# Patient Record
Sex: Male | Born: 1945 | Race: White | Hispanic: No | Marital: Married | Smoking: Current every day smoker
Health system: Southern US, Community
[De-identification: ages and names within clinical notes are randomized; demographics above are authoritative.]

## PROBLEM LIST (undated history)

## (undated) DIAGNOSIS — I714 Abdominal aortic aneurysm, without rupture, unspecified: Secondary | ICD-10-CM

## (undated) DIAGNOSIS — J449 Chronic obstructive pulmonary disease, unspecified: Secondary | ICD-10-CM

## (undated) DIAGNOSIS — I10 Essential (primary) hypertension: Secondary | ICD-10-CM

## (undated) DIAGNOSIS — C679 Malignant neoplasm of bladder, unspecified: Secondary | ICD-10-CM

## (undated) HISTORY — PX: PROSTATECTOMY: SHX69

## (undated) HISTORY — DX: Chronic obstructive pulmonary disease, unspecified: J44.9

## (undated) HISTORY — PX: ABDOMINAL AORTIC ANEURYSM REPAIR: SUR1152

---

## 2003-11-16 ENCOUNTER — Encounter (INDEPENDENT_AMBULATORY_CARE_PROVIDER_SITE_OTHER): Payer: Self-pay | Admitting: Specialist

## 2003-11-16 ENCOUNTER — Observation Stay (HOSPITAL_COMMUNITY): Admission: RE | Admit: 2003-11-16 | Discharge: 2003-11-17 | Payer: Self-pay | Admitting: Urology

## 2003-12-14 ENCOUNTER — Encounter (INDEPENDENT_AMBULATORY_CARE_PROVIDER_SITE_OTHER): Payer: Self-pay | Admitting: *Deleted

## 2003-12-14 ENCOUNTER — Inpatient Hospital Stay (HOSPITAL_COMMUNITY): Admission: RE | Admit: 2003-12-14 | Discharge: 2003-12-16 | Payer: Self-pay | Admitting: Urology

## 2004-11-23 ENCOUNTER — Ambulatory Visit: Admission: RE | Admit: 2004-11-23 | Discharge: 2005-01-07 | Payer: Self-pay | Admitting: Radiation Oncology

## 2004-12-26 ENCOUNTER — Ambulatory Visit (HOSPITAL_COMMUNITY): Admission: RE | Admit: 2004-12-26 | Discharge: 2004-12-26 | Payer: Self-pay | Admitting: Urology

## 2004-12-26 ENCOUNTER — Ambulatory Visit (HOSPITAL_BASED_OUTPATIENT_CLINIC_OR_DEPARTMENT_OTHER): Admission: RE | Admit: 2004-12-26 | Discharge: 2004-12-26 | Payer: Self-pay | Admitting: Urology

## 2004-12-26 ENCOUNTER — Encounter (INDEPENDENT_AMBULATORY_CARE_PROVIDER_SITE_OTHER): Payer: Self-pay | Admitting: Specialist

## 2005-07-10 ENCOUNTER — Encounter (INDEPENDENT_AMBULATORY_CARE_PROVIDER_SITE_OTHER): Payer: Self-pay | Admitting: Specialist

## 2005-07-10 ENCOUNTER — Ambulatory Visit (HOSPITAL_BASED_OUTPATIENT_CLINIC_OR_DEPARTMENT_OTHER): Admission: RE | Admit: 2005-07-10 | Discharge: 2005-07-10 | Payer: Self-pay | Admitting: Urology

## 2005-07-10 ENCOUNTER — Ambulatory Visit (HOSPITAL_COMMUNITY): Admission: RE | Admit: 2005-07-10 | Discharge: 2005-07-10 | Payer: Self-pay | Admitting: Urology

## 2005-08-14 ENCOUNTER — Encounter (HOSPITAL_COMMUNITY): Admission: RE | Admit: 2005-08-14 | Discharge: 2005-10-29 | Payer: Self-pay | Admitting: Urology

## 2005-09-17 ENCOUNTER — Ambulatory Visit: Admission: RE | Admit: 2005-09-17 | Discharge: 2005-10-25 | Payer: Self-pay | Admitting: Radiation Oncology

## 2007-05-06 ENCOUNTER — Encounter (INDEPENDENT_AMBULATORY_CARE_PROVIDER_SITE_OTHER): Payer: Self-pay | Admitting: Urology

## 2007-05-06 ENCOUNTER — Ambulatory Visit (HOSPITAL_BASED_OUTPATIENT_CLINIC_OR_DEPARTMENT_OTHER): Admission: RE | Admit: 2007-05-06 | Discharge: 2007-05-06 | Payer: Self-pay | Admitting: Urology

## 2007-11-09 ENCOUNTER — Encounter (INDEPENDENT_AMBULATORY_CARE_PROVIDER_SITE_OTHER): Payer: Self-pay | Admitting: Urology

## 2007-11-09 ENCOUNTER — Ambulatory Visit (HOSPITAL_BASED_OUTPATIENT_CLINIC_OR_DEPARTMENT_OTHER): Admission: RE | Admit: 2007-11-09 | Discharge: 2007-11-09 | Payer: Self-pay | Admitting: Urology

## 2011-03-19 NOTE — Op Note (Signed)
NAME:  Daniel King, Daniel King NO.:  [id number]   MEDICAL RECORD NO.:  [id number]          PATIENT TYPE:  AMB   LOCATION:  [facility]                         FACILITY:  [facility]   PHYSICIAN:  Valetta Fuller, M.D.  DATE OF BIRTH:  14-Aug-1946   DATE OF PROCEDURE:  05/06/2007  DATE OF DISCHARGE:                               OPERATIVE REPORT   PREOPERATIVE DIAGNOSIS:  History of transitional cell carcinoma bladder.   POSTOPERATIVE DIAGNOSIS:  History of transitional cell carcinoma  bladder.   PROCEDURE PERFORMED:  Cystoscopy with bladder washings, bladder biopsy  x3 with fulguration.   SURGEON:  Valetta Fuller, M.D.   ANESTHESIA:  General.   INDICATIONS:  Mr. Klem is a 65 year old male with a very complex  urologic history.  This includes a history of adenocarcinoma of the  prostate as well as transitional cell carcinoma of the bladder.  The  patient initially had a transitional cell carcinoma of the bladder noted  about 3 to 3-1/2 years ago.  The patient also subsequently developed a  recurrence and had some carcinoma in situ.  He has received an induction  course of BCG and has had several maintenance courses.  Recent  cystoscopy was unremarkable except one area of increased erythema and  bladder wall thickening near the junction of the left lateral wall and  posterior wall.  We felt that area needed to be biopsied.   TECHNIQUE AND FINDINGS:  The patient was brought to the operating room,  where he had successful induction of general anesthesia.  He was placed  in the lithotomy position and prepped and draped in the usual manner.  Initial cystoscopy revealed some narrowing in his penile urethra.  For  that reason we took out the cystoscope and used [employer] sounds to  perform urethral dilation from 18 to 24 [place] and then placed the  22-  French cystoscope without difficulty.  The patient has had his prostate  removed.  The urethra otherwise showed no evidence of any  abnormalities  and the bladder neck was open and clear.  The patient's bladder was  carefully inspected and appeared normal except again at the junction of  the posterior wall and left lateral wall.  This area was cold-cup  biopsied and then fulgurated.  Random biopsies were taken of both  lateral walls.  A bladder barbotage with saline was also done for  urinary cytology.  The patient appeared to tolerate the procedure well.  There were no obvious complications or difficulties.  He was brought to  the recovery room in stable condition.           ______________________________  Valetta Fuller, M.D.  Electronically Signed     DSG/MEDQ  D:  05/06/2007  T:  05/06/2007  Job:  [id number]

## 2011-03-19 NOTE — Op Note (Signed)
NAME:  CLEE, PANDIT NO.:  [id number]   MEDICAL RECORD NO.:  [id number]         PATIENT TYPE:  HAMB   LOCATION:                               FACILITY:  [facility]   PHYSICIAN:  Valetta Fuller, M.D.  DATE OF BIRTH:  1946-09-21   DATE OF PROCEDURE:  11/09/2007  DATE OF DISCHARGE:                               OPERATIVE REPORT   PREOPERATIVE DIAGNOSIS:  History of transitional cell carcinoma of the  bladder.   POSTOPERATIVE DIAGNOSIS:  History of transitional cell carcinoma of the  bladder.   PROCEDURE PERFORMED:  Cystoscopy with urethral dilation, bladder  washings for cytology and bladder biopsy with fulguration x3.   SURGEON:  Valetta Fuller, M.D.   ANESTHESIA:  General.   INDICATIONS:  Mr. Youngblood is a 65 year old male.  He has a history of  adenocarcinoma of the prostate as well as transitional cell carcinoma of  the bladder.  He has had noninvasive transitional cell carcinoma of the  bladder and has also had some carcinoma in situ.  The patient has fairly  recently finished a second 6-week course of BCG therapy.  This included  Intron A.  The patient was initially supposed to have repeat random  biopsies and washings back in November.  His initial surgery was  cancelled due to some hypertension, which has improved.  The patient  chose to delay his procedure for after the holidays.  He has no new  complaints today.  He presents now for reassessment of his urothelium.   With the stated findings, the patient was brought to the operating room  where he had successful induction of general anesthesia.  He was placed  in the lithotomy position and prepped and draped in the usual manner.  Initial cystoscopy revealed a bulbourethral stricture.  This was  actually minimal, but it did not allow for passage of the 22-French  scope.  I would estimate the stricture at around 20 [place].  He  underwent dilation from 20-24 [place], and then cystoscopy was performed  without difficulty.  The bladder neck appeared unremarkable.  His  bladder showed some mild trabecular change.  There were a couple of  areas of slight erythema, but really no evidence of obvious transitional  cell carcinoma of the bladder and nothing that was particularly  suspicious for carcinoma in situ.  Utilizing saline, bladder washings  were done for cytology.  At the completion of this, I performed 3 random  biopsies of his bladder.  I chose the area of maximum erythema in the  posterior wall and 2 random biopsies of the lateral wall, which were  sent separately.  Fulguration was then used on these sites.  The patient  appeared to tolerate the procedure well.  He was given lidocaine jelly  and a B & O suppository.  No other complications or problems arose.  He  was brought to the recovery room in stable condition.           ______________________________  Valetta Fuller, M.D.  Electronically Signed    DSG/MEDQ  D:  11/09/2007  T:  11/09/2007  Job:  [id number]

## 2011-03-22 NOTE — H&P (Signed)
NAME:  Daniel King, Daniel King NO.:  [id number]   MEDICAL RECORD NO.:  [id number]                   PATIENT TYPE:  INP   LOCATION:  NA                                   FACILITY:  [facility]   PHYSICIAN:  Valetta Fuller, M.D.               DATE OF BIRTH:  Apr 22, 1946   DATE OF ADMISSION:  DATE OF DISCHARGE:                                HISTORY & PHYSICAL   CHIEF COMPLAINT:  Adenocarcinoma of the prostate.   HISTORY OF PRESENT ILLNESS:  Daniel King is a 65 year old male.  He had a  strong family history of prostate cancer and recently had  PSA drawn which  was approximately 15.  This was repeated and found to be consistently  elevated.  He underwent rectal exam which revealed some firmness and  induration of the right aspect of the prostate.  His biopsies were positive  primarily on the right side with at least two-thirds of the biopsy material  being positive for a 3 + 4 = 7 adenocarcinoma of the prostate.  Because of  those numbers he did have some staging studies including a pelvic CT and  bone scan.  Bone scan showed no evidence of metastatic disease but a CT  showed what appeared to be a separate bladder mass.  The patient  subsequently had cystoscopy and was noted to have a papillary tumor  involving the left hemi trigone of the bladder.  We took him in to resect  the tumor.  This tumor was about 3 cm in size and there were several smaller  satellite tumors as well.  These turned out, however, to be noninvasive and  well differentiated.  A double J stent was left indwelling.  We felt that  given the superficial nature of his bladder cancer including a cystectomy in  his surgical procedure would be overly aggressive at this point.  The  patient has undergone extensive counseling with regard to treatment options  for his prostate cancer.  We specifically discussed aggressive radiation  approaches.  We felt he was not a candidate for seed implantation alone  but  would potentially be able to have seed implantation with external beam  radiation therapy and potentially hormonal therapy.  The patient understands  that he has a high likelihood of having microscopic disease outside his  prostate and that the chance of that is at least 50% if not closer to 75-80%  given these numbers.  He has, however, elected to have radical retropubic  prostatectomy.  The patient appears to understand the advantages and  disadvantages and all the risks of this surgery.  He presents now for that  procedure and hopefully routine postoperative care status post that  procedure.   PAST MEDICAL HISTORY:  His past medical history is relatively unremarkable.  The patient has not had routine medical care as a norm.  He does have an  extensive tobacco  use history and for that reason we sent him to [facility] for  medical clearance.  The patient did have a treadmill test and was cleared  from a cardiac standpoint to undergo radical retropubic prostatectomy.  He  does not have any other known systemic medical  illnesses.  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  He does drink several alcoholic  beverages a day and does have at least a 60 pack-year smoking history.  He  has no drug allergies.   REVIEW OF SYSTEMS:  His review of systems is positive for some mild voiding  symptoms, much better on the Urised and Flomax.   PHYSICAL EXAMINATION:  GENERAL:  He is a well-developed, well-nourished  male.  VITAL SIGNS:  He is afebrile with a blood pressure of 130/80 and a pulse of  92 at this time.  NECK:  Shows no JVD.  CHEST:  Clear to auscultation with the exception of a few scattered coarse  breath sounds.  ABDOMEN:  Flat and soft.  GENITOURINARY:  External genitalia reveals a normal penis, scrotum, testes,  and adnexal structures.  Prostate, again, is 1-2+ in size with diffuse  induration of the  right lobe of the prostate.  EXTREMITIES:  Showed no edema.   LABORATORY DATA:  His BMET and CBC were within normal limits with a  preoperative hemoglobin of 15.5.   ASSESSMENT:  Adenocarcinoma of the prostate.  The patient will undergo  pelvic exploration today with pelvic lymph node dissection and hopefully  radical retropubic prostatectomy.  He will be admitted hopefully for routine  postoperative care.                                               Valetta Fuller, M.D.    DSG/MEDQ  D:  12/14/2003  T:  12/14/2003  Job:  [id number]

## 2011-03-22 NOTE — Op Note (Signed)
NAME:  SERGE, MAIN NO.:  [id number]   MEDICAL RECORD NO.:  [id number]          PATIENT TYPE:  AMB   LOCATION:  [facility]                         FACILITY:  [facility]   PHYSICIAN:  Valetta Fuller, M.D.  DATE OF BIRTH:  09-10-1946   DATE OF PROCEDURE:  DATE OF DISCHARGE:                                 OPERATIVE REPORT   PREOPERATIVE DIAGNOSES:  1.  History of transitional cell carcinoma of the bladder.  2.  Prostate cancer.   POSTOPERATIVE DIAGNOSES:  1.  History of transitional cell carcinoma of the bladder.  2.  Prostate cancer.   PROCEDURE PERFORMED:  Cystoscopy with bladder biopsies x4 and bladder  barbotage for cytology with fulguration.   SURGEON:  Valetta Fuller, M.D.   ANESTHESIA:  General.   INDICATIONS:  Mr. Shepard is a 65 year old male who has a complex urologic  history.  He was diagnosed with adenocarcinoma of the prostate and underwent  radical retropubic prostatectomy approximately 18 months ago.  Unfortunately, his pathology revealed worrisome bilateral Gleason 4 + 3 = 7  tumor. Margins were negative but the patient's PSA never came down to 0.  It  has subsequently escalated and most recently was up to 1.3.  This suggests  that he probably has systemic disease.  Concurrently, at the time of his  diagnosis of his prostate cancer the patient also was diagnosed with  transitional cell carcinoma.  The original tumor was quite large.  It was  noninvasive and reasonably well differentiated. The patient subsequently had  follow-up cystoscopy which revealed a recurrent 5-mm exophytic tumor which  turned out to be low-grade papillary TCC.  There was also, however, some  associated carcinoma in situ.  For that reason, the patient has received a  second 6-week instillation of BCG status post his initial 6-week course  after the original tumor resection.  He presents now for reassessment  cystoscopy as well as additional biopsies.  He has not elected to  treat his  prostate cancer at this point. He has had no hematuria and no voiding  complaints.   TECHNIQUE OF PROCEDURE AND FINDINGS:  The patient was brought to the  operating room where he had successful induction of general endotracheal  anesthesia.  He was placed in the lithotomy position and prepped and draped  in the usual manner.  Cystoscopy revealed an unremarkable prostatic urethra  with a reasonably open bladder neck. The bladder itself showed some very  minimal diffuse erythema but nothing really concerning for carcinoma in  situ.  We elected to do a saline bladder barbotage for cytology.  We also  took random biopsies of the right and left lateral lobes of the bladder, the  trigone, and the dome.  These areas were then fulgurated.  The patient  appeared to tolerate the procedure well with no obvious complications.           ______________________________  Valetta Fuller, M.D.     DSG/MEDQ  D:  07/10/2005  T:  07/10/2005  Job:  [id number]

## 2011-03-22 NOTE — Op Note (Signed)
NAME:  Daniel King, MENO NO.:  [id number]   MEDICAL RECORD NO.:  [id number]                   PATIENT TYPE:  INP   LOCATION:  0346                                 FACILITY:  [facility]   PHYSICIAN:  Valetta Fuller, M.D.               DATE OF BIRTH:  1946-08-22   DATE OF PROCEDURE:  12/14/2003  DATE OF DISCHARGE:                                 OPERATIVE REPORT   PREOPERATIVE DIAGNOSES:  Adenocarcinoma of the prostate.   POSTOPERATIVE DIAGNOSES:  Adenocarcinoma of the prostate.   PROCEDURE:  Pelvic lymph node dissection and radical retropubic  prostatectomy.   SURGEON:  Valetta Fuller, M.D.   ASSISTANT:  Susanne Borders, MD   ANESTHESIA:  General endotracheal.   INDICATIONS FOR PROCEDURE:  [name] is a 65 year old male.  He had a  family history of prostate cancer but had not had any recent PSA testing. He  subsequently saw Dr. Arlyce Dice and a PSA was drawn and elevated significantly  in the 15-17 range. He was sent to our office where digital rectal exam  revealed a significant degree of induration and firmness in the right aspect  of the prostate but we did not feel that extended beyond the anatomic  confines of the prostate. Biopsy revealed a fairly large volume cancer and a  relatively small prostate on the right side with a Gleason 3+4=7 tumor.  A  bone scan and CT failed to show evidence of metastatic prostate cancer. The  patient unfortunately did have a separate bladder mass that turned out to be  a transitional cell carcinoma. We ended up resecting that prior to embarking  on definitive management for his prostate cancer with the thought that may  change our ultimate management. That tumor turned out to be well  differentiated and superficial with no evidence of invasion and therefore we  felt that removal of the bladder would be over aggressive. The patient  underwent extensive counseling with regard to treatment options for his  prostate  cancer.  He elected to have a radical retropubic prostatectomy.  He  appeared to understand the advantages as well as the potential disadvantages  of this approach and specifically understood the risk of sexual dysfunction,  incontinence as well as the other typical potential risks of any major  surgical procedure.  The patient now presents for his surgery.   TECHNIQUE AND FINDINGS:  The patient was placed in the supine position after  successful induction of general endotracheal anesthesia.  He was prepped and  draped in the usual manner. A Foley catheter was placed sterilely on the  table.  A standard lower midline incision was performed and the retropubic  space was entered with blunt dissective technique.  An omniretractor was  used for exposure with great care taken to pad the blades.  Attention was  turned to bilateral pelvic lymph node dissection.  Standard obturator  packets were  taken bilaterally.  Lymphatic channels and small vessels were  either clipped or cauterized. We did see one small firm node in the right  sided packet. We did not send this for frozen section.  Both packets were  sent for permanent sectioning only.  Obturator nerves were identified  bilaterally and preserved.   Attention was then turned to the prostatectomy.  The endopelvic fascia was  incised bilaterally and with blunt dissection technique, we were able to  identify the apex of the prostate.  Puboprostatic ligaments were then  sharply dissected. A right angled clamp was placed underneath the dorsal  vein complex above the urethra and tied with #1 Vicryl.  A suture ligature  was then also placed more distally.  The dorsal vein complex was transected.  The underlying urethra was then identified.  We felt that the patient could  potentially have a unilateral nerve sparing on the left side.  On the right,  we felt that wide excision of the bundle was indicated.  For that reason, we  took all the lateral  tissue adjacent to the urethra on the right and widely  excised the neurovascular bundle on that side. On the left, we were able to  separate the lateral aspect of the tissue from the urethra which was then  encircled. The anterior aspect of the urethra was transected and the Foley  catheter was identified and then removed and brought out of the incision  exposing the posterior aspect of the urethra. A single suture was placed in  the urethral stump at the 12 o'clock position to aid their reanastomosis at  a later time.  2-0 Vicryl was used for that. Once the urethra was completely  transected, we used some blunt and sharp dissective technique to take off  some rectourethralis. The posterior plane of the prostate off the rectum was  then easily established. A lateral fascia of the prostate was taken widely  on the right and closer to the prostate on the left pushing down the  neurovascular bundle on the left side. In the midline, the seminal vesicles  were then identified allowing [place] to establish the proper plane of the  pedicle to the prostate. The pedicles were taken with right angled locking  clips and transected. We made a small incision in Denonvillier's fascia in  between the seminal vesicles. The vas were easily identified, separated from  the seminal vesicles, clipped and transected. The seminal vesicles were then  dissected out.   Attention was then turned to the dissection of the prostate off the bladder  neck. A combination of sharp and blunt dissective technique was utilized. We  decided to take a little margin of bladder neck given his advanced  potentially cancer.  The patient also had a double J stent left indwelling  from his previous transurethral resection of his bladder tumor.  At the  completion of that dissection, the bladder neck was preserved but we did  take a small margin and therefore we had approximately a 2 cm opening in the bladder neck. We were well away from  the trigone and ureteral orifices. The  entire prostatic specimen was then removed.  The mucosa of the bladder neck  was then everted with some 4-0 Vicryl. We did need to place a couple of  tightening sutures at the 6 o'clock position in a tennis racquet type of  manner to close the bladder neck to approximately the size of the tip of the  little finger.  The anastomosis was then done utilizing a Greenwald sound.  We placed five anastomotic sutures in the urethral stump and then placed  them in the bladder neck. These were tied over a 22 French Foley catheter.  The bladder was then irrigated and the anastomosis appeared to be tight  without obvious extravasation or leakage.  A Jackson-Pratt drain was placed  through a separate stab incision and placed in the retropubic space.  The  fascia was closed with a running PDS suture and the skin was closed with  clips.  The patient appeared to tolerate the procedure well and there were  no obvious complications.                                               Valetta Fuller, M.D.    DSG/MEDQ  D:  12/15/2003  T:  12/15/2003  Job:  [id number]

## 2011-03-22 NOTE — Op Note (Signed)
NAME:  Daniel King, Daniel King NO.:  [id number]   MEDICAL RECORD NO.:  [id number]                   PATIENT TYPE:  AMB   LOCATION:  DAY                                  FACILITY:  [facility]   PHYSICIAN:  Valetta Fuller, M.D.               DATE OF BIRTH:  [birth year]   DATE OF PROCEDURE:  11/16/2003  DATE OF DISCHARGE:                                 OPERATIVE REPORT   PREOPERATIVE DIAGNOSES:  1. Transitional cell carcinoma of the bladder.  2. Adenocarcinoma of the prostate.   POSTOPERATIVE DIAGNOSES:  1. Transitional cell carcinoma of the bladder.  2. Adenocarcinoma of the prostate.   PROCEDURE:  Cystoscopy, TURBT, double J stent placement in the left kidney.   SURGEON:  Valetta Fuller, M.D.   ANESTHESIA:  General.   INDICATIONS FOR PROCEDURE:  [name] is a 65 year old male.  He  presented with an elevated PSA of 15 to 17.  Unfortunately biopsies did  reveal adenocarcinoma of the prostate with a Gleason score of 3+4=7.  The  patient had a bone scan and CT which failed to reveal any evidence of  metastatic adenocarcinoma of the prostate.  Unfortunately he appeared to  have a mass in his bladder separate from the prostate consistent with a  transitional cell carcinoma of the bladder.  Indeed at the time of  cystoscopy in our office, he had about a 3 cm tumor involving the left  hemitrigone.  There were several satellite lesions between 1 and 2 cm in  size and some carpeting of transitional cell carcinoma around the entire  left ureteral orifice.  All told about 4-5 cm of bladder wall were involved.  This appeared to be unlikely to be muscle invasive but it was difficult to  say with certainty. We felt that the stage of his bladder cancer could  potentially alter greatly our management options with regard to his prostate  cancer and therefore elected to proceed with transurethral resection of  bladder tumor.  He understands that a double J stent is  probably going to be  likely.  He and the family appear to understand the rationale for this  approach and full informed consent was obtained.   TECHNIQUE:  The patient was brought to the operating room where he had  successful induction of general anesthesia. He was placed in lithotomy  position, prepped and draped in the usual manner. Cystoscopy again revealed  multiple tumors in the left hemitrigone. There was papillary transitional  cell carcinoma really circling the entire left ureteral orifice. The largest  tumor which was about 3 cm was just lateral to the ureteral orifice. There  were several again satellite lesions as well.  There was nothing involving  the other portions of the bladder.  We went ahead and placed a guidewire up  to the left kidney with fluoroscopic guidance. A stent was then placed to  protect  the ureteral orifice.  A continuous flow resectoscope was then  utilized. The entire tumor was resected down to muscle fibers. Some of the  smaller satellite tumors were fulgurated and then the carpeted area of  papillary tumor especially that surrounding the ureteral orifice was  fulgurated.  The double J stent was left indwelling. At the completion of  the  procedure, there was no evidence of bladder injury or perforation. There was  no visible remaining tumor and it appeared to be grossly resected.  Hemostasis was excellent and the urine was crystal clear.  The patient  appeared to tolerate the procedure well.  He was brought to the recovery  room in stable condition.                                               Valetta Fuller, M.D.    DSG/MEDQ  D:  11/16/2003  T:  11/16/2003  Job:  [id number]

## 2011-03-22 NOTE — Op Note (Signed)
NAME:  Daniel, King NO.:  [id number]   MEDICAL RECORD NO.:  [id number]          PATIENT TYPE:  AMB   LOCATION:  [facility]                         FACILITY:  [facility]   PHYSICIAN:  Valetta Fuller, M.D.  DATE OF BIRTH:  1946/06/07   DATE OF PROCEDURE:  12/26/2004  DATE OF DISCHARGE:                                 OPERATIVE REPORT   PREOPERATIVE DIAGNOSIS:  Recurrent transitional cell carcinoma of the  bladder.   POSTOPERATIVE DIAGNOSIS:  Recurrent transitional cell carcinoma of the  bladder.   PROCEDURES PERFORMED:  1.  Cystoscopy.  2.  Bladder biopsies x 3 and fulguration.   SURGEON:  Valetta Fuller, M.D.   ANESTHESIA:  General.   INDICATIONS:  [name] is a 65 year old male who has had a complex  urologic history over the last 12-18 months.  He was diagnosed with  concurrent bilateral adenocarcinoma of the prostate and underwent a radical  retropubic prostatectomy approximately a year ago.  At the time of his  diagnosis, he was also noted to have a rather large transitional cell  carcinoma of the bladder.  The tumor was large, but it was noninvasive and  well differentiated.  There were several satellite lesions and for that  reason the patient underwent six weeks of instillations of BCG.  Recent  followup showed what appeared to be a 3-5 mm papillary tumor right at the  dome of his bladder near the bladder neck.  The patient presents now for  cold cup biopsy of this lesion and reassessment of his complete bladder with  biopsy and fulguration.   TECHNIQUE AND FINDINGS:  The patient was brought to the operating room where  he had successful induction of general anesthesia.  He was placed in the  lithotomy position and prepped and draped in the usual manner.  At the time  of cystoscopy, there was no evidence of urethral stricture.  The bladder  neck anastomosis was quite wide open.  The sphincter apparatus looked normal  and the patient has had no  incontinence.  The lesion on the dome of his  bladder was again noted to be a papillary noninvasive-appearing tumor no  bigger than 3-4 mm.  There was also an area of increased erythema on the  junction of the left lateral and posterior wall of his bladder.  We removed  the small papillary tumor with one single cold cup biopsy and this area was  fulgurated.  We took two biopsies of the erythematous area, which could be  carcinoma in situ and that area was copiously fulguration as well.  The  patient appeared to tolerate the procedure well.  There were no obvious  complications.  He was brought to the recovery room in stable condition.      DSG/MEDQ  D:  12/26/2004  T:  12/26/2004  Job:  [id number]

## 2011-07-24 LAB — POCT HEMOGLOBIN-HEMACUE
Hemoglobin: 18.4 — ABNORMAL HIGH
Operator id: 114531

## 2011-08-20 LAB — POCT HEMOGLOBIN-HEMACUE
Hemoglobin: 17.5 — ABNORMAL HIGH
Operator id: 133231

## 2012-08-11 ENCOUNTER — Emergency Department (HOSPITAL_COMMUNITY)
Admission: EM | Admit: 2012-08-11 | Discharge: 2012-08-11 | Disposition: A | Discharge status: Home / Self Care | Payer: Medicare Other | Attending: Emergency Medicine | Admitting: Emergency Medicine

## 2012-08-11 ENCOUNTER — Emergency Department (HOSPITAL_COMMUNITY): Payer: Medicare Other

## 2012-08-11 ENCOUNTER — Encounter (HOSPITAL_COMMUNITY): Payer: Self-pay | Admitting: *Deleted

## 2012-08-11 DIAGNOSIS — S42009A Fracture of unspecified part of unspecified clavicle, initial encounter for closed fracture: Secondary | ICD-10-CM

## 2012-08-11 DIAGNOSIS — W010XXA Fall on same level from slipping, tripping and stumbling without subsequent striking against object, initial encounter: Secondary | ICD-10-CM | POA: Insufficient documentation

## 2012-08-11 DIAGNOSIS — S42033A Displaced fracture of lateral end of unspecified clavicle, initial encounter for closed fracture: Secondary | ICD-10-CM | POA: Insufficient documentation

## 2012-08-11 DIAGNOSIS — Y92009 Unspecified place in unspecified non-institutional (private) residence as the place of occurrence of the external cause: Secondary | ICD-10-CM | POA: Insufficient documentation

## 2012-08-11 HISTORY — DX: Abdominal aortic aneurysm, without rupture: I71.4

## 2012-08-11 HISTORY — DX: Essential (primary) hypertension: I10

## 2012-08-11 HISTORY — DX: Malignant neoplasm of bladder, unspecified: C67.9

## 2012-08-11 HISTORY — DX: Abdominal aortic aneurysm, without rupture, unspecified: I71.40

## 2012-08-11 MED ORDER — ONDANSETRON 8 MG PO TBDP
8.0000 mg | ORAL_TABLET | Freq: Once | ORAL | Status: AC
  Administered 2012-08-11: 8 mg via ORAL
  Filled 2012-08-11: qty 1

## 2012-08-11 MED ORDER — OXYCODONE-ACETAMINOPHEN 5-325 MG PO TABS: 1.0000 | ORAL_TABLET | Freq: Four times a day (QID) | ORAL | Status: DC | PRN

## 2012-08-11 MED ORDER — HYDROMORPHONE HCL PF 2 MG/ML IJ SOLN
2.0000 mg | Freq: Once | INTRAMUSCULAR | Status: AC
  Administered 2012-08-11: 2 mg via INTRAMUSCULAR
  Filled 2012-08-11: qty 1

## 2012-08-11 NOTE — ED Provider Notes (Signed)
Medical screening examination/treatment/procedure(s) were performed by non-physician practitioner and as supervising physician I was immediately available for consultation/collaboration.   Marceil Welp Y. Angelys Yetman, MD 08/11/12 2335 
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
## 2014-04-28 ENCOUNTER — Other Ambulatory Visit (HOSPITAL_COMMUNITY): Payer: Self-pay | Admitting: Urology

## 2014-04-28 DIAGNOSIS — C61 Malignant neoplasm of prostate: Secondary | ICD-10-CM

## 2014-05-17 ENCOUNTER — Encounter (HOSPITAL_COMMUNITY)
Admission: RE | Admit: 2014-05-17 | Discharge: 2014-05-17 | Disposition: A | Discharge status: Home / Self Care | Payer: Medicare Other | Source: Ambulatory Visit | Attending: Urology | Admitting: Urology

## 2014-05-17 DIAGNOSIS — C61 Malignant neoplasm of prostate: Secondary | ICD-10-CM

## 2014-05-17 MED ORDER — TECHNETIUM TC 99M MEDRONATE IV KIT
27.0000 | PACK | Freq: Once | INTRAVENOUS | Status: AC | PRN
  Administered 2014-05-17: 27 via INTRAVENOUS

## 2016-08-12 ENCOUNTER — Encounter (HOSPITAL_COMMUNITY): Payer: Self-pay | Admitting: *Deleted

## 2016-08-12 ENCOUNTER — Emergency Department (HOSPITAL_BASED_OUTPATIENT_CLINIC_OR_DEPARTMENT_OTHER)
Admit: 2016-08-12 | Discharge: 2016-08-12 | Disposition: A | Discharge status: Home / Self Care | Payer: Medicare Other | Attending: Emergency Medicine | Admitting: Emergency Medicine

## 2016-08-12 ENCOUNTER — Emergency Department (HOSPITAL_COMMUNITY)
Admission: EM | Admit: 2016-08-12 | Discharge: 2016-08-12 | Disposition: A | Discharge status: Home / Self Care | Payer: Medicare Other | Attending: Dermatology | Admitting: Dermatology

## 2016-08-12 DIAGNOSIS — Z5321 Procedure and treatment not carried out due to patient leaving prior to being seen by health care provider: Secondary | ICD-10-CM | POA: Diagnosis not present

## 2016-08-12 DIAGNOSIS — Z79899 Other long term (current) drug therapy: Secondary | ICD-10-CM | POA: Diagnosis not present

## 2016-08-12 DIAGNOSIS — M79606 Pain in leg, unspecified: Secondary | ICD-10-CM | POA: Insufficient documentation

## 2016-08-12 DIAGNOSIS — Z8551 Personal history of malignant neoplasm of bladder: Secondary | ICD-10-CM | POA: Diagnosis not present

## 2016-08-12 DIAGNOSIS — F17210 Nicotine dependence, cigarettes, uncomplicated: Secondary | ICD-10-CM | POA: Diagnosis not present

## 2016-08-12 DIAGNOSIS — I10 Essential (primary) hypertension: Secondary | ICD-10-CM | POA: Insufficient documentation

## 2016-08-12 DIAGNOSIS — M79609 Pain in unspecified limb: Secondary | ICD-10-CM

## 2016-08-12 NOTE — ED Notes (Signed)
Pt handed RN his BP cuff.  This Rn explained that she was working on a room for him.  Pt continued walking, did not want to stop to listen to RN.  Exited the premises.

## 2016-08-12 NOTE — ED Notes (Signed)
Report to GREG RN.PT.BLOODPRESSURE WAS 170/109 IN RIGHT ARM,LEFT ARM WAS 190/135.

## 2016-08-12 NOTE — Progress Notes (Signed)
VASCULAR LAB PRELIMINARY  PRELIMINARY  PRELIMINARY  PRELIMINARY  Left lower extremity venous duplex completed.    Preliminary report:  Left:  No evidence of DVT, superficial thrombosis, or Baker's cyst.  Daniel King, RVS 08/12/2016, 2:10 PM

## 2016-08-12 NOTE — ED Notes (Signed)
DVT study completed and resulted.

## 2016-08-12 NOTE — ED Triage Notes (Signed)
Pt sent here from [facility] for LLE pain x 1 week.  Pain is constant and is diminished with tramadol.

## 2016-08-12 NOTE — ED Notes (Signed)
Pt came up requesting information from RN about wait times.  This RN explained that she would talk to a provider as soon as she could.  Pt sts "I don't have time to wait, I need to get home and take medications".  This RN explained that we could help him with that here, but that it would be a good idea to be seen by the provider.  Pt states he will wait.

## 2016-11-12 DIAGNOSIS — Z4801 Encounter for change or removal of surgical wound dressing: Secondary | ICD-10-CM | POA: Diagnosis not present

## 2016-11-12 DIAGNOSIS — L02214 Cutaneous abscess of groin: Secondary | ICD-10-CM | POA: Diagnosis not present

## 2016-11-12 DIAGNOSIS — T814XXD Infection following a procedure, subsequent encounter: Secondary | ICD-10-CM | POA: Diagnosis not present

## 2016-11-12 DIAGNOSIS — I10 Essential (primary) hypertension: Secondary | ICD-10-CM | POA: Diagnosis not present

## 2016-11-12 DIAGNOSIS — F17210 Nicotine dependence, cigarettes, uncomplicated: Secondary | ICD-10-CM | POA: Diagnosis not present

## 2016-11-12 DIAGNOSIS — Z7982 Long term (current) use of aspirin: Secondary | ICD-10-CM | POA: Diagnosis not present

## 2016-11-12 DIAGNOSIS — J449 Chronic obstructive pulmonary disease, unspecified: Secondary | ICD-10-CM | POA: Diagnosis not present

## 2016-11-15 DIAGNOSIS — L02214 Cutaneous abscess of groin: Secondary | ICD-10-CM | POA: Diagnosis not present

## 2016-11-15 DIAGNOSIS — Z4801 Encounter for change or removal of surgical wound dressing: Secondary | ICD-10-CM | POA: Diagnosis not present

## 2016-11-15 DIAGNOSIS — J449 Chronic obstructive pulmonary disease, unspecified: Secondary | ICD-10-CM | POA: Diagnosis not present

## 2016-11-15 DIAGNOSIS — T814XXD Infection following a procedure, subsequent encounter: Secondary | ICD-10-CM | POA: Diagnosis not present

## 2016-11-15 DIAGNOSIS — F17210 Nicotine dependence, cigarettes, uncomplicated: Secondary | ICD-10-CM | POA: Diagnosis not present

## 2016-11-15 DIAGNOSIS — I10 Essential (primary) hypertension: Secondary | ICD-10-CM | POA: Diagnosis not present

## 2016-11-19 DIAGNOSIS — T814XXD Infection following a procedure, subsequent encounter: Secondary | ICD-10-CM | POA: Diagnosis not present

## 2016-11-19 DIAGNOSIS — F17210 Nicotine dependence, cigarettes, uncomplicated: Secondary | ICD-10-CM | POA: Diagnosis not present

## 2016-11-19 DIAGNOSIS — I10 Essential (primary) hypertension: Secondary | ICD-10-CM | POA: Diagnosis not present

## 2016-11-19 DIAGNOSIS — Z4801 Encounter for change or removal of surgical wound dressing: Secondary | ICD-10-CM | POA: Diagnosis not present

## 2016-11-19 DIAGNOSIS — L02214 Cutaneous abscess of groin: Secondary | ICD-10-CM | POA: Diagnosis not present

## 2016-11-19 DIAGNOSIS — J449 Chronic obstructive pulmonary disease, unspecified: Secondary | ICD-10-CM | POA: Diagnosis not present

## 2016-11-23 DIAGNOSIS — F17210 Nicotine dependence, cigarettes, uncomplicated: Secondary | ICD-10-CM | POA: Diagnosis not present

## 2016-11-23 DIAGNOSIS — Z4801 Encounter for change or removal of surgical wound dressing: Secondary | ICD-10-CM | POA: Diagnosis not present

## 2016-11-23 DIAGNOSIS — J449 Chronic obstructive pulmonary disease, unspecified: Secondary | ICD-10-CM | POA: Diagnosis not present

## 2016-11-23 DIAGNOSIS — L02214 Cutaneous abscess of groin: Secondary | ICD-10-CM | POA: Diagnosis not present

## 2016-11-23 DIAGNOSIS — I10 Essential (primary) hypertension: Secondary | ICD-10-CM | POA: Diagnosis not present

## 2016-11-23 DIAGNOSIS — T814XXD Infection following a procedure, subsequent encounter: Secondary | ICD-10-CM | POA: Diagnosis not present

## 2016-11-26 DIAGNOSIS — F17210 Nicotine dependence, cigarettes, uncomplicated: Secondary | ICD-10-CM | POA: Diagnosis not present

## 2016-11-26 DIAGNOSIS — I10 Essential (primary) hypertension: Secondary | ICD-10-CM | POA: Diagnosis not present

## 2016-11-26 DIAGNOSIS — T814XXD Infection following a procedure, subsequent encounter: Secondary | ICD-10-CM | POA: Diagnosis not present

## 2016-11-26 DIAGNOSIS — L02214 Cutaneous abscess of groin: Secondary | ICD-10-CM | POA: Diagnosis not present

## 2016-11-26 DIAGNOSIS — Z4801 Encounter for change or removal of surgical wound dressing: Secondary | ICD-10-CM | POA: Diagnosis not present

## 2016-11-26 DIAGNOSIS — J449 Chronic obstructive pulmonary disease, unspecified: Secondary | ICD-10-CM | POA: Diagnosis not present

## 2017-04-23 DIAGNOSIS — C61 Malignant neoplasm of prostate: Secondary | ICD-10-CM | POA: Diagnosis not present

## 2017-04-30 DIAGNOSIS — C61 Malignant neoplasm of prostate: Secondary | ICD-10-CM | POA: Diagnosis not present

## 2017-04-30 DIAGNOSIS — Z8551 Personal history of malignant neoplasm of bladder: Secondary | ICD-10-CM | POA: Diagnosis not present

## 2017-04-30 DIAGNOSIS — C7951 Secondary malignant neoplasm of bone: Secondary | ICD-10-CM | POA: Diagnosis not present

## 2017-04-30 DIAGNOSIS — Z5111 Encounter for antineoplastic chemotherapy: Secondary | ICD-10-CM | POA: Diagnosis not present

## 2017-06-24 ENCOUNTER — Ambulatory Visit (INDEPENDENT_AMBULATORY_CARE_PROVIDER_SITE_OTHER): Payer: Medicare Other | Admitting: Orthopedic Surgery

## 2017-06-24 ENCOUNTER — Encounter (INDEPENDENT_AMBULATORY_CARE_PROVIDER_SITE_OTHER): Payer: Self-pay | Admitting: Orthopedic Surgery

## 2017-06-24 ENCOUNTER — Ambulatory Visit (INDEPENDENT_AMBULATORY_CARE_PROVIDER_SITE_OTHER): Payer: Medicare Other

## 2017-06-24 VITALS — BP 103/66 | HR 86 | Resp 14 | Ht 70.0 in | Wt 165.0 lb

## 2017-06-24 DIAGNOSIS — M25572 Pain in left ankle and joints of left foot: Secondary | ICD-10-CM

## 2017-06-24 DIAGNOSIS — S82842A Displaced bimalleolar fracture of left lower leg, initial encounter for closed fracture: Secondary | ICD-10-CM

## 2017-06-24 NOTE — Progress Notes (Signed)
Office Visit Note   Patient: Daniel King           Date of Birth: Oct 19, 1946           MRN: [id number] Visit Date: 06/24/2017              Requested by: No referring provider defined for this encounter. PCP: Henreitta Cea, MD   Assessment & Plan: Visit Diagnoses:  1. Fracture, ankle closed, bimalleolar, left, initial encounter   2. Pain in left ankle and joints of left foot     Plan: #1: Equalizer boot to the left ankle #2: If his pain worsens he is to be seen immediately otherwise in 2 weeks.  Follow-Up Instructions: Return in about 2 weeks (around 07/08/2017).   Orders:  Orders Placed This Encounter  Procedures  . XR Ankle Complete Left   No orders of the defined types were placed in this encounter.     Procedures: No procedures performed   Clinical Data: No additional findings.   Subjective: Chief Complaint  Patient presents with  . Left Ankle - Injury, Pain, Edema, Weakness    Daniel King is a 62 y o that presents with L ankle pain after his B/P dropped and he became lightheaded and had syncope episode 2 weeks ago. Ecchymosis initially but none now.    Daniel King is a very pleasant 71 year old male who is seen today for evaluation of his left ankle. Probably 2 weeks ago he states that he began having left ankle pain after his blood pressure dropped and he became lightheaded and had a syncopal episode. It was quite painful for him when he ambulated. He also started developing some ecchymosis about the ankle. Over the past 2 weeks he states this improved some but he continues to have pain and discomfort. Not really sure what happened when he had a syncopal episode other than he was found on the ground. He continues to have pain at the ankle both medially and laterally. Seen today for evaluation of his left ankle.       Review of Systems  Constitutional: Negative for fatigue.  HENT: Negative for hearing loss.   Respiratory: Negative for apnea, chest  tightness and shortness of breath.   Cardiovascular: Negative for chest pain, palpitations and leg swelling.  Gastrointestinal: Negative for blood in stool, constipation and diarrhea.  Genitourinary: Negative for difficulty urinating.  Musculoskeletal: Negative for arthralgias, back pain, joint swelling, myalgias, neck pain and neck stiffness.  Neurological: Positive for weakness. Negative for numbness and headaches.  Hematological: Does not bruise/bleed easily.  Psychiatric/Behavioral: Negative for sleep disturbance. The patient is not nervous/anxious.      Objective: Vital Signs: BP 103/66   Pulse 86   Resp 14   Ht 5\' 10"  (1.778 m)   Wt 165 lb (74.8 kg)   BMI 23.68 kg/m   Physical Exam  Constitutional: He is oriented to person, place, and time. He appears well-developed and well-nourished.  HENT:  Head: Normocephalic and atraumatic.  Eyes: Pupils are equal, round, and reactive to light. EOM are normal.  Pulmonary/Chest: Effort normal.  Neurological: He is alert and oriented to person, place, and time.  Skin: Skin is warm and dry.  Psychiatric: He has a normal mood and affect. His behavior is normal. Judgment and thought content normal.    Ortho Exam  Today he has tenderness palpation both medially and laterally at the malleoli. He's got smooth motion of the ankle. She got good sensation. Dorsalis  pedis pulses 1+. Specialty Comments:  No specialty comments available.  Imaging: Xr Ankle Complete Left  Result Date: 06/24/2017 Three-view x-ray of the left ankle reveals a distal medial malleolar fracture still in good position. Also has at the level of the mortise a distal fibular fracture still in good position.    PMFS History: There are no active problems to display for this patient.  Past Medical History:  Diagnosis Date  . AAA (abdominal aortic aneurysm) ([place])   . Bladder cancer [facility])    prostate  . Hypertension     No family history on file.  Past Surgical  History:  Procedure Laterality Date  . ABDOMINAL AORTIC ANEURYSM REPAIR    . PROSTATECTOMY     Social History   Occupational History  . Not on file.   Social History Main Topics  . Smoking status: Current Every Day Smoker    Packs/day: 2.00    Types: Cigarettes  . Smokeless tobacco: Never Used  . Alcohol use 3.0 oz/week    5 Shots of liquor per week  . Drug use: No  . Sexual activity: Not on file

## 2017-07-21 ENCOUNTER — Ambulatory Visit (INDEPENDENT_AMBULATORY_CARE_PROVIDER_SITE_OTHER): Payer: Medicare Other | Admitting: Orthopaedic Surgery

## 2017-07-21 ENCOUNTER — Ambulatory Visit (INDEPENDENT_AMBULATORY_CARE_PROVIDER_SITE_OTHER): Payer: Medicare Other

## 2017-07-21 ENCOUNTER — Encounter (INDEPENDENT_AMBULATORY_CARE_PROVIDER_SITE_OTHER): Payer: Self-pay | Admitting: Orthopaedic Surgery

## 2017-07-21 VITALS — BP 133/102 | HR 99 | Resp 12 | Ht 70.0 in | Wt 165.0 lb

## 2017-07-21 DIAGNOSIS — M25572 Pain in left ankle and joints of left foot: Secondary | ICD-10-CM | POA: Diagnosis not present

## 2017-07-21 NOTE — Progress Notes (Signed)
Office Visit Note   Patient: Daniel King           Date of Birth: [birth year]           MRN: [id number] Visit Date: 07/21/2017              Requested by: Daniel Cea, MD No address on file PCP: Daniel Cea, MD   Assessment & Plan: Visit Diagnoses:  1. Pain in left ankle and joints of left foot     Plan: Essentially nondisplaced fractures of medial and lateral malleolar  left ankle. Patient is asymptomatic both in and out of the equalizer boot. Actually now approximately 22 weeks old. Patient is deconditioned and smokes and will require immobilization for at least another month  Follow-Up Instructions: Return in about 1 month (around 08/20/2017).   Orders:  Orders Placed This Encounter  Procedures  . XR Ankle Complete Left   No orders of the defined types were placed in this encounter.     Procedures: No procedures performed   Clinical Data: No additional findings.   Subjective: Chief Complaint  Patient presents with  . Left Ankle - Fracture    Daniel King is a 71 y o that is 1 mo from Left ankle fracture. He has been wearing the equalizer boot and that has really helped his pain.  Initial onset of left ankle pain as previously identified when he passed out and injured his ankle approximately 6 weeks ago. Has been wearing the equalizer boot and relates she's not having "any pain"  HPI  Review of Systems  Constitutional: Negative for fatigue.  HENT: Negative for hearing loss.   Respiratory: Negative for apnea, chest tightness and shortness of breath.   Cardiovascular: Negative for chest pain, palpitations and leg swelling.  Gastrointestinal: Negative for blood in stool, constipation and diarrhea.  Genitourinary: Negative for difficulty urinating.  Musculoskeletal: Negative for arthralgias, back pain, joint swelling, myalgias, neck pain and neck stiffness.  Neurological: Positive for light-headedness. Negative for weakness, numbness and headaches.    Hematological: Does not bruise/bleed easily.  Psychiatric/Behavioral: Negative for sleep disturbance. The patient is not nervous/anxious.      Objective: Vital Signs: BP (!) 133/102   Pulse 99   Resp 12   Ht 5\' 10"  (1.778 m)   Wt 165 lb (74.8 kg)   BMI 23.68 kg/m   Physical Exam left ankle with minimal tenderness over the lateral and medial malleolus. Mild swelling. Skin intact. Good pulses. Sensory exam intact. Skin intact. No obvious deformity.  Ortho Exam  Specialty Comments:  No specialty comments available.  Imaging: Xr Ankle Complete Left  Result Date: 07/21/2017 Terms of the left ankle were obtained in 3 projections and compared the films that were performed in August. Ankle mortise is intact. Fracture of the medial and  lateral malleolar are identified. Fractures are more distinct than they were last month but still in good position. Patient is asymptomatic.    PMFS History: There are no active problems to display for this patient.  Past Medical History:  Diagnosis Date  . AAA (abdominal aortic aneurysm) ([place])   . Bladder cancer [facility])    prostate  . Hypertension     History reviewed. No pertinent family history.  Past Surgical History:  Procedure Laterality Date  . ABDOMINAL AORTIC ANEURYSM REPAIR    . PROSTATECTOMY     Social History   Occupational History  . Not on file.   Social History Main Topics  .  Smoking status: Current Every Day Smoker    Packs/day: 2.00    Types: Cigarettes  . Smokeless tobacco: Never Used  . Alcohol use 3.0 oz/week    5 Shots of liquor per week  . Drug use: No  . Sexual activity: Not on file

## 2017-07-28 ENCOUNTER — Ambulatory Visit (INDEPENDENT_AMBULATORY_CARE_PROVIDER_SITE_OTHER): Payer: Medicare Other | Admitting: Orthopaedic Surgery

## 2017-08-22 ENCOUNTER — Ambulatory Visit (INDEPENDENT_AMBULATORY_CARE_PROVIDER_SITE_OTHER): Payer: Medicare Other | Admitting: Orthopaedic Surgery

## 2017-08-25 ENCOUNTER — Ambulatory Visit (INDEPENDENT_AMBULATORY_CARE_PROVIDER_SITE_OTHER): Payer: Medicare Other | Admitting: Orthopaedic Surgery

## 2017-08-25 DIAGNOSIS — M79672 Pain in left foot: Secondary | ICD-10-CM

## 2017-08-25 NOTE — Progress Notes (Signed)
   Office Visit Note   Patient: Daniel King           Date of Birth: 1946/03/25           MRN: [id number] Visit Date: 08/25/2017              Requested by: Henreitta Cea, MD No address on file PCP: Henreitta Cea, MD   Assessment & Plan: Visit Diagnoses:  1. Left foot pain     Plan: Proximally 11 weeks status post nondisplaced bimalleolar fracture left ankle and doing well. No significant pain. Further x-rays not necessary. There is no obvious deformity. We'll apply an ASO ankle support which she should use in the next 4-6 weeks particularly when he plays golf. we'll see back as needed  Follow-Up Instructions: Return if symptoms worsen or fail to improve.   Orders:  No orders of the defined types were placed in this encounter.  No orders of the defined types were placed in this encounter.     Procedures: No procedures performed   Clinical Data: No additional findings.   Subjective: Chief Complaint  Patient presents with  . Left Ankle - Pain  Has been walking full weightbearing in equalizer boot for the past month without any problems  HPI  Review of Systems  Constitutional: Negative for fatigue.  HENT: Negative for hearing loss.   Respiratory: Negative for apnea, chest tightness and shortness of breath.   Cardiovascular: Negative for chest pain, palpitations and leg swelling.  Gastrointestinal: Negative for blood in stool, constipation and diarrhea.  Genitourinary: Negative for difficulty urinating.  Musculoskeletal: Negative for arthralgias, back pain, joint swelling, myalgias, neck pain and neck stiffness.  Neurological: Negative for weakness, numbness and headaches.  Hematological: Does not bruise/bleed easily.  Psychiatric/Behavioral: Negative for sleep disturbance. The patient is not nervous/anxious.      Objective: Vital Signs: There were no vitals taken for this visit.  Physical Exam  Ortho Exam exam out of the equalizer boot left ankle.  No pain along either lateral or medial malleolus. No edema. Skin intact. Neurovascular exam intact.  Specialty Comments:  No specialty comments available.  Imaging: No results found.   PMFS History: There are no active problems to display for this patient.  Past Medical History:  Diagnosis Date  . AAA (abdominal aortic aneurysm) ([place])   . Bladder cancer [facility])    prostate  . Hypertension     No family history on file.  Past Surgical History:  Procedure Laterality Date  . ABDOMINAL AORTIC ANEURYSM REPAIR    . PROSTATECTOMY     Social History   Occupational History  . Not on file.   Social History Main Topics  . Smoking status: Current Every Day Smoker    Packs/day: 2.00    Types: Cigarettes  . Smokeless tobacco: Never Used  . Alcohol use 3.0 oz/week    5 Shots of liquor per week  . Drug use: No  . Sexual activity: Not on file

## 2017-10-24 DIAGNOSIS — C61 Malignant neoplasm of prostate: Secondary | ICD-10-CM | POA: Diagnosis not present

## 2017-11-03 DIAGNOSIS — C7951 Secondary malignant neoplasm of bone: Secondary | ICD-10-CM | POA: Diagnosis not present

## 2017-11-03 DIAGNOSIS — C61 Malignant neoplasm of prostate: Secondary | ICD-10-CM | POA: Diagnosis not present

## 2017-11-03 DIAGNOSIS — Z8551 Personal history of malignant neoplasm of bladder: Secondary | ICD-10-CM | POA: Diagnosis not present

## 2018-04-21 DIAGNOSIS — C61 Malignant neoplasm of prostate: Secondary | ICD-10-CM | POA: Diagnosis not present

## 2018-04-28 DIAGNOSIS — C61 Malignant neoplasm of prostate: Secondary | ICD-10-CM | POA: Diagnosis not present

## 2018-04-28 DIAGNOSIS — D090 Carcinoma in situ of bladder: Secondary | ICD-10-CM | POA: Diagnosis not present

## 2018-04-28 DIAGNOSIS — N5201 Erectile dysfunction due to arterial insufficiency: Secondary | ICD-10-CM | POA: Diagnosis not present

## 2018-04-28 DIAGNOSIS — C7951 Secondary malignant neoplasm of bone: Secondary | ICD-10-CM | POA: Diagnosis not present

## 2018-08-11 DIAGNOSIS — R310 Gross hematuria: Secondary | ICD-10-CM | POA: Diagnosis not present

## 2018-08-12 ENCOUNTER — Ambulatory Visit (INDEPENDENT_AMBULATORY_CARE_PROVIDER_SITE_OTHER): Payer: Medicare Other

## 2018-08-12 ENCOUNTER — Ambulatory Visit (INDEPENDENT_AMBULATORY_CARE_PROVIDER_SITE_OTHER): Payer: Medicare Other | Admitting: Orthopedic Surgery

## 2018-08-12 ENCOUNTER — Encounter (INDEPENDENT_AMBULATORY_CARE_PROVIDER_SITE_OTHER): Payer: Self-pay | Admitting: Orthopedic Surgery

## 2018-08-12 VITALS — BP 111/64 | HR 71 | Resp 18 | Ht 72.0 in | Wt 160.0 lb

## 2018-08-12 DIAGNOSIS — M4312 Spondylolisthesis, cervical region: Secondary | ICD-10-CM

## 2018-08-12 DIAGNOSIS — M5030 Other cervical disc degeneration, unspecified cervical region: Secondary | ICD-10-CM

## 2018-08-12 DIAGNOSIS — M542 Cervicalgia: Secondary | ICD-10-CM | POA: Diagnosis not present

## 2018-08-12 NOTE — Progress Notes (Signed)
Office Visit Note   Patient: Daniel King           Date of Birth: 12-29-1945           MRN: [id number] Visit Date: 08/12/2018              Requested by: Henreitta Cea, MD No address on file PCP: Henreitta Cea, MD   Assessment & Plan: Visit Diagnoses:  1. Neck pain, musculoskeletal   2. Other cervical disc degeneration, unspecified cervical region   3. Cervicalgia   4. Spondylolisthesis of cervical region     Plan:  #1: MRI scan of the cervical spine. #2: Follow back up after MRI  Follow-Up Instructions: Return for review of mri.   Orders:  Orders Placed This Encounter  Procedures  . XR Cervical Spine 2 or 3 views  . MR Cervical Spine w/o contrast   No orders of the defined types were placed in this encounter.     Procedures: No procedures performed   Clinical Data: No additional findings.   Subjective: Chief Complaint  Patient presents with  . Right Shoulder - Pain  . Left Shoulder - Pain  . Neck - Pain  . Shoulder Pain    Fell in driveway 2/94/76, stiffness, tenderness, no shoulder surgery  . Neck Pain    Tylenol helps some, tightness, not diabetic, no neck surgery    HPI  Shylo is a very pleasant 72 year old white male who is seen today for evaluation of his bilateral parascapular pain as well as posterior cervical spine pain.  This was a result of a fall in the driveway where he was in a squatting position and fell backwards on July 23, 2018.  At that time he developed stiffness and tenderness and pain in the more supra scapular area with some tightness.  He is also complaining of some cervical spine pain.  He denies any numbness or tingling.  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  He was just concerned of the fact that he still had this pain that he was hopeful that evaluation could be performed.  Review of Systems  Constitutional: Positive for fatigue.  HENT: Negative for trouble swallowing.   Eyes:  Negative for pain.  Respiratory: Positive for cough and shortness of breath.   Cardiovascular: Negative for leg swelling.  Gastrointestinal: Negative for constipation.  Endocrine: Negative for cold intolerance.  Genitourinary: Negative for difficulty urinating.  Musculoskeletal: Positive for gait problem, neck pain and neck stiffness.  Skin: Negative for rash.  Allergic/Immunologic: Negative for food allergies.  Neurological: Negative for weakness and numbness.  Hematological: Does not bruise/bleed easily.  Psychiatric/Behavioral: Negative for sleep disturbance.     Objective: Vital Signs: BP 111/64 (BP Location: Right Arm, Patient Position: Sitting, Cuff Size: Normal)   Pulse 71   Resp 18   Ht 6' (1.829 m)   Wt 160 lb (72.6 kg)   BMI 21.70 kg/m   Physical Exam  Constitutional: He is oriented to person, place, and time. He appears well-developed and well-nourished.  HENT:  Mouth/Throat: Oropharynx is clear and moist.  Eyes: Pupils are equal, round, and reactive to light. EOM are normal.  Pulmonary/Chest: Effort normal.  Neurological: He is alert and oriented to person, place, and time.  Skin: Skin is warm and dry.  Psychiatric: He has a normal mood and affect. His behavior is normal.    Ortho Exam  Today he has a limited range of motion of  the cervical spine to about 25 to 30 degrees backward extension forward flexion he is about 2 and half inches to 3 inches shy of chin to chest.  Right and left rotation only to about 25 to 30 degrees.  He does have bilateral supraspinatus atrophy noted.  He has good strength in the upper extremities with abduction.  This is equal bilaterally.  The left arm with resistance of external rotation is weak.  Right is normal.  Internal rotation he has good strength also.  Deep tendon reflexes were trace at the biceps bilateral and equal.  Absent at the triceps bilaterally.  Brachioradialis was trace bilaterally.     Specialty Comments:  No  specialty comments available.  Imaging: Xr Cervical Spine 2 Or 3 Views  Result Date: 08/12/2018 2 view x-ray of the cervical spine reveals multilevel grade 1 anterolisthesis of the cervical spine diffusely.  Most pronounced at C3-4 some at C4-5 and C5-6.  Multilevel disc space narrowing    PMFS History: Current Outpatient Medications  Medication Sig Dispense Refill  . albuterol (PROVENTIL HFA;VENTOLIN HFA) 108 (90 BASE) MCG/ACT inhaler Inhale 2 puffs into the lungs every 6 (six) hours as needed. For shortness of breath.    Marland Kitchen alendronate (FOSAMAX) 10 MG tablet Take by mouth.    Marland Kitchen atorvastatin (LIPITOR) 10 MG tablet Take 10 mg by mouth.    . cholecalciferol (VITAMIN D) 1000 UNITS tablet Take 1,000 Units by mouth daily.    Marland Kitchen docusate sodium (COLACE) 100 MG capsule Take by mouth.    . enzalutamide (XTANDI) 40 MG capsule Take by mouth.    Marland Kitchen ibuprofen (ADVIL,MOTRIN) 200 MG tablet Take 400 mg by mouth every 8 (eight) hours as needed. For pain.    . metoprolol tartrate (LOPRESSOR) 25 MG tablet Take 12.5 mg by mouth.    . Multiple Vitamin (MULTIVITAMIN WITH MINERALS) TABS Take 1 tablet by mouth daily.    Marland Kitchen tiotropium (SPIRIVA) 18 MCG inhalation capsule Place 18 mcg into inhaler and inhale daily.    Marland Kitchen aspirin EC 81 MG tablet Take 81 mg by mouth.    Marland Kitchen lisinopril (PRINIVIL,ZESTRIL) 5 MG tablet Take 5 mg by mouth daily.    Marland Kitchen oxyCODONE-acetaminophen (PERCOCET/ROXICET) 5-325 MG per tablet Take 1-2 tablets by mouth every 6 (six) hours as needed for pain. (Patient not taking: Reported on 08/12/2018) 20 tablet 0  . simvastatin (ZOCOR) 10 MG tablet Take 10 mg by mouth at bedtime.     No current facility-administered medications for this visit.     There are no active problems to display for this patient.  Past Medical History:  Diagnosis Date  . AAA (abdominal aortic aneurysm) ([place])   . Bladder cancer [facility])    prostate  . COPD (chronic obstructive pulmonary disease) ([place])   . Hypertension       History reviewed. No pertinent family history.  Past Surgical History:  Procedure Laterality Date  . ABDOMINAL AORTIC ANEURYSM REPAIR    . PROSTATECTOMY     Social History   Occupational History  . Not on file  Tobacco Use  . Smoking status: Current Every Day Smoker    Packs/day: 1.50    Years: 52.00    Pack years: 78.00    Types: Cigarettes  . Smokeless tobacco: Never Used  Substance and Sexual Activity  . Alcohol use: Yes    Alcohol/week: 21.0 standard drinks    Types: 21 Shots of liquor per week  . Drug use: No  . Sexual  activity: Not on file

## 2018-08-13 ENCOUNTER — Ambulatory Visit
Admission: RE | Admit: 2018-08-13 | Discharge: 2018-08-13 | Disposition: A | Discharge status: Home / Self Care | Payer: Medicare Other | Source: Ambulatory Visit | Attending: Orthopedic Surgery | Admitting: Orthopedic Surgery

## 2018-08-13 ENCOUNTER — Telehealth (INDEPENDENT_AMBULATORY_CARE_PROVIDER_SITE_OTHER): Payer: Self-pay | Admitting: Orthopaedic Surgery

## 2018-08-13 DIAGNOSIS — M542 Cervicalgia: Secondary | ICD-10-CM

## 2018-08-13 DIAGNOSIS — M4802 Spinal stenosis, cervical region: Secondary | ICD-10-CM | POA: Diagnosis not present

## 2018-08-13 DIAGNOSIS — M5030 Other cervical disc degeneration, unspecified cervical region: Secondary | ICD-10-CM

## 2018-08-13 NOTE — Telephone Encounter (Signed)
I called patient, per Aaron Edelman, patient should try cervical collar, Aspercream, Icy Hot, Tylenol. If patient decides to try cervical collar, he will call office.

## 2018-08-13 NOTE — Telephone Encounter (Signed)
Patient called checking to see if [place], [facility], or any over the counter pain relief patches or sprays would help his neck pain or if it would "just be a waste of money."

## 2018-08-20 ENCOUNTER — Ambulatory Visit (INDEPENDENT_AMBULATORY_CARE_PROVIDER_SITE_OTHER): Payer: Medicare Other | Admitting: Orthopaedic Surgery

## 2018-08-20 ENCOUNTER — Encounter (INDEPENDENT_AMBULATORY_CARE_PROVIDER_SITE_OTHER): Payer: Self-pay | Admitting: Orthopaedic Surgery

## 2018-08-20 VITALS — BP 138/84 | HR 77 | Resp 18 | Ht 72.0 in | Wt 163.0 lb

## 2018-08-20 DIAGNOSIS — M542 Cervicalgia: Secondary | ICD-10-CM

## 2018-08-20 NOTE — Progress Notes (Signed)
Office Visit Note   Patient: Daniel King           Date of Birth: Dec 02, 1945           MRN: [id number] Visit Date: 08/20/2018              Requested by: Henreitta Cea, MD No address on file PCP: Henreitta Cea, MD   Assessment & Plan: Visit Diagnoses:  1. Cervicalgia     Plan: MRI scan of cervical spine reveals facet arthropathy with anterior listhesis from C2-3 to C5-6 there is spinal stenosis with mild cord flattening at C3-4.  There is also foraminal impingement bilaterally at C3-4 on the left at C4-5 and on the right at C5-6 and on the right at C6-7.  Mr. Jezewski is that he is doing very well at this point.  He is not having any radicular pain.  Not having much neck pain.  I have offered him physical therapy but he knows he is been happy with hot packs and massage by his wife.  He is relatively asymptomatic at this point without any acute changes.  He like to continue with what he is doing and I will plan to see him back as needed.  Follow-Up Instructions: Return if symptoms worsen or fail to improve.   Orders:  No orders of the defined types were placed in this encounter.  No orders of the defined types were placed in this encounter.     Procedures: No procedures performed   Clinical Data: No additional findings.   Subjective: Chief Complaint  Patient presents with  . Neck - Follow-up  . Neck Pain    C-spine MRI results  1 month ago sustained an injury to his cervical spine.  Aaron Edelman saw him in the office and ordered an MRI scan.  Results are as above.  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  Mild stenosis at 1-2 levels.  Presently he is asymptomatic.  He is happy with his range of motion and the fact that he is not having much pain.  He is not having any radicular symptoms to either upper or lower extremity.  Not having any headaches.  HPI  Review of Systems  Constitutional: Negative for fatigue.  HENT: Negative for  trouble swallowing.   Eyes: Negative for pain.  Respiratory: Negative for shortness of breath.   Cardiovascular: Negative for leg swelling.  Gastrointestinal: Negative for constipation.  Endocrine: Negative for cold intolerance.  Genitourinary: Negative for difficulty urinating.  Musculoskeletal: Positive for neck pain and neck stiffness.  Skin: Negative for rash.  Allergic/Immunologic: Negative for food allergies.  Neurological: Positive for weakness.  Hematological: Does not bruise/bleed easily.  Psychiatric/Behavioral: Negative for sleep disturbance.     Objective: Vital Signs: BP 138/84 (BP Location: Right Arm, Patient Position: Sitting, Cuff Size: Normal)   Pulse 77   Resp 18   Ht 6' (1.829 m)   Wt 163 lb (73.9 kg)   BMI 22.11 kg/m   Physical Exam  Constitutional: He is oriented to person, place, and time. He appears well-developed and well-nourished.  HENT:  Mouth/Throat: Oropharynx is clear and moist.  Eyes: Pupils are equal, round, and reactive to light. EOM are normal.  Pulmonary/Chest: Effort normal.  Neurological: He is alert and oriented to person, place, and time.  Skin: Skin is warm and dry.  Psychiatric: He has a normal mood and affect. His behavior is normal.    Ortho Exam awake alert and  oriented x3.  Comfortable sitting.  Able to quickly touch his chin to his chest without any pain.  Has about 60% of normal neck extension without any referred pain to either shoulder or  either upper extremity.  Rotation to the right to the left is about 70%.  No pain with any motion.  Good grip and good release  Specialty Comments:  No specialty comments available.  Imaging: No results found.   PMFS History: There are no active problems to display for this patient.  Past Medical History:  Diagnosis Date  . AAA (abdominal aortic aneurysm) ([place])   . Bladder cancer [facility])    prostate  . COPD (chronic obstructive pulmonary disease) ([place])   . Hypertension       History reviewed. No pertinent family history.  Past Surgical History:  Procedure Laterality Date  . ABDOMINAL AORTIC ANEURYSM REPAIR    . PROSTATECTOMY     Social History   Occupational History  . Not on file  Tobacco Use  . Smoking status: Current Every Day Smoker    Packs/day: 1.50    Years: 52.00    Pack years: 78.00    Types: Cigarettes  . Smokeless tobacco: Never Used  Substance and Sexual Activity  . Alcohol use: Yes    Alcohol/week: 21.0 standard drinks    Types: 21 Shots of liquor per week  . Drug use: No  . Sexual activity: Not on file

## 2018-10-13 DIAGNOSIS — C61 Malignant neoplasm of prostate: Secondary | ICD-10-CM | POA: Diagnosis not present

## 2018-10-19 DIAGNOSIS — Z5111 Encounter for antineoplastic chemotherapy: Secondary | ICD-10-CM | POA: Diagnosis not present

## 2018-10-19 DIAGNOSIS — C7951 Secondary malignant neoplasm of bone: Secondary | ICD-10-CM | POA: Diagnosis not present

## 2018-10-19 DIAGNOSIS — C61 Malignant neoplasm of prostate: Secondary | ICD-10-CM | POA: Diagnosis not present

## 2018-10-19 DIAGNOSIS — Z8551 Personal history of malignant neoplasm of bladder: Secondary | ICD-10-CM | POA: Diagnosis not present

## 2018-12-24 IMAGING — MR MR CERVICAL SPINE W/O CM
4 of 5 series · 25 of 48 positions shown · non-contrast
Comparison: Radiography 08/12/2018

CLINICAL DATA: Other cervical disc degeneration, unspecified
cervical region. Cervicalgia.

EXAM:
MRI CERVICAL SPINE WITHOUT CONTRAST
TECHNIQUE: Multiplanar, multisequence MR imaging of the cervical spine was
performed. No intravenous contrast was administered.

[Series 4: T2 post-contrast · sagittal · 3.3mm · 0.37mm/px · 6 of 13 slices shown]
[im 1/13]
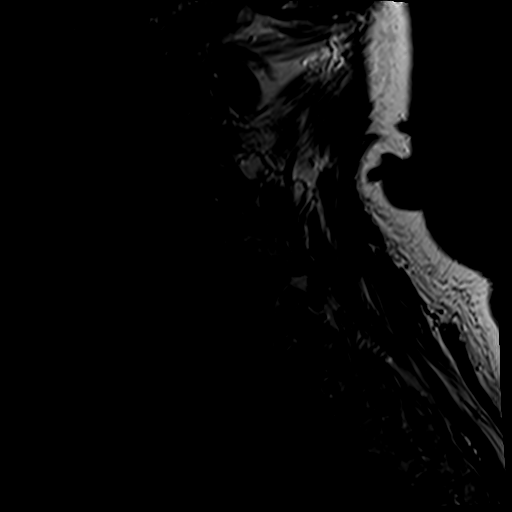
[im 3/13]
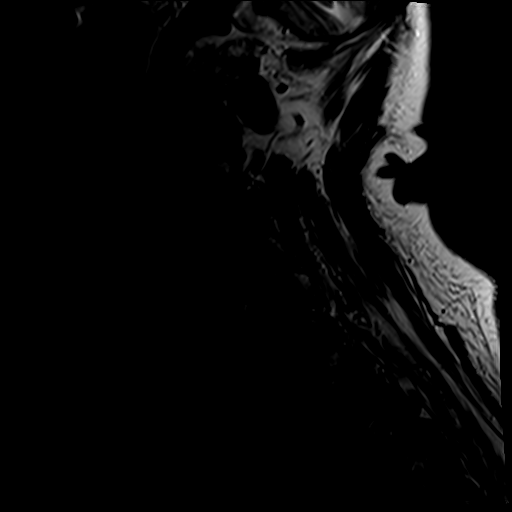
[im 5/13]
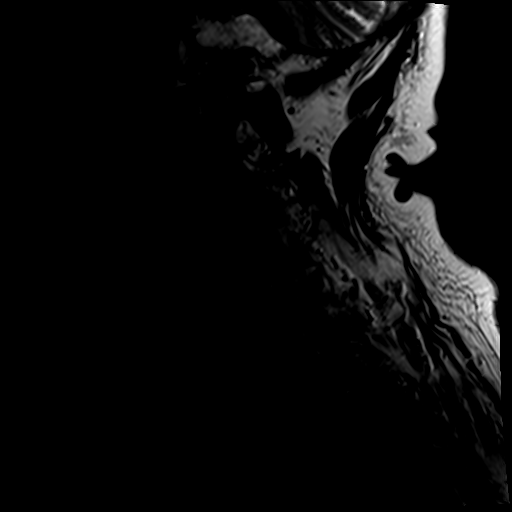
[im 8/13]
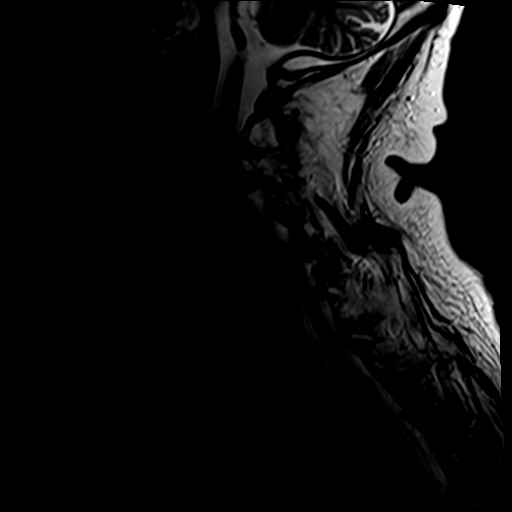
[im 10/13]
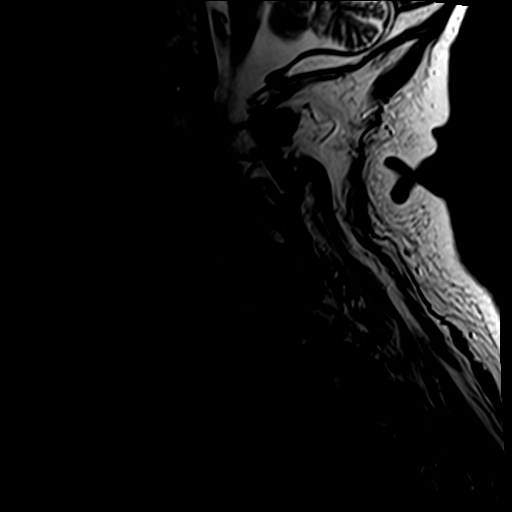
[im 13/13]
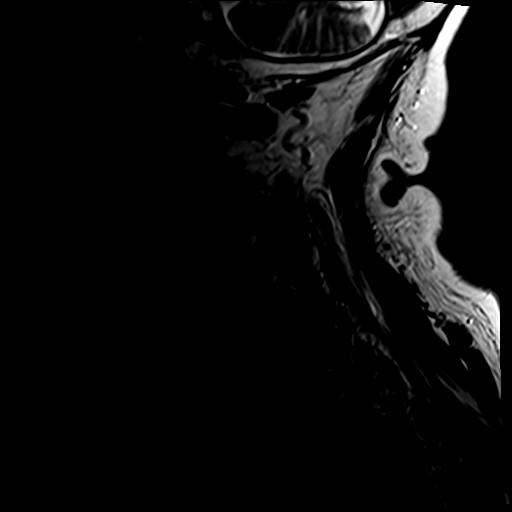

[Series 5: T1 · sagittal · 3.3mm · 0.37mm/px · 6 of 13 slices shown]
[im 1/13]
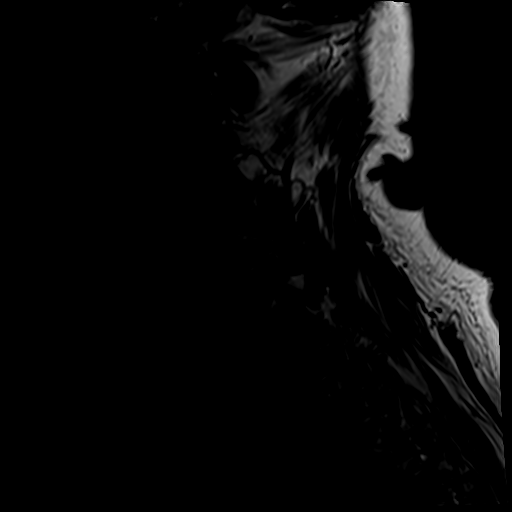
[im 3/13]
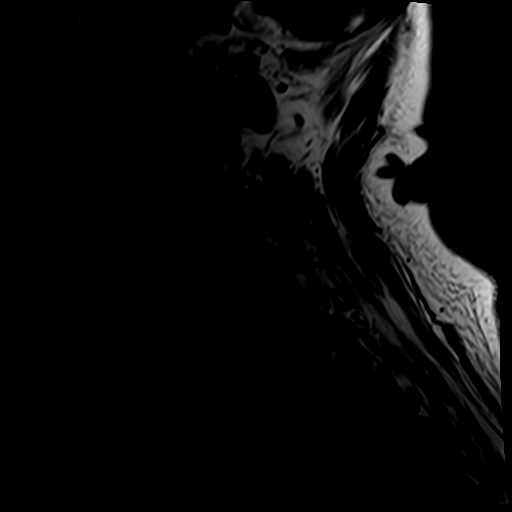
[im 5/13]
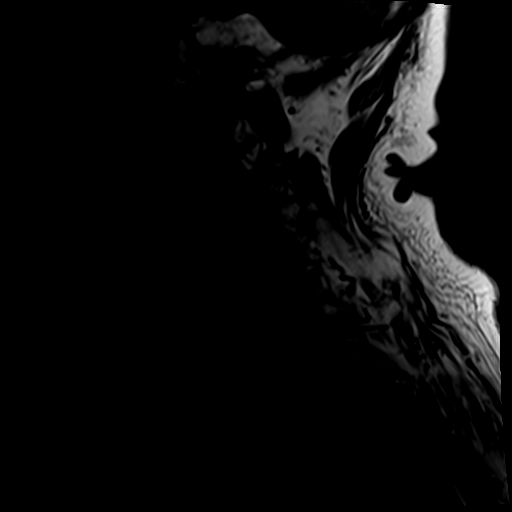
[im 8/13]
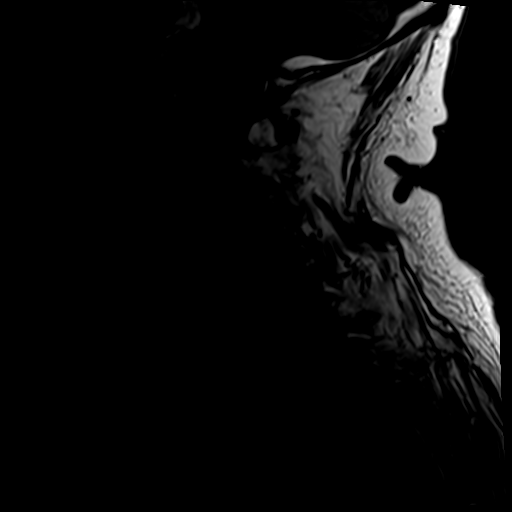
[im 10/13]
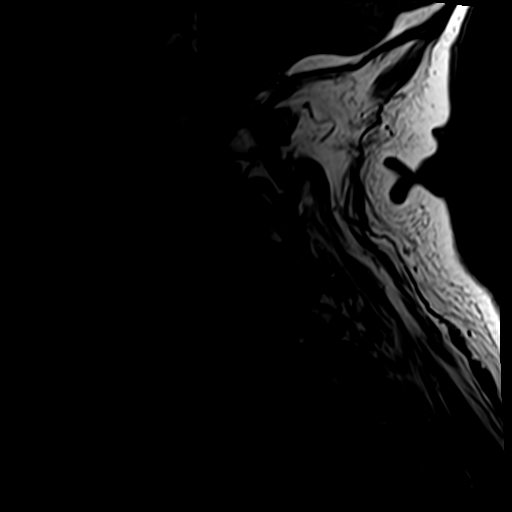
[im 13/13]
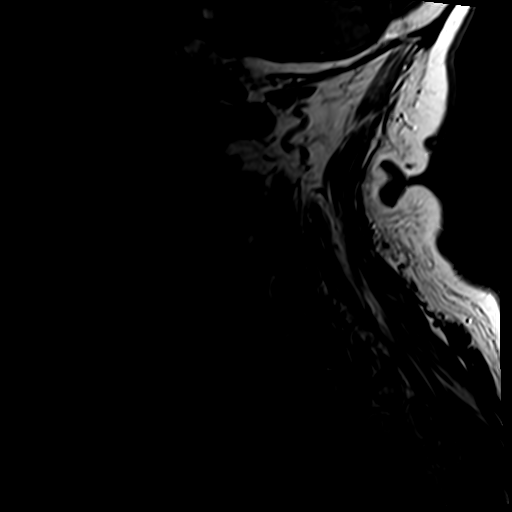

[Series 6: tir sag · sagittal · 3.3mm · 0.37mm/px · 4 of 13 slices shown]
[im 1/13]
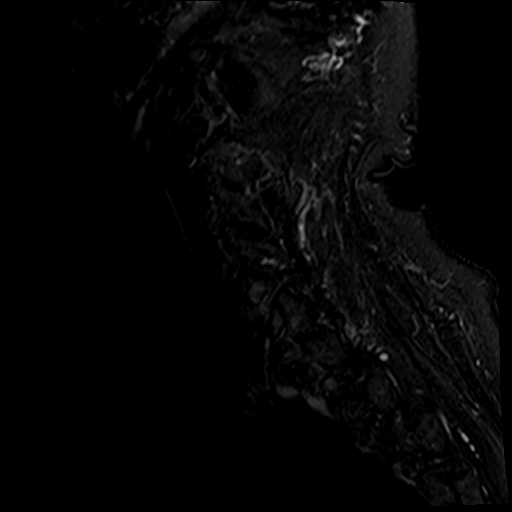
[im 3/13]
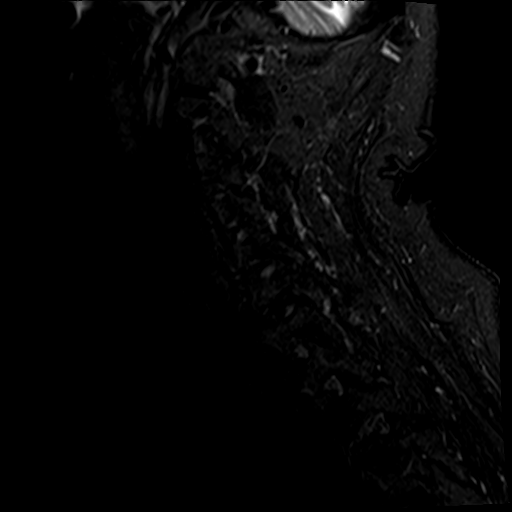
[im 8/13]
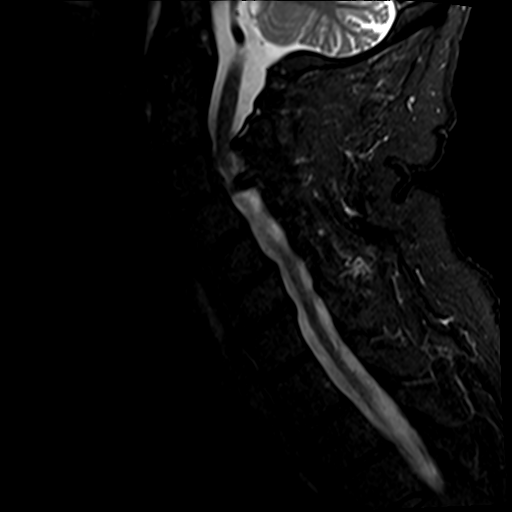
[im 13/13]
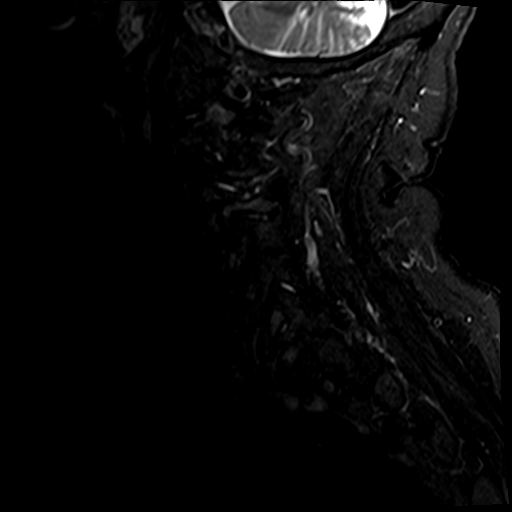

[Series 8: T2 · axial · 3.0mm · 0.70mm/px · z∈[-63,+36]mm · 9 of 30 slices shown]
[im 1/30]
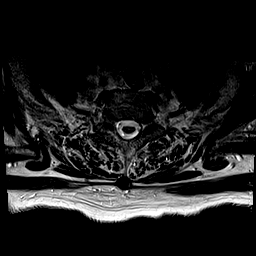
[im 5/30]
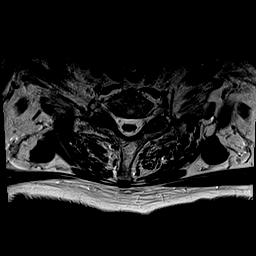
[im 9/30]
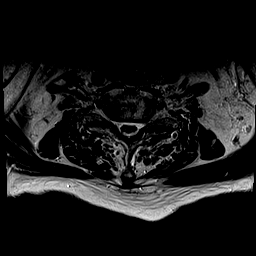
[im 13/30]
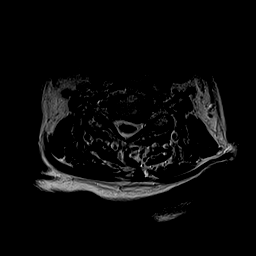
[im 15/30]
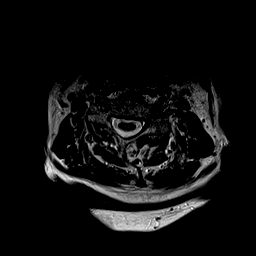
[im 17/30]
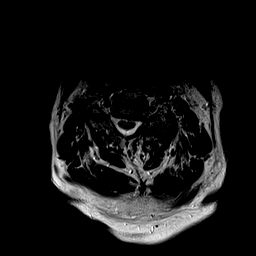
[im 21/30]
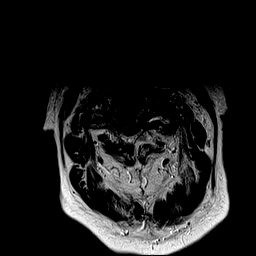
[im 25/30]
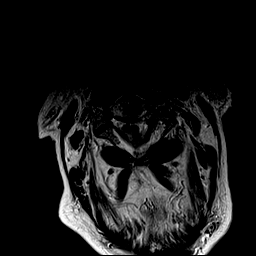
[im 30/30]
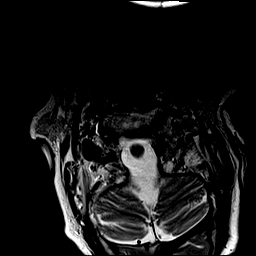

[25 of 48 positions shown; findings below may reference images not displayed]

FINDINGS: Alignment: Anterolisthesis at C2-3 to C5-6, facet mediated, greatest
at C3-4 where slip measures 4 mm.

Vertebrae: No fracture, evidence of discitis, or bone lesion.
Heterogeneous marrow with patchy fatty infiltration; T1 is less
involved than the other levels but still no underlying lesion
suspected at this level. Remote T2 superior endplate fracture.

Cord: Normal signal and morphology.

Posterior Fossa, vertebral arteries, paraspinal tissues: Negative.

Disc levels:

C2-3: Asymmetric left facet spurring with ligamentum flavum
thickening and mild anterolisthesis. Shallow central protrusion.
Patent canal and foramina based on gradient imaging.

C3-4: Advanced facet arthropathy with spurring on the left. The left
facet joint is gaping with fluid. There is mild anterolisthesis.
Ligamentum flavum thickening. Bulging of the uncovered disc. Spinal
stenosis with cord flattening. Left more than right foraminal
impingement

C4-5: Facet spurring with bulky hypertrophy on the left. There is
left uncovertebral spurring. Moderate left foraminal narrowing.
Patent canal

C5-6: Facet spurring and mild anterolisthesis. Disc narrowing and
mild bulging. Mild left and advanced right foraminal impingement.

C6-7: Advanced disc narrowing with bulging and endplate ridging.
Negative facets. Moderate right and more mild to moderate left
foraminal narrowing. Patent canal

C7-T1:Unremarkable.
IMPRESSION: 1. Facet arthropathy with anterolisthesis from C2-3 to C5-6,
greatest at C3-4 where there is 4 mm of listhesis.
2. Spinal stenosis with mild cord flattening at C3-4.
3. Foraminal impingement bilaterally at C3-4, on the left at C4-5,
on the right at C5-6, and on the right more than left at C6-7.

## 2019-04-19 DIAGNOSIS — C61 Malignant neoplasm of prostate: Secondary | ICD-10-CM | POA: Diagnosis not present

## 2019-04-26 DIAGNOSIS — C7951 Secondary malignant neoplasm of bone: Secondary | ICD-10-CM | POA: Diagnosis not present

## 2019-04-26 DIAGNOSIS — C61 Malignant neoplasm of prostate: Secondary | ICD-10-CM | POA: Diagnosis not present

## 2019-04-26 DIAGNOSIS — Z8551 Personal history of malignant neoplasm of bladder: Secondary | ICD-10-CM | POA: Diagnosis not present

## 2019-07-29 ENCOUNTER — Other Ambulatory Visit: Payer: Self-pay | Admitting: Urology

## 2019-07-29 ENCOUNTER — Other Ambulatory Visit (HOSPITAL_COMMUNITY): Payer: Self-pay | Admitting: Urology

## 2019-07-29 DIAGNOSIS — C61 Malignant neoplasm of prostate: Secondary | ICD-10-CM

## 2019-07-29 DIAGNOSIS — N5201 Erectile dysfunction due to arterial insufficiency: Secondary | ICD-10-CM | POA: Diagnosis not present

## 2019-07-29 DIAGNOSIS — D090 Carcinoma in situ of bladder: Secondary | ICD-10-CM | POA: Diagnosis not present

## 2019-07-29 DIAGNOSIS — C7951 Secondary malignant neoplasm of bone: Secondary | ICD-10-CM | POA: Diagnosis not present

## 2019-07-29 DIAGNOSIS — N200 Calculus of kidney: Secondary | ICD-10-CM | POA: Diagnosis not present

## 2019-07-29 DIAGNOSIS — M8580 Other specified disorders of bone density and structure, unspecified site: Secondary | ICD-10-CM

## 2019-08-17 ENCOUNTER — Other Ambulatory Visit: Payer: Self-pay

## 2019-08-17 ENCOUNTER — Ambulatory Visit: Payer: Self-pay

## 2019-08-17 ENCOUNTER — Ambulatory Visit (INDEPENDENT_AMBULATORY_CARE_PROVIDER_SITE_OTHER): Payer: Medicare Other | Admitting: Orthopaedic Surgery

## 2019-08-17 ENCOUNTER — Encounter: Payer: Self-pay | Admitting: Orthopaedic Surgery

## 2019-08-17 VITALS — Ht 72.0 in | Wt 156.0 lb

## 2019-08-17 DIAGNOSIS — M25562 Pain in left knee: Secondary | ICD-10-CM | POA: Insufficient documentation

## 2019-08-17 NOTE — Progress Notes (Signed)
Office Visit Note   Patient: Daniel King           Date of Birth: January 31, 1946           MRN: [id number] Visit Date: 08/17/2019              Requested by: Henreitta Cea, [facility] [place],  [place] [id number] PCP: Henreitta Cea, MD   Assessment & Plan: Visit Diagnoses:  1. Acute pain of left knee     Plan: Acute onset of pain after a twisting injury 2 days ago.  X-rays were negative.  Exam is relatively benign.  We will try a spider brace and monitor his response over the next several weeks.  This might allow him to ambulate with less discomfort.  There is no suggestion of that that his pain is referred from his hip or his back. Follow-Up Instructions: No follow-ups on file.   Orders:  Orders Placed This Encounter  Procedures  . XR KNEE 3 VIEW LEFT   No orders of the defined types were placed in this encounter.     Procedures: No procedures performed   Clinical Data: No additional findings.   Subjective: Chief Complaint  Patient presents with  . Left Knee - Pain  Patient presents today for left knee pain. Patient states that he twisted his knee Sunday evening. His pain is located medially. No swelling. He said that he cannot bear weight on his leg. He is getting around at home in a desk chair. He is taking Tylenol for pain.   HPI  Review of Systems   Objective: Vital Signs: Ht 6' (1.829 m)   Wt 156 lb (70.8 kg)   BMI 21.16 kg/m   Physical Exam Constitutional:      Appearance: He is well-developed.  Eyes:     Pupils: Pupils are equal, round, and reactive to light.  Pulmonary:     Effort: Pulmonary effort is normal.  Skin:    General: Skin is warm and dry.  Neurological:     Mental Status: He is alert and oriented to person, place, and time.  Psychiatric:        Behavior: Behavior normal.     Ortho Exam awake alert and oriented x3.  Appears much older than his stated age.  Left knee was not hot red warm or swollen.   No ecchymosis.  No effusion.  No significant medial or lateral joint pain or patella discomfort.  Full extension and flexion over 120 degrees without instability.  No popliteal pain.  No calf pain no distal edema.  No pain with range of motion of his hip.  Straight leg raise is negative.  No thigh pain. Specialty Comments:  No specialty comments available.  Imaging: No results found.   PMFS History: Patient Active Problem List   Diagnosis Date Noted  . Pain in left knee 08/17/2019   Past Medical History:  Diagnosis Date  . AAA (abdominal aortic aneurysm) ([place])   . Bladder cancer [facility])    prostate  . COPD (chronic obstructive pulmonary disease) ([place])   . Hypertension     History reviewed. No pertinent family history.  Past Surgical History:  Procedure Laterality Date  . ABDOMINAL AORTIC ANEURYSM REPAIR    . PROSTATECTOMY     Social History   Occupational History  . Not on file  Tobacco Use  . Smoking status: Current Every Day Smoker    Packs/day: 1.50    Years: 52.00  Pack years: 78.00    Types: Cigarettes  . Smokeless tobacco: Never Used  Substance and Sexual Activity  . Alcohol use: Yes    Alcohol/week: 21.0 standard drinks    Types: 21 Shots of liquor per week  . Drug use: No  . Sexual activity: Not on file

## 2019-08-24 ENCOUNTER — Ambulatory Visit: Payer: Medicare Other | Admitting: Orthopaedic Surgery

## 2019-10-13 DIAGNOSIS — C61 Malignant neoplasm of prostate: Secondary | ICD-10-CM | POA: Diagnosis not present

## 2019-10-15 ENCOUNTER — Other Ambulatory Visit: Payer: Self-pay | Admitting: Urology

## 2019-10-15 DIAGNOSIS — C61 Malignant neoplasm of prostate: Secondary | ICD-10-CM

## 2019-10-15 DIAGNOSIS — M8580 Other specified disorders of bone density and structure, unspecified site: Secondary | ICD-10-CM

## 2019-10-18 ENCOUNTER — Encounter (HOSPITAL_COMMUNITY)
Admission: RE | Admit: 2019-10-18 | Discharge: 2019-10-18 | Disposition: A | Discharge status: Home / Self Care | Payer: Medicare Other | Source: Ambulatory Visit | Attending: Urology | Admitting: Urology

## 2019-10-18 ENCOUNTER — Other Ambulatory Visit: Payer: Self-pay

## 2019-10-18 DIAGNOSIS — C61 Malignant neoplasm of prostate: Secondary | ICD-10-CM | POA: Insufficient documentation

## 2019-10-18 MED ORDER — TECHNETIUM TC 99M MEDRONATE IV KIT: 20.0000 | PACK | Freq: Once | INTRAVENOUS | Status: DC | PRN

## 2019-10-18 MED ORDER — TECHNETIUM TC 99M MEDRONATE IV KIT
20.3000 | PACK | Freq: Once | INTRAVENOUS | Status: AC | PRN
  Administered 2019-10-18: 20.3 via INTRAVENOUS

## 2019-10-19 ENCOUNTER — Ambulatory Visit
Admission: RE | Admit: 2019-10-19 | Discharge: 2019-10-19 | Disposition: A | Discharge status: Home / Self Care | Payer: Medicare Other | Source: Ambulatory Visit | Attending: Urology | Admitting: Urology

## 2019-10-19 DIAGNOSIS — M8580 Other specified disorders of bone density and structure, unspecified site: Secondary | ICD-10-CM

## 2019-10-19 DIAGNOSIS — C61 Malignant neoplasm of prostate: Secondary | ICD-10-CM

## 2019-10-27 ENCOUNTER — Other Ambulatory Visit: Payer: Self-pay | Admitting: Urology

## 2019-10-27 DIAGNOSIS — C61 Malignant neoplasm of prostate: Secondary | ICD-10-CM

## 2019-10-27 DIAGNOSIS — C7951 Secondary malignant neoplasm of bone: Secondary | ICD-10-CM

## 2019-11-08 ENCOUNTER — Other Ambulatory Visit: Payer: Self-pay

## 2019-11-08 ENCOUNTER — Ambulatory Visit
Admission: RE | Admit: 2019-11-08 | Discharge: 2019-11-08 | Disposition: A | Discharge status: Home / Self Care | Payer: Medicare Other | Source: Ambulatory Visit | Attending: Urology | Admitting: Urology

## 2020-01-13 DIAGNOSIS — I6529 Occlusion and stenosis of unspecified carotid artery: Secondary | ICD-10-CM | POA: Diagnosis not present

## 2020-01-13 DIAGNOSIS — Z8679 Personal history of other diseases of the circulatory system: Secondary | ICD-10-CM | POA: Diagnosis not present

## 2020-01-13 DIAGNOSIS — I779 Disorder of arteries and arterioles, unspecified: Secondary | ICD-10-CM | POA: Diagnosis not present

## 2020-01-13 DIAGNOSIS — N281 Cyst of kidney, acquired: Secondary | ICD-10-CM | POA: Diagnosis not present

## 2020-01-13 DIAGNOSIS — Z8546 Personal history of malignant neoplasm of prostate: Secondary | ICD-10-CM | POA: Diagnosis not present

## 2020-01-13 DIAGNOSIS — I714 Abdominal aortic aneurysm, without rupture: Secondary | ICD-10-CM | POA: Diagnosis not present

## 2020-01-13 DIAGNOSIS — Z48812 Encounter for surgical aftercare following surgery on the circulatory system: Secondary | ICD-10-CM | POA: Diagnosis not present

## 2020-01-13 DIAGNOSIS — Z9889 Other specified postprocedural states: Secondary | ICD-10-CM | POA: Diagnosis not present

## 2020-01-13 DIAGNOSIS — N200 Calculus of kidney: Secondary | ICD-10-CM | POA: Diagnosis not present

## 2020-01-20 DIAGNOSIS — C61 Malignant neoplasm of prostate: Secondary | ICD-10-CM | POA: Diagnosis not present

## 2020-01-21 DIAGNOSIS — R0989 Other specified symptoms and signs involving the circulatory and respiratory systems: Secondary | ICD-10-CM | POA: Diagnosis not present

## 2020-01-21 DIAGNOSIS — I6523 Occlusion and stenosis of bilateral carotid arteries: Secondary | ICD-10-CM | POA: Diagnosis not present

## 2020-01-27 DIAGNOSIS — M818 Other osteoporosis without current pathological fracture: Secondary | ICD-10-CM | POA: Diagnosis not present

## 2020-01-27 DIAGNOSIS — D090 Carcinoma in situ of bladder: Secondary | ICD-10-CM | POA: Diagnosis not present

## 2020-01-27 DIAGNOSIS — C61 Malignant neoplasm of prostate: Secondary | ICD-10-CM | POA: Diagnosis not present

## 2020-01-27 DIAGNOSIS — N281 Cyst of kidney, acquired: Secondary | ICD-10-CM | POA: Diagnosis not present

## 2020-01-27 DIAGNOSIS — C7951 Secondary malignant neoplasm of bone: Secondary | ICD-10-CM | POA: Diagnosis not present

## 2020-02-18 DIAGNOSIS — I779 Disorder of arteries and arterioles, unspecified: Secondary | ICD-10-CM | POA: Diagnosis not present

## 2020-02-18 DIAGNOSIS — Z01812 Encounter for preprocedural laboratory examination: Secondary | ICD-10-CM | POA: Diagnosis not present

## 2020-02-19 DIAGNOSIS — Z20822 Contact with and (suspected) exposure to covid-19: Secondary | ICD-10-CM | POA: Diagnosis not present

## 2020-02-19 DIAGNOSIS — Z01812 Encounter for preprocedural laboratory examination: Secondary | ICD-10-CM | POA: Diagnosis not present

## 2020-02-19 DIAGNOSIS — I779 Disorder of arteries and arterioles, unspecified: Secondary | ICD-10-CM | POA: Diagnosis not present

## 2020-02-25 DIAGNOSIS — I779 Disorder of arteries and arterioles, unspecified: Secondary | ICD-10-CM | POA: Diagnosis not present

## 2020-02-25 DIAGNOSIS — Z7902 Long term (current) use of antithrombotics/antiplatelets: Secondary | ICD-10-CM | POA: Diagnosis not present

## 2020-02-25 DIAGNOSIS — I10 Essential (primary) hypertension: Secondary | ICD-10-CM | POA: Diagnosis not present

## 2020-02-25 DIAGNOSIS — F17210 Nicotine dependence, cigarettes, uncomplicated: Secondary | ICD-10-CM | POA: Diagnosis not present

## 2020-02-25 DIAGNOSIS — T82856A Stenosis of peripheral vascular stent, initial encounter: Secondary | ICD-10-CM | POA: Diagnosis not present

## 2020-02-25 DIAGNOSIS — I714 Abdominal aortic aneurysm, without rupture: Secondary | ICD-10-CM | POA: Diagnosis not present

## 2020-02-25 DIAGNOSIS — E785 Hyperlipidemia, unspecified: Secondary | ICD-10-CM | POA: Diagnosis not present

## 2020-02-25 DIAGNOSIS — T82858A Stenosis of vascular prosthetic devices, implants and grafts, initial encounter: Secondary | ICD-10-CM | POA: Diagnosis not present

## 2020-04-06 DIAGNOSIS — I779 Disorder of arteries and arterioles, unspecified: Secondary | ICD-10-CM | POA: Diagnosis not present

## 2020-04-06 DIAGNOSIS — Z95820 Peripheral vascular angioplasty status with implants and grafts: Secondary | ICD-10-CM | POA: Diagnosis not present

## 2020-04-27 DIAGNOSIS — E559 Vitamin D deficiency, unspecified: Secondary | ICD-10-CM | POA: Diagnosis not present

## 2020-04-27 DIAGNOSIS — C61 Malignant neoplasm of prostate: Secondary | ICD-10-CM | POA: Diagnosis not present

## 2020-05-18 DIAGNOSIS — Z5111 Encounter for antineoplastic chemotherapy: Secondary | ICD-10-CM | POA: Diagnosis not present

## 2020-05-18 DIAGNOSIS — C7951 Secondary malignant neoplasm of bone: Secondary | ICD-10-CM | POA: Diagnosis not present

## 2020-05-18 DIAGNOSIS — C61 Malignant neoplasm of prostate: Secondary | ICD-10-CM | POA: Diagnosis not present

## 2020-05-18 DIAGNOSIS — D090 Carcinoma in situ of bladder: Secondary | ICD-10-CM | POA: Diagnosis not present

## 2020-05-29 DIAGNOSIS — D090 Carcinoma in situ of bladder: Secondary | ICD-10-CM | POA: Diagnosis not present

## 2020-05-29 DIAGNOSIS — M6289 Other specified disorders of muscle: Secondary | ICD-10-CM | POA: Diagnosis not present

## 2020-05-29 DIAGNOSIS — M818 Other osteoporosis without current pathological fracture: Secondary | ICD-10-CM | POA: Diagnosis not present

## 2020-05-29 DIAGNOSIS — M6281 Muscle weakness (generalized): Secondary | ICD-10-CM | POA: Diagnosis not present

## 2020-05-29 DIAGNOSIS — M62838 Other muscle spasm: Secondary | ICD-10-CM | POA: Diagnosis not present

## 2020-05-29 DIAGNOSIS — R262 Difficulty in walking, not elsewhere classified: Secondary | ICD-10-CM | POA: Diagnosis not present

## 2020-07-13 DIAGNOSIS — Z95820 Peripheral vascular angioplasty status with implants and grafts: Secondary | ICD-10-CM | POA: Diagnosis not present

## 2020-07-13 DIAGNOSIS — I714 Abdominal aortic aneurysm, without rupture: Secondary | ICD-10-CM | POA: Diagnosis not present

## 2020-07-13 DIAGNOSIS — Z9889 Other specified postprocedural states: Secondary | ICD-10-CM | POA: Diagnosis not present

## 2020-07-13 DIAGNOSIS — Z7902 Long term (current) use of antithrombotics/antiplatelets: Secondary | ICD-10-CM | POA: Diagnosis not present

## 2020-07-13 DIAGNOSIS — I723 Aneurysm of iliac artery: Secondary | ICD-10-CM | POA: Diagnosis not present

## 2020-07-13 DIAGNOSIS — F17200 Nicotine dependence, unspecified, uncomplicated: Secondary | ICD-10-CM | POA: Diagnosis not present

## 2020-07-13 DIAGNOSIS — Z95828 Presence of other vascular implants and grafts: Secondary | ICD-10-CM | POA: Diagnosis not present

## 2020-08-05 DIAGNOSIS — I10 Essential (primary) hypertension: Secondary | ICD-10-CM | POA: Diagnosis not present

## 2020-08-05 DIAGNOSIS — R0902 Hypoxemia: Secondary | ICD-10-CM | POA: Diagnosis not present

## 2020-08-06 DIAGNOSIS — I10 Essential (primary) hypertension: Secondary | ICD-10-CM | POA: Diagnosis not present

## 2020-08-06 DIAGNOSIS — I639 Cerebral infarction, unspecified: Secondary | ICD-10-CM | POA: Diagnosis not present

## 2020-08-06 DIAGNOSIS — Z720 Tobacco use: Secondary | ICD-10-CM | POA: Diagnosis not present

## 2020-08-09 DIAGNOSIS — I272 Pulmonary hypertension, unspecified: Secondary | ICD-10-CM | POA: Diagnosis present

## 2020-08-09 DIAGNOSIS — R278 Other lack of coordination: Secondary | ICD-10-CM | POA: Diagnosis present

## 2020-08-09 DIAGNOSIS — I10 Essential (primary) hypertension: Secondary | ICD-10-CM | POA: Diagnosis present

## 2020-08-09 DIAGNOSIS — I6389 Other cerebral infarction: Secondary | ICD-10-CM | POA: Diagnosis not present

## 2020-08-09 DIAGNOSIS — E785 Hyperlipidemia, unspecified: Secondary | ICD-10-CM | POA: Diagnosis present

## 2020-08-09 DIAGNOSIS — Z743 Need for continuous supervision: Secondary | ICD-10-CM | POA: Diagnosis not present

## 2020-08-09 DIAGNOSIS — R29898 Other symptoms and signs involving the musculoskeletal system: Secondary | ICD-10-CM | POA: Diagnosis not present

## 2020-08-09 DIAGNOSIS — J449 Chronic obstructive pulmonary disease, unspecified: Secondary | ICD-10-CM | POA: Diagnosis present

## 2020-08-09 DIAGNOSIS — Z741 Need for assistance with personal care: Secondary | ICD-10-CM | POA: Diagnosis present

## 2020-08-09 DIAGNOSIS — C61 Malignant neoplasm of prostate: Secondary | ICD-10-CM | POA: Diagnosis present

## 2020-08-09 DIAGNOSIS — Z7409 Other reduced mobility: Secondary | ICD-10-CM | POA: Diagnosis present

## 2020-08-09 DIAGNOSIS — R2689 Other abnormalities of gait and mobility: Secondary | ICD-10-CM | POA: Diagnosis present

## 2020-08-09 DIAGNOSIS — F17210 Nicotine dependence, cigarettes, uncomplicated: Secondary | ICD-10-CM | POA: Diagnosis present

## 2020-08-09 DIAGNOSIS — I69398 Other sequelae of cerebral infarction: Secondary | ICD-10-CM | POA: Diagnosis not present

## 2020-08-09 DIAGNOSIS — I6381 Other cerebral infarction due to occlusion or stenosis of small artery: Secondary | ICD-10-CM | POA: Diagnosis not present

## 2020-08-09 DIAGNOSIS — H532 Diplopia: Secondary | ICD-10-CM | POA: Diagnosis present

## 2020-08-09 DIAGNOSIS — M810 Age-related osteoporosis without current pathological fracture: Secondary | ICD-10-CM | POA: Diagnosis present

## 2020-08-09 DIAGNOSIS — I739 Peripheral vascular disease, unspecified: Secondary | ICD-10-CM | POA: Diagnosis present

## 2020-08-09 DIAGNOSIS — I6330 Cerebral infarction due to thrombosis of unspecified cerebral artery: Secondary | ICD-10-CM | POA: Diagnosis not present

## 2020-08-10 DIAGNOSIS — J449 Chronic obstructive pulmonary disease, unspecified: Secondary | ICD-10-CM | POA: Diagnosis not present

## 2020-08-10 DIAGNOSIS — I6381 Other cerebral infarction due to occlusion or stenosis of small artery: Secondary | ICD-10-CM | POA: Diagnosis not present

## 2020-08-10 DIAGNOSIS — C61 Malignant neoplasm of prostate: Secondary | ICD-10-CM | POA: Diagnosis not present

## 2020-08-10 DIAGNOSIS — Z7409 Other reduced mobility: Secondary | ICD-10-CM | POA: Diagnosis not present

## 2020-08-10 DIAGNOSIS — I10 Essential (primary) hypertension: Secondary | ICD-10-CM | POA: Diagnosis not present

## 2020-08-12 DIAGNOSIS — I10 Essential (primary) hypertension: Secondary | ICD-10-CM | POA: Diagnosis not present

## 2020-08-12 DIAGNOSIS — I6330 Cerebral infarction due to thrombosis of unspecified cerebral artery: Secondary | ICD-10-CM | POA: Diagnosis not present

## 2020-08-12 DIAGNOSIS — Z7409 Other reduced mobility: Secondary | ICD-10-CM | POA: Diagnosis not present

## 2020-08-12 DIAGNOSIS — J449 Chronic obstructive pulmonary disease, unspecified: Secondary | ICD-10-CM | POA: Diagnosis not present

## 2020-08-13 DIAGNOSIS — I10 Essential (primary) hypertension: Secondary | ICD-10-CM | POA: Diagnosis not present

## 2020-08-13 DIAGNOSIS — I6330 Cerebral infarction due to thrombosis of unspecified cerebral artery: Secondary | ICD-10-CM | POA: Diagnosis not present

## 2020-08-13 DIAGNOSIS — J449 Chronic obstructive pulmonary disease, unspecified: Secondary | ICD-10-CM | POA: Diagnosis not present

## 2020-08-13 DIAGNOSIS — Z7409 Other reduced mobility: Secondary | ICD-10-CM | POA: Diagnosis not present

## 2020-08-28 DIAGNOSIS — I69354 Hemiplegia and hemiparesis following cerebral infarction affecting left non-dominant side: Secondary | ICD-10-CM | POA: Diagnosis not present

## 2020-08-28 DIAGNOSIS — I739 Peripheral vascular disease, unspecified: Secondary | ICD-10-CM | POA: Diagnosis not present

## 2020-08-28 DIAGNOSIS — M810 Age-related osteoporosis without current pathological fracture: Secondary | ICD-10-CM | POA: Diagnosis not present

## 2020-08-28 DIAGNOSIS — M9390 Osteochondropathy, unspecified of unspecified site: Secondary | ICD-10-CM | POA: Diagnosis not present

## 2020-08-28 DIAGNOSIS — R32 Unspecified urinary incontinence: Secondary | ICD-10-CM | POA: Diagnosis not present

## 2020-08-28 DIAGNOSIS — Z9181 History of falling: Secondary | ICD-10-CM | POA: Diagnosis not present

## 2020-08-28 DIAGNOSIS — Z7902 Long term (current) use of antithrombotics/antiplatelets: Secondary | ICD-10-CM | POA: Diagnosis not present

## 2020-08-28 DIAGNOSIS — Z993 Dependence on wheelchair: Secondary | ICD-10-CM | POA: Diagnosis not present

## 2020-08-28 DIAGNOSIS — I10 Essential (primary) hypertension: Secondary | ICD-10-CM | POA: Diagnosis not present

## 2020-08-28 DIAGNOSIS — E785 Hyperlipidemia, unspecified: Secondary | ICD-10-CM | POA: Diagnosis not present

## 2020-08-28 DIAGNOSIS — I083 Combined rheumatic disorders of mitral, aortic and tricuspid valves: Secondary | ICD-10-CM | POA: Diagnosis not present

## 2020-08-28 DIAGNOSIS — Z7982 Long term (current) use of aspirin: Secondary | ICD-10-CM | POA: Diagnosis not present

## 2020-08-28 DIAGNOSIS — I6300 Cerebral infarction due to thrombosis of unspecified precerebral artery: Secondary | ICD-10-CM | POA: Diagnosis not present

## 2020-08-28 DIAGNOSIS — C61 Malignant neoplasm of prostate: Secondary | ICD-10-CM | POA: Diagnosis not present

## 2020-08-28 DIAGNOSIS — J432 Centrilobular emphysema: Secondary | ICD-10-CM | POA: Diagnosis not present

## 2020-08-28 DIAGNOSIS — F17210 Nicotine dependence, cigarettes, uncomplicated: Secondary | ICD-10-CM | POA: Diagnosis not present

## 2020-08-28 DIAGNOSIS — I272 Pulmonary hypertension, unspecified: Secondary | ICD-10-CM | POA: Diagnosis not present

## 2020-08-31 DIAGNOSIS — I10 Essential (primary) hypertension: Secondary | ICD-10-CM | POA: Diagnosis not present

## 2020-08-31 DIAGNOSIS — I272 Pulmonary hypertension, unspecified: Secondary | ICD-10-CM | POA: Diagnosis not present

## 2020-08-31 DIAGNOSIS — I083 Combined rheumatic disorders of mitral, aortic and tricuspid valves: Secondary | ICD-10-CM | POA: Diagnosis not present

## 2020-08-31 DIAGNOSIS — I69354 Hemiplegia and hemiparesis following cerebral infarction affecting left non-dominant side: Secondary | ICD-10-CM | POA: Diagnosis not present

## 2020-08-31 DIAGNOSIS — J432 Centrilobular emphysema: Secondary | ICD-10-CM | POA: Diagnosis not present

## 2020-08-31 DIAGNOSIS — I739 Peripheral vascular disease, unspecified: Secondary | ICD-10-CM | POA: Diagnosis not present

## 2020-09-06 DIAGNOSIS — I272 Pulmonary hypertension, unspecified: Secondary | ICD-10-CM | POA: Diagnosis not present

## 2020-09-06 DIAGNOSIS — I083 Combined rheumatic disorders of mitral, aortic and tricuspid valves: Secondary | ICD-10-CM | POA: Diagnosis not present

## 2020-09-06 DIAGNOSIS — I739 Peripheral vascular disease, unspecified: Secondary | ICD-10-CM | POA: Diagnosis not present

## 2020-09-06 DIAGNOSIS — I69354 Hemiplegia and hemiparesis following cerebral infarction affecting left non-dominant side: Secondary | ICD-10-CM | POA: Diagnosis not present

## 2020-09-06 DIAGNOSIS — J432 Centrilobular emphysema: Secondary | ICD-10-CM | POA: Diagnosis not present

## 2020-09-06 DIAGNOSIS — I10 Essential (primary) hypertension: Secondary | ICD-10-CM | POA: Diagnosis not present

## 2020-09-07 DIAGNOSIS — I739 Peripheral vascular disease, unspecified: Secondary | ICD-10-CM | POA: Diagnosis not present

## 2020-09-07 DIAGNOSIS — I69354 Hemiplegia and hemiparesis following cerebral infarction affecting left non-dominant side: Secondary | ICD-10-CM | POA: Diagnosis not present

## 2020-09-07 DIAGNOSIS — I10 Essential (primary) hypertension: Secondary | ICD-10-CM | POA: Diagnosis not present

## 2020-09-07 DIAGNOSIS — I272 Pulmonary hypertension, unspecified: Secondary | ICD-10-CM | POA: Diagnosis not present

## 2020-09-07 DIAGNOSIS — I083 Combined rheumatic disorders of mitral, aortic and tricuspid valves: Secondary | ICD-10-CM | POA: Diagnosis not present

## 2020-09-07 DIAGNOSIS — J432 Centrilobular emphysema: Secondary | ICD-10-CM | POA: Diagnosis not present

## 2020-09-11 DIAGNOSIS — I272 Pulmonary hypertension, unspecified: Secondary | ICD-10-CM | POA: Diagnosis not present

## 2020-09-11 DIAGNOSIS — I69354 Hemiplegia and hemiparesis following cerebral infarction affecting left non-dominant side: Secondary | ICD-10-CM | POA: Diagnosis not present

## 2020-09-11 DIAGNOSIS — I739 Peripheral vascular disease, unspecified: Secondary | ICD-10-CM | POA: Diagnosis not present

## 2020-09-11 DIAGNOSIS — I10 Essential (primary) hypertension: Secondary | ICD-10-CM | POA: Diagnosis not present

## 2020-09-11 DIAGNOSIS — I083 Combined rheumatic disorders of mitral, aortic and tricuspid valves: Secondary | ICD-10-CM | POA: Diagnosis not present

## 2020-09-11 DIAGNOSIS — J432 Centrilobular emphysema: Secondary | ICD-10-CM | POA: Diagnosis not present

## 2020-09-15 DIAGNOSIS — I739 Peripheral vascular disease, unspecified: Secondary | ICD-10-CM | POA: Diagnosis not present

## 2020-09-15 DIAGNOSIS — I083 Combined rheumatic disorders of mitral, aortic and tricuspid valves: Secondary | ICD-10-CM | POA: Diagnosis not present

## 2020-09-15 DIAGNOSIS — I272 Pulmonary hypertension, unspecified: Secondary | ICD-10-CM | POA: Diagnosis not present

## 2020-09-15 DIAGNOSIS — I10 Essential (primary) hypertension: Secondary | ICD-10-CM | POA: Diagnosis not present

## 2020-09-15 DIAGNOSIS — J432 Centrilobular emphysema: Secondary | ICD-10-CM | POA: Diagnosis not present

## 2020-09-15 DIAGNOSIS — I69354 Hemiplegia and hemiparesis following cerebral infarction affecting left non-dominant side: Secondary | ICD-10-CM | POA: Diagnosis not present

## 2020-09-18 DIAGNOSIS — I10 Essential (primary) hypertension: Secondary | ICD-10-CM | POA: Diagnosis not present

## 2020-09-18 DIAGNOSIS — I69354 Hemiplegia and hemiparesis following cerebral infarction affecting left non-dominant side: Secondary | ICD-10-CM | POA: Diagnosis not present

## 2020-09-18 DIAGNOSIS — I083 Combined rheumatic disorders of mitral, aortic and tricuspid valves: Secondary | ICD-10-CM | POA: Diagnosis not present

## 2020-09-18 DIAGNOSIS — J432 Centrilobular emphysema: Secondary | ICD-10-CM | POA: Diagnosis not present

## 2020-09-18 DIAGNOSIS — I739 Peripheral vascular disease, unspecified: Secondary | ICD-10-CM | POA: Diagnosis not present

## 2020-09-18 DIAGNOSIS — I272 Pulmonary hypertension, unspecified: Secondary | ICD-10-CM | POA: Diagnosis not present

## 2020-09-22 DIAGNOSIS — J432 Centrilobular emphysema: Secondary | ICD-10-CM | POA: Diagnosis not present

## 2020-09-22 DIAGNOSIS — I10 Essential (primary) hypertension: Secondary | ICD-10-CM | POA: Diagnosis not present

## 2020-09-22 DIAGNOSIS — I083 Combined rheumatic disorders of mitral, aortic and tricuspid valves: Secondary | ICD-10-CM | POA: Diagnosis not present

## 2020-09-22 DIAGNOSIS — I272 Pulmonary hypertension, unspecified: Secondary | ICD-10-CM | POA: Diagnosis not present

## 2020-09-22 DIAGNOSIS — I69354 Hemiplegia and hemiparesis following cerebral infarction affecting left non-dominant side: Secondary | ICD-10-CM | POA: Diagnosis not present

## 2020-09-22 DIAGNOSIS — I739 Peripheral vascular disease, unspecified: Secondary | ICD-10-CM | POA: Diagnosis not present

## 2020-09-27 DIAGNOSIS — I272 Pulmonary hypertension, unspecified: Secondary | ICD-10-CM | POA: Diagnosis not present

## 2020-09-27 DIAGNOSIS — I083 Combined rheumatic disorders of mitral, aortic and tricuspid valves: Secondary | ICD-10-CM | POA: Diagnosis not present

## 2020-09-27 DIAGNOSIS — I69354 Hemiplegia and hemiparesis following cerebral infarction affecting left non-dominant side: Secondary | ICD-10-CM | POA: Diagnosis not present

## 2020-09-27 DIAGNOSIS — I6300 Cerebral infarction due to thrombosis of unspecified precerebral artery: Secondary | ICD-10-CM | POA: Diagnosis not present

## 2020-09-27 DIAGNOSIS — M9390 Osteochondropathy, unspecified of unspecified site: Secondary | ICD-10-CM | POA: Diagnosis not present

## 2020-09-27 DIAGNOSIS — M810 Age-related osteoporosis without current pathological fracture: Secondary | ICD-10-CM | POA: Diagnosis not present

## 2020-09-27 DIAGNOSIS — J432 Centrilobular emphysema: Secondary | ICD-10-CM | POA: Diagnosis not present

## 2020-09-27 DIAGNOSIS — E785 Hyperlipidemia, unspecified: Secondary | ICD-10-CM | POA: Diagnosis not present

## 2020-09-27 DIAGNOSIS — R32 Unspecified urinary incontinence: Secondary | ICD-10-CM | POA: Diagnosis not present

## 2020-09-27 DIAGNOSIS — Z9181 History of falling: Secondary | ICD-10-CM | POA: Diagnosis not present

## 2020-09-27 DIAGNOSIS — Z7902 Long term (current) use of antithrombotics/antiplatelets: Secondary | ICD-10-CM | POA: Diagnosis not present

## 2020-09-27 DIAGNOSIS — F17210 Nicotine dependence, cigarettes, uncomplicated: Secondary | ICD-10-CM | POA: Diagnosis not present

## 2020-09-27 DIAGNOSIS — Z7982 Long term (current) use of aspirin: Secondary | ICD-10-CM | POA: Diagnosis not present

## 2020-09-27 DIAGNOSIS — I10 Essential (primary) hypertension: Secondary | ICD-10-CM | POA: Diagnosis not present

## 2020-09-27 DIAGNOSIS — Z993 Dependence on wheelchair: Secondary | ICD-10-CM | POA: Diagnosis not present

## 2020-09-27 DIAGNOSIS — I739 Peripheral vascular disease, unspecified: Secondary | ICD-10-CM | POA: Diagnosis not present

## 2020-09-27 DIAGNOSIS — C61 Malignant neoplasm of prostate: Secondary | ICD-10-CM | POA: Diagnosis not present

## 2020-10-06 DIAGNOSIS — I739 Peripheral vascular disease, unspecified: Secondary | ICD-10-CM | POA: Diagnosis not present

## 2020-10-06 DIAGNOSIS — I083 Combined rheumatic disorders of mitral, aortic and tricuspid valves: Secondary | ICD-10-CM | POA: Diagnosis not present

## 2020-10-06 DIAGNOSIS — J432 Centrilobular emphysema: Secondary | ICD-10-CM | POA: Diagnosis not present

## 2020-10-06 DIAGNOSIS — I10 Essential (primary) hypertension: Secondary | ICD-10-CM | POA: Diagnosis not present

## 2020-10-06 DIAGNOSIS — I272 Pulmonary hypertension, unspecified: Secondary | ICD-10-CM | POA: Diagnosis not present

## 2020-10-06 DIAGNOSIS — I69354 Hemiplegia and hemiparesis following cerebral infarction affecting left non-dominant side: Secondary | ICD-10-CM | POA: Diagnosis not present

## 2020-10-09 DIAGNOSIS — I272 Pulmonary hypertension, unspecified: Secondary | ICD-10-CM | POA: Diagnosis not present

## 2020-10-09 DIAGNOSIS — I10 Essential (primary) hypertension: Secondary | ICD-10-CM | POA: Diagnosis not present

## 2020-10-09 DIAGNOSIS — I739 Peripheral vascular disease, unspecified: Secondary | ICD-10-CM | POA: Diagnosis not present

## 2020-10-09 DIAGNOSIS — I083 Combined rheumatic disorders of mitral, aortic and tricuspid valves: Secondary | ICD-10-CM | POA: Diagnosis not present

## 2020-10-09 DIAGNOSIS — I69354 Hemiplegia and hemiparesis following cerebral infarction affecting left non-dominant side: Secondary | ICD-10-CM | POA: Diagnosis not present

## 2020-10-09 DIAGNOSIS — J432 Centrilobular emphysema: Secondary | ICD-10-CM | POA: Diagnosis not present

## 2020-10-11 DIAGNOSIS — I69354 Hemiplegia and hemiparesis following cerebral infarction affecting left non-dominant side: Secondary | ICD-10-CM | POA: Diagnosis not present

## 2020-10-11 DIAGNOSIS — I739 Peripheral vascular disease, unspecified: Secondary | ICD-10-CM | POA: Diagnosis not present

## 2020-10-11 DIAGNOSIS — J432 Centrilobular emphysema: Secondary | ICD-10-CM | POA: Diagnosis not present

## 2020-10-11 DIAGNOSIS — I083 Combined rheumatic disorders of mitral, aortic and tricuspid valves: Secondary | ICD-10-CM | POA: Diagnosis not present

## 2020-10-11 DIAGNOSIS — I10 Essential (primary) hypertension: Secondary | ICD-10-CM | POA: Diagnosis not present

## 2020-10-11 DIAGNOSIS — I272 Pulmonary hypertension, unspecified: Secondary | ICD-10-CM | POA: Diagnosis not present

## 2020-10-17 DIAGNOSIS — I739 Peripheral vascular disease, unspecified: Secondary | ICD-10-CM | POA: Diagnosis not present

## 2020-10-17 DIAGNOSIS — I272 Pulmonary hypertension, unspecified: Secondary | ICD-10-CM | POA: Diagnosis not present

## 2020-10-17 DIAGNOSIS — J432 Centrilobular emphysema: Secondary | ICD-10-CM | POA: Diagnosis not present

## 2020-10-17 DIAGNOSIS — I69354 Hemiplegia and hemiparesis following cerebral infarction affecting left non-dominant side: Secondary | ICD-10-CM | POA: Diagnosis not present

## 2020-10-17 DIAGNOSIS — I10 Essential (primary) hypertension: Secondary | ICD-10-CM | POA: Diagnosis not present

## 2020-10-17 DIAGNOSIS — I083 Combined rheumatic disorders of mitral, aortic and tricuspid valves: Secondary | ICD-10-CM | POA: Diagnosis not present

## 2020-10-20 DIAGNOSIS — I083 Combined rheumatic disorders of mitral, aortic and tricuspid valves: Secondary | ICD-10-CM | POA: Diagnosis not present

## 2020-10-20 DIAGNOSIS — I69354 Hemiplegia and hemiparesis following cerebral infarction affecting left non-dominant side: Secondary | ICD-10-CM | POA: Diagnosis not present

## 2020-10-20 DIAGNOSIS — I10 Essential (primary) hypertension: Secondary | ICD-10-CM | POA: Diagnosis not present

## 2020-10-20 DIAGNOSIS — I272 Pulmonary hypertension, unspecified: Secondary | ICD-10-CM | POA: Diagnosis not present

## 2020-10-20 DIAGNOSIS — I739 Peripheral vascular disease, unspecified: Secondary | ICD-10-CM | POA: Diagnosis not present

## 2020-10-20 DIAGNOSIS — J432 Centrilobular emphysema: Secondary | ICD-10-CM | POA: Diagnosis not present

## 2020-10-26 DIAGNOSIS — I739 Peripheral vascular disease, unspecified: Secondary | ICD-10-CM | POA: Diagnosis not present

## 2020-10-26 DIAGNOSIS — I272 Pulmonary hypertension, unspecified: Secondary | ICD-10-CM | POA: Diagnosis not present

## 2020-10-26 DIAGNOSIS — I10 Essential (primary) hypertension: Secondary | ICD-10-CM | POA: Diagnosis not present

## 2020-10-26 DIAGNOSIS — J432 Centrilobular emphysema: Secondary | ICD-10-CM | POA: Diagnosis not present

## 2020-10-26 DIAGNOSIS — I69354 Hemiplegia and hemiparesis following cerebral infarction affecting left non-dominant side: Secondary | ICD-10-CM | POA: Diagnosis not present

## 2020-10-26 DIAGNOSIS — I083 Combined rheumatic disorders of mitral, aortic and tricuspid valves: Secondary | ICD-10-CM | POA: Diagnosis not present

## 2020-10-27 DIAGNOSIS — Z7902 Long term (current) use of antithrombotics/antiplatelets: Secondary | ICD-10-CM | POA: Diagnosis not present

## 2020-10-27 DIAGNOSIS — Z9181 History of falling: Secondary | ICD-10-CM | POA: Diagnosis not present

## 2020-10-27 DIAGNOSIS — I10 Essential (primary) hypertension: Secondary | ICD-10-CM | POA: Diagnosis not present

## 2020-10-27 DIAGNOSIS — F17210 Nicotine dependence, cigarettes, uncomplicated: Secondary | ICD-10-CM | POA: Diagnosis not present

## 2020-10-27 DIAGNOSIS — C61 Malignant neoplasm of prostate: Secondary | ICD-10-CM | POA: Diagnosis not present

## 2020-10-27 DIAGNOSIS — J432 Centrilobular emphysema: Secondary | ICD-10-CM | POA: Diagnosis not present

## 2020-10-27 DIAGNOSIS — M9390 Osteochondropathy, unspecified of unspecified site: Secondary | ICD-10-CM | POA: Diagnosis not present

## 2020-10-27 DIAGNOSIS — Z7982 Long term (current) use of aspirin: Secondary | ICD-10-CM | POA: Diagnosis not present

## 2020-10-27 DIAGNOSIS — E785 Hyperlipidemia, unspecified: Secondary | ICD-10-CM | POA: Diagnosis not present

## 2020-10-27 DIAGNOSIS — I083 Combined rheumatic disorders of mitral, aortic and tricuspid valves: Secondary | ICD-10-CM | POA: Diagnosis not present

## 2020-10-27 DIAGNOSIS — I739 Peripheral vascular disease, unspecified: Secondary | ICD-10-CM | POA: Diagnosis not present

## 2020-10-27 DIAGNOSIS — I69354 Hemiplegia and hemiparesis following cerebral infarction affecting left non-dominant side: Secondary | ICD-10-CM | POA: Diagnosis not present

## 2020-10-27 DIAGNOSIS — M810 Age-related osteoporosis without current pathological fracture: Secondary | ICD-10-CM | POA: Diagnosis not present

## 2020-10-27 DIAGNOSIS — I272 Pulmonary hypertension, unspecified: Secondary | ICD-10-CM | POA: Diagnosis not present

## 2020-10-27 DIAGNOSIS — Z993 Dependence on wheelchair: Secondary | ICD-10-CM | POA: Diagnosis not present

## 2020-10-30 DIAGNOSIS — I739 Peripheral vascular disease, unspecified: Secondary | ICD-10-CM | POA: Diagnosis not present

## 2020-10-30 DIAGNOSIS — I083 Combined rheumatic disorders of mitral, aortic and tricuspid valves: Secondary | ICD-10-CM | POA: Diagnosis not present

## 2020-10-30 DIAGNOSIS — I272 Pulmonary hypertension, unspecified: Secondary | ICD-10-CM | POA: Diagnosis not present

## 2020-10-30 DIAGNOSIS — I10 Essential (primary) hypertension: Secondary | ICD-10-CM | POA: Diagnosis not present

## 2020-10-30 DIAGNOSIS — I69354 Hemiplegia and hemiparesis following cerebral infarction affecting left non-dominant side: Secondary | ICD-10-CM | POA: Diagnosis not present

## 2020-10-30 DIAGNOSIS — J432 Centrilobular emphysema: Secondary | ICD-10-CM | POA: Diagnosis not present

## 2020-11-01 DIAGNOSIS — I69354 Hemiplegia and hemiparesis following cerebral infarction affecting left non-dominant side: Secondary | ICD-10-CM | POA: Diagnosis not present

## 2020-11-01 DIAGNOSIS — I10 Essential (primary) hypertension: Secondary | ICD-10-CM | POA: Diagnosis not present

## 2020-11-01 DIAGNOSIS — I083 Combined rheumatic disorders of mitral, aortic and tricuspid valves: Secondary | ICD-10-CM | POA: Diagnosis not present

## 2020-11-01 DIAGNOSIS — I272 Pulmonary hypertension, unspecified: Secondary | ICD-10-CM | POA: Diagnosis not present

## 2020-11-01 DIAGNOSIS — J432 Centrilobular emphysema: Secondary | ICD-10-CM | POA: Diagnosis not present

## 2020-11-01 DIAGNOSIS — I739 Peripheral vascular disease, unspecified: Secondary | ICD-10-CM | POA: Diagnosis not present

## 2020-11-07 DIAGNOSIS — I10 Essential (primary) hypertension: Secondary | ICD-10-CM | POA: Diagnosis not present

## 2020-11-07 DIAGNOSIS — I69354 Hemiplegia and hemiparesis following cerebral infarction affecting left non-dominant side: Secondary | ICD-10-CM | POA: Diagnosis not present

## 2020-11-07 DIAGNOSIS — J432 Centrilobular emphysema: Secondary | ICD-10-CM | POA: Diagnosis not present

## 2020-11-07 DIAGNOSIS — I739 Peripheral vascular disease, unspecified: Secondary | ICD-10-CM | POA: Diagnosis not present

## 2020-11-07 DIAGNOSIS — I083 Combined rheumatic disorders of mitral, aortic and tricuspid valves: Secondary | ICD-10-CM | POA: Diagnosis not present

## 2020-11-07 DIAGNOSIS — I272 Pulmonary hypertension, unspecified: Secondary | ICD-10-CM | POA: Diagnosis not present

## 2020-11-09 DIAGNOSIS — I739 Peripheral vascular disease, unspecified: Secondary | ICD-10-CM | POA: Diagnosis not present

## 2020-11-09 DIAGNOSIS — I083 Combined rheumatic disorders of mitral, aortic and tricuspid valves: Secondary | ICD-10-CM | POA: Diagnosis not present

## 2020-11-09 DIAGNOSIS — I69354 Hemiplegia and hemiparesis following cerebral infarction affecting left non-dominant side: Secondary | ICD-10-CM | POA: Diagnosis not present

## 2020-11-09 DIAGNOSIS — I272 Pulmonary hypertension, unspecified: Secondary | ICD-10-CM | POA: Diagnosis not present

## 2020-11-09 DIAGNOSIS — J432 Centrilobular emphysema: Secondary | ICD-10-CM | POA: Diagnosis not present

## 2020-11-09 DIAGNOSIS — I10 Essential (primary) hypertension: Secondary | ICD-10-CM | POA: Diagnosis not present

## 2020-11-22 DIAGNOSIS — I272 Pulmonary hypertension, unspecified: Secondary | ICD-10-CM | POA: Diagnosis not present

## 2020-11-22 DIAGNOSIS — J432 Centrilobular emphysema: Secondary | ICD-10-CM | POA: Diagnosis not present

## 2020-11-22 DIAGNOSIS — I10 Essential (primary) hypertension: Secondary | ICD-10-CM | POA: Diagnosis not present

## 2020-11-22 DIAGNOSIS — I69354 Hemiplegia and hemiparesis following cerebral infarction affecting left non-dominant side: Secondary | ICD-10-CM | POA: Diagnosis not present

## 2020-11-22 DIAGNOSIS — I739 Peripheral vascular disease, unspecified: Secondary | ICD-10-CM | POA: Diagnosis not present

## 2020-11-22 DIAGNOSIS — I083 Combined rheumatic disorders of mitral, aortic and tricuspid valves: Secondary | ICD-10-CM | POA: Diagnosis not present

## 2020-11-26 DIAGNOSIS — I69354 Hemiplegia and hemiparesis following cerebral infarction affecting left non-dominant side: Secondary | ICD-10-CM | POA: Diagnosis not present

## 2020-11-26 DIAGNOSIS — C61 Malignant neoplasm of prostate: Secondary | ICD-10-CM | POA: Diagnosis not present

## 2020-11-26 DIAGNOSIS — Z7902 Long term (current) use of antithrombotics/antiplatelets: Secondary | ICD-10-CM | POA: Diagnosis not present

## 2020-11-26 DIAGNOSIS — I272 Pulmonary hypertension, unspecified: Secondary | ICD-10-CM | POA: Diagnosis not present

## 2020-11-26 DIAGNOSIS — I083 Combined rheumatic disorders of mitral, aortic and tricuspid valves: Secondary | ICD-10-CM | POA: Diagnosis not present

## 2020-11-26 DIAGNOSIS — I739 Peripheral vascular disease, unspecified: Secondary | ICD-10-CM | POA: Diagnosis not present

## 2020-11-26 DIAGNOSIS — J432 Centrilobular emphysema: Secondary | ICD-10-CM | POA: Diagnosis not present

## 2020-11-26 DIAGNOSIS — E785 Hyperlipidemia, unspecified: Secondary | ICD-10-CM | POA: Diagnosis not present

## 2020-11-26 DIAGNOSIS — Z7982 Long term (current) use of aspirin: Secondary | ICD-10-CM | POA: Diagnosis not present

## 2020-11-26 DIAGNOSIS — Z9181 History of falling: Secondary | ICD-10-CM | POA: Diagnosis not present

## 2020-11-26 DIAGNOSIS — I10 Essential (primary) hypertension: Secondary | ICD-10-CM | POA: Diagnosis not present

## 2020-11-26 DIAGNOSIS — Z993 Dependence on wheelchair: Secondary | ICD-10-CM | POA: Diagnosis not present

## 2020-11-26 DIAGNOSIS — M9390 Osteochondropathy, unspecified of unspecified site: Secondary | ICD-10-CM | POA: Diagnosis not present

## 2020-11-26 DIAGNOSIS — M810 Age-related osteoporosis without current pathological fracture: Secondary | ICD-10-CM | POA: Diagnosis not present

## 2020-11-26 DIAGNOSIS — F17210 Nicotine dependence, cigarettes, uncomplicated: Secondary | ICD-10-CM | POA: Diagnosis not present

## 2020-11-27 DIAGNOSIS — C61 Malignant neoplasm of prostate: Secondary | ICD-10-CM | POA: Diagnosis not present

## 2020-11-28 DIAGNOSIS — I69354 Hemiplegia and hemiparesis following cerebral infarction affecting left non-dominant side: Secondary | ICD-10-CM | POA: Diagnosis not present

## 2020-11-28 DIAGNOSIS — J432 Centrilobular emphysema: Secondary | ICD-10-CM | POA: Diagnosis not present

## 2020-11-28 DIAGNOSIS — I739 Peripheral vascular disease, unspecified: Secondary | ICD-10-CM | POA: Diagnosis not present

## 2020-11-28 DIAGNOSIS — I083 Combined rheumatic disorders of mitral, aortic and tricuspid valves: Secondary | ICD-10-CM | POA: Diagnosis not present

## 2020-11-28 DIAGNOSIS — I10 Essential (primary) hypertension: Secondary | ICD-10-CM | POA: Diagnosis not present

## 2020-11-28 DIAGNOSIS — I272 Pulmonary hypertension, unspecified: Secondary | ICD-10-CM | POA: Diagnosis not present

## 2020-12-06 DIAGNOSIS — I083 Combined rheumatic disorders of mitral, aortic and tricuspid valves: Secondary | ICD-10-CM | POA: Diagnosis not present

## 2020-12-06 DIAGNOSIS — J432 Centrilobular emphysema: Secondary | ICD-10-CM | POA: Diagnosis not present

## 2020-12-06 DIAGNOSIS — I10 Essential (primary) hypertension: Secondary | ICD-10-CM | POA: Diagnosis not present

## 2020-12-06 DIAGNOSIS — I69354 Hemiplegia and hemiparesis following cerebral infarction affecting left non-dominant side: Secondary | ICD-10-CM | POA: Diagnosis not present

## 2020-12-06 DIAGNOSIS — I272 Pulmonary hypertension, unspecified: Secondary | ICD-10-CM | POA: Diagnosis not present

## 2020-12-06 DIAGNOSIS — I739 Peripheral vascular disease, unspecified: Secondary | ICD-10-CM | POA: Diagnosis not present

## 2020-12-07 DIAGNOSIS — I083 Combined rheumatic disorders of mitral, aortic and tricuspid valves: Secondary | ICD-10-CM | POA: Diagnosis not present

## 2020-12-07 DIAGNOSIS — I10 Essential (primary) hypertension: Secondary | ICD-10-CM | POA: Diagnosis not present

## 2020-12-07 DIAGNOSIS — I272 Pulmonary hypertension, unspecified: Secondary | ICD-10-CM | POA: Diagnosis not present

## 2020-12-07 DIAGNOSIS — I739 Peripheral vascular disease, unspecified: Secondary | ICD-10-CM | POA: Diagnosis not present

## 2020-12-07 DIAGNOSIS — J432 Centrilobular emphysema: Secondary | ICD-10-CM | POA: Diagnosis not present

## 2020-12-07 DIAGNOSIS — I69354 Hemiplegia and hemiparesis following cerebral infarction affecting left non-dominant side: Secondary | ICD-10-CM | POA: Diagnosis not present

## 2020-12-13 DIAGNOSIS — I083 Combined rheumatic disorders of mitral, aortic and tricuspid valves: Secondary | ICD-10-CM | POA: Diagnosis not present

## 2020-12-13 DIAGNOSIS — I69354 Hemiplegia and hemiparesis following cerebral infarction affecting left non-dominant side: Secondary | ICD-10-CM | POA: Diagnosis not present

## 2020-12-13 DIAGNOSIS — I10 Essential (primary) hypertension: Secondary | ICD-10-CM | POA: Diagnosis not present

## 2020-12-13 DIAGNOSIS — I739 Peripheral vascular disease, unspecified: Secondary | ICD-10-CM | POA: Diagnosis not present

## 2020-12-13 DIAGNOSIS — J432 Centrilobular emphysema: Secondary | ICD-10-CM | POA: Diagnosis not present

## 2020-12-13 DIAGNOSIS — I272 Pulmonary hypertension, unspecified: Secondary | ICD-10-CM | POA: Diagnosis not present

## 2020-12-14 DIAGNOSIS — C61 Malignant neoplasm of prostate: Secondary | ICD-10-CM | POA: Diagnosis not present

## 2020-12-14 DIAGNOSIS — M818 Other osteoporosis without current pathological fracture: Secondary | ICD-10-CM | POA: Diagnosis not present

## 2020-12-14 DIAGNOSIS — C7951 Secondary malignant neoplasm of bone: Secondary | ICD-10-CM | POA: Diagnosis not present

## 2020-12-20 DIAGNOSIS — I10 Essential (primary) hypertension: Secondary | ICD-10-CM | POA: Diagnosis not present

## 2020-12-20 DIAGNOSIS — J432 Centrilobular emphysema: Secondary | ICD-10-CM | POA: Diagnosis not present

## 2020-12-20 DIAGNOSIS — I739 Peripheral vascular disease, unspecified: Secondary | ICD-10-CM | POA: Diagnosis not present

## 2020-12-20 DIAGNOSIS — I272 Pulmonary hypertension, unspecified: Secondary | ICD-10-CM | POA: Diagnosis not present

## 2020-12-20 DIAGNOSIS — I083 Combined rheumatic disorders of mitral, aortic and tricuspid valves: Secondary | ICD-10-CM | POA: Diagnosis not present

## 2020-12-20 DIAGNOSIS — I69354 Hemiplegia and hemiparesis following cerebral infarction affecting left non-dominant side: Secondary | ICD-10-CM | POA: Diagnosis not present

## 2021-01-11 DIAGNOSIS — I69898 Other sequelae of other cerebrovascular disease: Secondary | ICD-10-CM | POA: Diagnosis not present

## 2021-01-11 DIAGNOSIS — I10 Essential (primary) hypertension: Secondary | ICD-10-CM | POA: Diagnosis not present

## 2021-01-11 DIAGNOSIS — Z7982 Long term (current) use of aspirin: Secondary | ICD-10-CM | POA: Diagnosis not present

## 2021-01-11 DIAGNOSIS — I723 Aneurysm of iliac artery: Secondary | ICD-10-CM | POA: Diagnosis not present

## 2021-01-11 DIAGNOSIS — I714 Abdominal aortic aneurysm, without rupture: Secondary | ICD-10-CM | POA: Diagnosis not present

## 2021-01-11 DIAGNOSIS — Z95828 Presence of other vascular implants and grafts: Secondary | ICD-10-CM | POA: Diagnosis not present

## 2021-01-11 DIAGNOSIS — F17210 Nicotine dependence, cigarettes, uncomplicated: Secondary | ICD-10-CM | POA: Diagnosis not present

## 2021-01-11 DIAGNOSIS — E785 Hyperlipidemia, unspecified: Secondary | ICD-10-CM | POA: Diagnosis not present

## 2021-01-11 DIAGNOSIS — Z7902 Long term (current) use of antithrombotics/antiplatelets: Secondary | ICD-10-CM | POA: Diagnosis not present

## 2021-03-15 DIAGNOSIS — C61 Malignant neoplasm of prostate: Secondary | ICD-10-CM | POA: Diagnosis not present

## 2021-03-22 DIAGNOSIS — C61 Malignant neoplasm of prostate: Secondary | ICD-10-CM | POA: Diagnosis not present

## 2021-03-22 DIAGNOSIS — M818 Other osteoporosis without current pathological fracture: Secondary | ICD-10-CM | POA: Diagnosis not present

## 2021-03-22 DIAGNOSIS — C7951 Secondary malignant neoplasm of bone: Secondary | ICD-10-CM | POA: Diagnosis not present

## 2021-03-22 DIAGNOSIS — D090 Carcinoma in situ of bladder: Secondary | ICD-10-CM | POA: Diagnosis not present

## 2021-04-17 ENCOUNTER — Emergency Department (HOSPITAL_COMMUNITY): Payer: No Typology Code available for payment source

## 2021-04-17 ENCOUNTER — Inpatient Hospital Stay (HOSPITAL_COMMUNITY)
Admission: EM | Admit: 2021-04-17 | Discharge: 2021-04-27 | DRG: 190 | Disposition: A | Discharge status: Home Health | Payer: No Typology Code available for payment source | Attending: Hospitalist | Admitting: Hospitalist

## 2021-04-17 DIAGNOSIS — R310 Gross hematuria: Secondary | ICD-10-CM | POA: Diagnosis present

## 2021-04-17 DIAGNOSIS — Z8546 Personal history of malignant neoplasm of prostate: Secondary | ICD-10-CM | POA: Diagnosis not present

## 2021-04-17 DIAGNOSIS — G928 Other toxic encephalopathy: Secondary | ICD-10-CM | POA: Diagnosis present

## 2021-04-17 DIAGNOSIS — E871 Hypo-osmolality and hyponatremia: Secondary | ICD-10-CM | POA: Diagnosis present

## 2021-04-17 DIAGNOSIS — J441 Chronic obstructive pulmonary disease with (acute) exacerbation: Secondary | ICD-10-CM | POA: Diagnosis not present

## 2021-04-17 DIAGNOSIS — Z7189 Other specified counseling: Secondary | ICD-10-CM | POA: Diagnosis not present

## 2021-04-17 DIAGNOSIS — Z8551 Personal history of malignant neoplasm of bladder: Secondary | ICD-10-CM

## 2021-04-17 DIAGNOSIS — J9601 Acute respiratory failure with hypoxia: Secondary | ICD-10-CM | POA: Diagnosis not present

## 2021-04-17 DIAGNOSIS — J9690 Respiratory failure, unspecified, unspecified whether with hypoxia or hypercapnia: Secondary | ICD-10-CM | POA: Diagnosis present

## 2021-04-17 DIAGNOSIS — J9602 Acute respiratory failure with hypercapnia: Secondary | ICD-10-CM | POA: Diagnosis not present

## 2021-04-17 DIAGNOSIS — J9622 Acute and chronic respiratory failure with hypercapnia: Secondary | ICD-10-CM | POA: Diagnosis present

## 2021-04-17 DIAGNOSIS — J449 Chronic obstructive pulmonary disease, unspecified: Secondary | ICD-10-CM | POA: Diagnosis not present

## 2021-04-17 DIAGNOSIS — Z9119 Patient's noncompliance with other medical treatment and regimen: Secondary | ICD-10-CM | POA: Diagnosis not present

## 2021-04-17 DIAGNOSIS — D6859 Other primary thrombophilia: Secondary | ICD-10-CM | POA: Diagnosis present

## 2021-04-17 DIAGNOSIS — Z515 Encounter for palliative care: Secondary | ICD-10-CM | POA: Diagnosis not present

## 2021-04-17 DIAGNOSIS — Z7983 Long term (current) use of bisphosphonates: Secondary | ICD-10-CM

## 2021-04-17 DIAGNOSIS — I480 Paroxysmal atrial fibrillation: Secondary | ICD-10-CM | POA: Diagnosis not present

## 2021-04-17 DIAGNOSIS — E872 Acidosis: Secondary | ICD-10-CM | POA: Diagnosis present

## 2021-04-17 DIAGNOSIS — R410 Disorientation, unspecified: Secondary | ICD-10-CM | POA: Diagnosis not present

## 2021-04-17 DIAGNOSIS — I10 Essential (primary) hypertension: Secondary | ICD-10-CM | POA: Diagnosis not present

## 2021-04-17 DIAGNOSIS — Z79899 Other long term (current) drug therapy: Secondary | ICD-10-CM

## 2021-04-17 DIAGNOSIS — J189 Pneumonia, unspecified organism: Secondary | ICD-10-CM | POA: Diagnosis not present

## 2021-04-17 DIAGNOSIS — J9612 Chronic respiratory failure with hypercapnia: Secondary | ICD-10-CM | POA: Diagnosis present

## 2021-04-17 DIAGNOSIS — J80 Acute respiratory distress syndrome: Secondary | ICD-10-CM | POA: Diagnosis not present

## 2021-04-17 DIAGNOSIS — Z7982 Long term (current) use of aspirin: Secondary | ICD-10-CM | POA: Diagnosis not present

## 2021-04-17 DIAGNOSIS — G9341 Metabolic encephalopathy: Secondary | ICD-10-CM | POA: Diagnosis not present

## 2021-04-17 DIAGNOSIS — J9621 Acute and chronic respiratory failure with hypoxia: Secondary | ICD-10-CM | POA: Diagnosis present

## 2021-04-17 DIAGNOSIS — Z7951 Long term (current) use of inhaled steroids: Secondary | ICD-10-CM | POA: Diagnosis not present

## 2021-04-17 DIAGNOSIS — Z8673 Personal history of transient ischemic attack (TIA), and cerebral infarction without residual deficits: Secondary | ICD-10-CM | POA: Diagnosis not present

## 2021-04-17 DIAGNOSIS — L8990 Pressure ulcer of unspecified site, unspecified stage: Secondary | ICD-10-CM | POA: Insufficient documentation

## 2021-04-17 DIAGNOSIS — R0902 Hypoxemia: Secondary | ICD-10-CM | POA: Diagnosis not present

## 2021-04-17 DIAGNOSIS — J9611 Chronic respiratory failure with hypoxia: Secondary | ICD-10-CM | POA: Diagnosis present

## 2021-04-17 DIAGNOSIS — R0602 Shortness of breath: Secondary | ICD-10-CM | POA: Diagnosis not present

## 2021-04-17 DIAGNOSIS — R092 Respiratory arrest: Secondary | ICD-10-CM | POA: Diagnosis not present

## 2021-04-17 DIAGNOSIS — Z7401 Bed confinement status: Secondary | ICD-10-CM | POA: Diagnosis not present

## 2021-04-17 DIAGNOSIS — F17210 Nicotine dependence, cigarettes, uncomplicated: Secondary | ICD-10-CM | POA: Diagnosis present

## 2021-04-17 DIAGNOSIS — Z20822 Contact with and (suspected) exposure to covid-19: Secondary | ICD-10-CM | POA: Diagnosis present

## 2021-04-17 DIAGNOSIS — R0689 Other abnormalities of breathing: Secondary | ICD-10-CM | POA: Diagnosis not present

## 2021-04-17 DIAGNOSIS — Z66 Do not resuscitate: Secondary | ICD-10-CM | POA: Diagnosis not present

## 2021-04-17 DIAGNOSIS — F10239 Alcohol dependence with withdrawal, unspecified: Secondary | ICD-10-CM | POA: Diagnosis present

## 2021-04-17 DIAGNOSIS — R069 Unspecified abnormalities of breathing: Secondary | ICD-10-CM

## 2021-04-17 DIAGNOSIS — R531 Weakness: Secondary | ICD-10-CM | POA: Diagnosis not present

## 2021-04-17 LAB — CORTISOL: Cortisol, Plasma: 13 ug/dL

## 2021-04-17 LAB — CBC
HCT: 35.2 % — ABNORMAL LOW (ref 39.0–52.0)
Hemoglobin: 10.6 g/dL — ABNORMAL LOW (ref 13.0–17.0)
MCH: 27.3 pg (ref 26.0–34.0)
MCHC: 30.1 g/dL (ref 30.0–36.0)
MCV: 90.7 fL (ref 80.0–100.0)
Platelets: 364 10*3/uL (ref 150–400)
RBC: 3.88 MIL/uL — ABNORMAL LOW (ref 4.22–5.81)
RDW: 17.2 % — ABNORMAL HIGH (ref 11.5–15.5)
WBC: 7.1 10*3/uL (ref 4.0–10.5)
nRBC: 0 % (ref 0.0–0.2)

## 2021-04-17 LAB — URINALYSIS, ROUTINE W REFLEX MICROSCOPIC
Bilirubin Urine: NEGATIVE
Glucose, UA: NEGATIVE mg/dL
Ketones, ur: NEGATIVE mg/dL
Nitrite: NEGATIVE
Protein, ur: NEGATIVE mg/dL
RBC / HPF: >50 RBC/hpf — ABNORMAL HIGH (ref 0–5)
Specific Gravity, Urine: 1.011 (ref 1.005–1.030)
pH: 6 (ref 5.0–8.0)

## 2021-04-17 LAB — RAPID URINE DRUG SCREEN, HOSP PERFORMED
Amphetamines: NOT DETECTED
Barbiturates: NOT DETECTED
Benzodiazepines: NOT DETECTED
Cocaine: NOT DETECTED
Opiates: NOT DETECTED
Tetrahydrocannabinol: NOT DETECTED

## 2021-04-17 LAB — MRSA NEXT GEN BY PCR, NASAL: MRSA by PCR Next Gen: NOT DETECTED

## 2021-04-17 LAB — RESP PANEL BY RT-PCR (FLU A&B, COVID) ARPGX2
Influenza A by PCR: NEGATIVE
Influenza B by PCR: NEGATIVE
SARS Coronavirus 2 by RT PCR: NEGATIVE

## 2021-04-17 LAB — COMPREHENSIVE METABOLIC PANEL
ALT: 8 U/L (ref 0–44)
ALT: 8 U/L (ref 0–44)
AST: 12 U/L — ABNORMAL LOW (ref 15–41)
AST: 14 U/L — ABNORMAL LOW (ref 15–41)
Albumin: 2.7 g/dL — ABNORMAL LOW (ref 3.5–5.0)
Albumin: 2.4 g/dL — ABNORMAL LOW (ref 3.5–5.0)
Alkaline Phosphatase: 104 U/L (ref 38–126)
Alkaline Phosphatase: 97 U/L (ref 38–126)
Anion gap: 8 (ref 5–15)
Anion gap: 10 (ref 5–15)
BUN: 9 mg/dL (ref 8–23)
BUN: 8 mg/dL (ref 8–23)
CO2: 30 mmol/L (ref 22–32)
CO2: 32 mmol/L (ref 22–32)
Calcium: 8.4 mg/dL — ABNORMAL LOW (ref 8.9–10.3)
Calcium: 8.1 mg/dL — ABNORMAL LOW (ref 8.9–10.3)
Chloride: 88 mmol/L — ABNORMAL LOW (ref 98–111)
Chloride: 88 mmol/L — ABNORMAL LOW (ref 98–111)
Creatinine, Ser: 0.77 mg/dL (ref 0.61–1.24)
Creatinine, Ser: 0.81 mg/dL (ref 0.61–1.24)
GFR, Estimated: >60 mL/min (ref >60)
GFR, Estimated: >60 mL/min (ref >60)
Glucose, Bld: 152 mg/dL — ABNORMAL HIGH (ref 70–99)
Glucose, Bld: 119 mg/dL — ABNORMAL HIGH (ref 70–99)
Potassium: 4.6 mmol/L (ref 3.5–5.1)
Potassium: 4.3 mmol/L (ref 3.5–5.1)
Sodium: 126 mmol/L — ABNORMAL LOW (ref 135–145)
Sodium: 130 mmol/L — ABNORMAL LOW (ref 135–145)
Total Bilirubin: 0.2 mg/dL — ABNORMAL LOW (ref 0.3–1.2)
Total Bilirubin: 0.4 mg/dL (ref 0.3–1.2)
Total Protein: 6.5 g/dL (ref 6.5–8.1)
Total Protein: 5.7 g/dL — ABNORMAL LOW (ref 6.5–8.1)

## 2021-04-17 LAB — I-STAT ARTERIAL BLOOD GAS, ED
Acid-Base Excess: 6 mmol/L — ABNORMAL HIGH (ref 0.0–2.0)
Acid-Base Excess: 1 mmol/L (ref 0.0–2.0)
Bicarbonate: 36.4 mmol/L — ABNORMAL HIGH (ref 20.0–28.0)
Bicarbonate: 30.3 mmol/L — ABNORMAL HIGH (ref 20.0–28.0)
Calcium, Ion: 1.19 mmol/L (ref 1.15–1.40)
Calcium, Ion: 1.17 mmol/L (ref 1.15–1.40)
HCT: 41 % (ref 39.0–52.0)
HCT: 38 % — ABNORMAL LOW (ref 39.0–52.0)
Hemoglobin: 13.9 g/dL (ref 13.0–17.0)
Hemoglobin: 12.9 g/dL — ABNORMAL LOW (ref 13.0–17.0)
O2 Saturation: 97 %
O2 Saturation: 95 %
Patient temperature: 97.4
Patient temperature: 97.4
Potassium: 4.5 mmol/L (ref 3.5–5.1)
Potassium: 4.5 mmol/L (ref 3.5–5.1)
Sodium: 127 mmol/L — ABNORMAL LOW (ref 135–145)
Sodium: 130 mmol/L — ABNORMAL LOW (ref 135–145)
TCO2: 39 mmol/L — ABNORMAL HIGH (ref 22–32)
TCO2: 32 mmol/L (ref 22–32)
pCO2 arterial: 83.6 mmHg (ref 32.0–48.0)
pCO2 arterial: 66.2 mmHg (ref 32.0–48.0)
pH, Arterial: 7.244 — ABNORMAL LOW (ref 7.350–7.450)
pH, Arterial: 7.265 — ABNORMAL LOW (ref 7.350–7.450)
pO2, Arterial: 108 mmHg (ref 83.0–108.0)
pO2, Arterial: 88 mmHg (ref 83.0–108.0)

## 2021-04-17 LAB — PHOSPHORUS: Phosphorus: 3.8 mg/dL (ref 2.5–4.6)

## 2021-04-17 LAB — CBC WITH DIFFERENTIAL/PLATELET
Abs Immature Granulocytes: 0.29 10*3/uL — ABNORMAL HIGH (ref 0.00–0.07)
Basophils Absolute: 0.1 10*3/uL (ref 0.0–0.1)
Basophils Relative: 0 %
Eosinophils Absolute: 0 10*3/uL (ref 0.0–0.5)
Eosinophils Relative: 0 %
HCT: 38.1 % — ABNORMAL LOW (ref 39.0–52.0)
Hemoglobin: 11.6 g/dL — ABNORMAL LOW (ref 13.0–17.0)
Immature Granulocytes: 3 %
Lymphocytes Relative: 5 %
Lymphs Abs: 0.5 10*3/uL — ABNORMAL LOW (ref 0.7–4.0)
MCH: 27.8 pg (ref 26.0–34.0)
MCHC: 30.4 g/dL (ref 30.0–36.0)
MCV: 91.4 fL (ref 80.0–100.0)
Monocytes Absolute: 0.6 10*3/uL (ref 0.1–1.0)
Monocytes Relative: 5 %
Neutro Abs: 10.2 10*3/uL — ABNORMAL HIGH (ref 1.7–7.7)
Neutrophils Relative %: 87 %
Platelets: 416 10*3/uL — ABNORMAL HIGH (ref 150–400)
RBC: 4.17 MIL/uL — ABNORMAL LOW (ref 4.22–5.81)
RDW: 17.3 % — ABNORMAL HIGH (ref 11.5–15.5)
WBC: 11.6 10*3/uL — ABNORMAL HIGH (ref 4.0–10.5)
nRBC: 0.2 % (ref 0.0–0.2)

## 2021-04-17 LAB — I-STAT VENOUS BLOOD GAS, ED
Acid-Base Excess: 4 mmol/L — ABNORMAL HIGH (ref 0.0–2.0)
Bicarbonate: 34.4 mmol/L — ABNORMAL HIGH (ref 20.0–28.0)
Calcium, Ion: 1.05 mmol/L — ABNORMAL LOW (ref 1.15–1.40)
HCT: 39 % (ref 39.0–52.0)
Hemoglobin: 13.3 g/dL (ref 13.0–17.0)
O2 Saturation: 49 %
Potassium: 4.6 mmol/L (ref 3.5–5.1)
Sodium: 129 mmol/L — ABNORMAL LOW (ref 135–145)
TCO2: 37 mmol/L — ABNORMAL HIGH (ref 22–32)
pCO2, Ven: 80.2 mmHg (ref 44.0–60.0)
pH, Ven: 7.241 — ABNORMAL LOW (ref 7.250–7.430)
pO2, Ven: 32 mmHg (ref 32.0–45.0)

## 2021-04-17 LAB — PROCALCITONIN: Procalcitonin: <0.1 ng/mL

## 2021-04-17 LAB — CBG MONITORING, ED: Glucose-Capillary: 150 mg/dL — ABNORMAL HIGH (ref 70–99)

## 2021-04-17 LAB — MAGNESIUM: Magnesium: 1.7 mg/dL (ref 1.7–2.4)

## 2021-04-17 LAB — PROTIME-INR
INR: 1 (ref 0.8–1.2)
INR: 1 (ref 0.8–1.2)
Prothrombin Time: 12.7 seconds (ref 11.4–15.2)
Prothrombin Time: 13 seconds (ref 11.4–15.2)

## 2021-04-17 LAB — APTT
aPTT: 28 seconds (ref 24–36)
aPTT: 31 seconds (ref 24–36)

## 2021-04-17 LAB — BRAIN NATRIURETIC PEPTIDE: B Natriuretic Peptide: 336.3 pg/mL — ABNORMAL HIGH (ref 0.0–100.0)

## 2021-04-17 LAB — TROPONIN I (HIGH SENSITIVITY)
Troponin I (High Sensitivity): 25 ng/L — ABNORMAL HIGH (ref <18)
Troponin I (High Sensitivity): 54 ng/L — ABNORMAL HIGH (ref <18)

## 2021-04-17 LAB — LACTIC ACID, PLASMA
Lactic Acid, Venous: 1.8 mmol/L (ref 0.5–1.9)
Lactic Acid, Venous: 1.6 mmol/L (ref 0.5–1.9)

## 2021-04-17 LAB — OSMOLALITY, URINE: Osmolality, Ur: 315 mOsm/kg (ref 300–900)

## 2021-04-17 LAB — OSMOLALITY: Osmolality: 278 mOsm/kg (ref 275–295)

## 2021-04-17 MED ORDER — HYDROXYZINE HCL 25 MG PO TABS: 25.0000 mg | ORAL_TABLET | Freq: Four times a day (QID) | ORAL | Status: AC | PRN

## 2021-04-17 MED ORDER — ALBUTEROL SULFATE (2.5 MG/3ML) 0.083% IN NEBU
INHALATION_SOLUTION | RESPIRATORY_TRACT | Status: AC
  Filled 2021-04-17: qty 3

## 2021-04-17 MED ORDER — ONDANSETRON HCL 4 MG/2ML IJ SOLN: 4.0000 mg | Freq: Four times a day (QID) | INTRAMUSCULAR | Status: DC | PRN

## 2021-04-17 MED ORDER — IPRATROPIUM-ALBUTEROL 0.5-2.5 (3) MG/3ML IN SOLN
3.0000 mL | Freq: Four times a day (QID) | RESPIRATORY_TRACT | Status: DC
  Administered 2021-04-17 – 2021-04-18 (×3): 3 mL via RESPIRATORY_TRACT
  Filled 2021-04-17 (×3): qty 3

## 2021-04-17 MED ORDER — BUDESONIDE 0.25 MG/2ML IN SUSP
0.2500 mg | Freq: Four times a day (QID) | RESPIRATORY_TRACT | Status: DC
  Administered 2021-04-17 – 2021-04-18 (×2): 0.25 mg via RESPIRATORY_TRACT
  Filled 2021-04-17 (×4): qty 2

## 2021-04-17 MED ORDER — POLYETHYLENE GLYCOL 3350 17 G PO PACK: 17.0000 g | PACK | Freq: Every day | ORAL | Status: DC | PRN

## 2021-04-17 MED ORDER — LOPERAMIDE HCL 2 MG PO CAPS
2.0000 mg | ORAL_CAPSULE | ORAL | Status: AC | PRN
  Filled 2021-04-17: qty 2

## 2021-04-17 MED ORDER — CHLORDIAZEPOXIDE HCL 25 MG PO CAPS
25.0000 mg | ORAL_CAPSULE | Freq: Four times a day (QID) | ORAL | Status: AC | PRN
  Administered 2021-04-17: 25 mg via ORAL
  Filled 2021-04-17: qty 1

## 2021-04-17 MED ORDER — HEPARIN SODIUM (PORCINE) 5000 UNIT/ML IJ SOLN
5000.0000 [IU] | Freq: Three times a day (TID) | INTRAMUSCULAR | Status: DC
  Administered 2021-04-17 – 2021-04-19 (×6): 5000 [IU] via SUBCUTANEOUS
  Filled 2021-04-17 (×6): qty 1

## 2021-04-17 MED ORDER — SODIUM CHLORIDE 0.9 % IV BOLUS
1000.0000 mL | Freq: Once | INTRAVENOUS | Status: AC
  Administered 2021-04-17: 1000 mL via INTRAVENOUS

## 2021-04-17 MED ORDER — PANTOPRAZOLE SODIUM 40 MG IV SOLR
40.0000 mg | Freq: Every day | INTRAVENOUS | Status: DC
  Administered 2021-04-17 – 2021-04-21 (×5): 40 mg via INTRAVENOUS
  Filled 2021-04-17 (×5): qty 40

## 2021-04-17 MED ORDER — SODIUM CHLORIDE 0.9 % IV SOLN: INTRAVENOUS | Status: DC

## 2021-04-17 MED ORDER — ALBUTEROL (5 MG/ML) CONTINUOUS INHALATION SOLN
10.0000 mg/h | INHALATION_SOLUTION | Freq: Once | RESPIRATORY_TRACT | Status: AC
  Administered 2021-04-17: 10 mg/h via RESPIRATORY_TRACT
  Filled 2021-04-17: qty 20

## 2021-04-17 MED ORDER — SODIUM CHLORIDE 0.9 % IV SOLN
500.0000 mg | INTRAVENOUS | Status: DC
  Administered 2021-04-17: 500 mg via INTRAVENOUS
  Filled 2021-04-17 (×2): qty 500

## 2021-04-17 MED ORDER — ARFORMOTEROL TARTRATE 15 MCG/2ML IN NEBU
15.0000 ug | INHALATION_SOLUTION | Freq: Two times a day (BID) | RESPIRATORY_TRACT | Status: DC
  Administered 2021-04-17: 15 ug via RESPIRATORY_TRACT
  Filled 2021-04-17 (×3): qty 2

## 2021-04-17 MED ORDER — METHYLPREDNISOLONE SODIUM SUCC 125 MG IJ SOLR
60.0000 mg | Freq: Three times a day (TID) | INTRAMUSCULAR | Status: DC
  Administered 2021-04-17 – 2021-04-18 (×3): 60 mg via INTRAVENOUS
  Filled 2021-04-17 (×3): qty 2

## 2021-04-17 MED ORDER — DOCUSATE SODIUM 100 MG PO CAPS: 100.0000 mg | ORAL_CAPSULE | Freq: Two times a day (BID) | ORAL | Status: DC | PRN

## 2021-04-17 MED ORDER — CHLORHEXIDINE GLUCONATE CLOTH 2 % EX PADS
6.0000 | MEDICATED_PAD | Freq: Every day | CUTANEOUS | Status: DC
  Administered 2021-04-18 – 2021-04-27 (×9): 6 via TOPICAL

## 2021-04-17 MED ORDER — SODIUM CHLORIDE 0.9 % IV SOLN
2.0000 g | INTRAVENOUS | Status: DC
  Administered 2021-04-17: 2 g via INTRAVENOUS
  Filled 2021-04-17: qty 20

## 2021-04-17 NOTE — Consult Note (Signed)
NAME:  Daniel King, MRN:  [id number], DOB:  11/14/1945, LOS: 0 ADMISSION DATE:  04/17/2021, CONSULTATION DATE:  04/17/21 REFERRING MD:  Daniel King CHIEF COMPLAINT:  Dyspnea   History of Present Illness:  Daniel King is a 75 y.o. male who has a PMH including COPD per report (no PFT's available in our system to confirm or assess severity). He presented to [facility] ED 6/14 with dyspnea x 4-5 days.  He had attempted to use his home inhalers without any response.  EMS was called and upon their arrival, his sats were found to be in the 40s.  He was placed on a NRB with improvement only to 31s.  He was subsequently placed on CPAP and transported to the ED.  In the ED, ABG demonstrated respiratory acidosis.  He was started on BiPAP.  VBG 3 hours later was essentially unchanged.  PCCM subsequently asked to see in consultation.  Pertinent  Medical History:  has Pain in left knee on their problem list. COPD. Prostate cancer EtOH abuse  Significant Hospital Events: Including procedures, antibiotic start and stop dates in addition to other pertinent events   6/14 > admit.  Micro Data: Flu 6/14 > neg. SARS CoV2 6/14 > neg.  Antibiotics: Ceftriaxone 6/14 >   Interim History / Subjective:  Monitor in intensive care unit he may need intubation in the near future.  Objective:  Blood pressure 126/67, pulse 99, temperature (!) 97.4 F (36.3 C), temperature source Axillary, resp. rate 19, height 6' (1.829 m), weight 63.5 kg, SpO2 97 %.    FiO2 (%):  [40 %-50 %] 40 %   Intake/Output Summary (Last 24 hours) at 04/17/2021 1159 Last data filed at 04/17/2021 1138 Gross per 24 hour  Intake 1350 ml  Output --  Net 1350 ml   Filed Weights   04/17/21 0831  Weight: 63.5 kg    Examination: General: Disheveled male currently on noninvasive and poorly responsive Neuro: Poorly responsive. Will withdrawal HEENT: No JVD or lymphadenopathy is appreciated full facemask BiPAP with Currently  extensive air leak and mask Cardiovascular: Heart sounds are distant Lungs: Decreased air movement currently on noninvasive mechanical ventilatory support with airleak Abdomen: Soft nontender positive bowel sounds Musculoskeletal: Grossly intact Skin: Warm and dry  Labs/imaging personally reviewed:  CXR 6/14 > possible RLL infiltrate.  Resolved Hospital Problem list:   Assessment & Plan:   AECOPD - no PFT's in our system to confirm his diagnosis (though suspect he does indeed have COPD) or to assess severity of it. Acute hypoxic and hypercapnic respiratory failure - 2/2 above. - Continue BiPAP for now for remainder of afternoon. - Solumedrol 60mg  q12hrs. - Budesonide / Brovana / Ipratropium nebs. _Community-acquired pneumonia treated with standard antibiotics - Hold home Stiolto, Spiriva, Albuterol. - 40mg  Lasix x 1. - Pulmonary hygiene. - Ambulate as able. - Ambulatory desaturation study prior to d/c to assess for home O2 needs. -He may require intubation due to the severity of his COPD combined with alcohol withdrawal  History of prostate cancer -Holding oncology medications for prostate cancer  Daily consumption of alcohol -Thiamine /folic acid -Sedation as needed    Best practice (evaluated daily):  Diet: NPO Pain/Anxiety/Delirium protocol (if indicated): Yes (RASS goal 0) VAP protocol (if indicated): vap DVT prophylaxis: Subcutaneous Heparin GI prophylaxis: N/A Glucose control:  SSI No Central venous access:  N/A Arterial line:  N/A Foley:  N/A Mobility:  bed rest  PT consulted: Yes Last date of multidisciplinary goals of  care discussion: To be determined Code Status:  full code Disposition: Admit to intensive care unit 04/17/2020 wife updated at bedside  Labs   CBC: Recent Labs  Lab 04/17/21 0844 04/17/21 0848 04/17/21 1109  WBC 11.6*  --   --   NEUTROABS 10.2*  --   --   HGB 11.6* 13.9 13.3  HCT 38.1* 41.0 39.0  MCV 91.4  --   --   PLT 416*  --    --     Basic Metabolic Panel: Recent Labs  Lab 04/17/21 0844 04/17/21 0848 04/17/21 1109  NA 126* 127* 129*  K 4.6 4.5 4.6  CL 88*  --   --   CO2 30  --   --   GLUCOSE 152*  --   --   BUN 9  --   --   CREATININE 0.77  --   --   CALCIUM 8.4*  --   --    GFR: Estimated Creatinine Clearance: 71.7 mL/min (by C-G formula based on SCr of 0.77 mg/dL). Recent Labs  Lab 04/17/21 0844 04/17/21 0845 04/17/21 1049  WBC 11.6*  --   --   LATICACIDVEN  --  1.8 1.6    Liver Function Tests: Recent Labs  Lab 04/17/21 0844  AST 12*  ALT 8  ALKPHOS 104  BILITOT 0.2*  PROT 6.5  ALBUMIN 2.7*   No results for input(s): LIPASE, AMYLASE in the last 168 hours. No results for input(s): AMMONIA in the last 168 hours.  ABG    Component Value Date/Time   PHART 7.244 (L) 04/17/2021 0848   PCO2ART 83.6 (HH) 04/17/2021 0848   PO2ART 108 04/17/2021 0848   HCO3 34.4 (H) 04/17/2021 1109   TCO2 37 (H) 04/17/2021 1109   O2SAT 49.0 04/17/2021 1109     Coagulation Profile: Recent Labs  Lab 04/17/21 0844  INR 1.0    Cardiac Enzymes: No results for input(s): CKTOTAL, CKMB, CKMBINDEX, TROPONINI in the last 168 hours.  HbA1C: No results found for: HGBA1C  CBG: Recent Labs  Lab 04/17/21 0840  GLUCAP 150*    Review of Systems:   NA  Past Medical History:  He,  has a past medical history of AAA (abdominal aortic aneurysm) ([place]), Bladder cancer ([place]), COPD (chronic obstructive pulmonary disease) ([place]), and Hypertension.   Surgical History:   Past Surgical History:  Procedure Laterality Date   ABDOMINAL AORTIC ANEURYSM REPAIR     PROSTATECTOMY       Social History:   reports that he has been smoking cigarettes. He has a 78.00 pack-year smoking history. He has never used smokeless tobacco. He reports current alcohol use of about 21.0 standard drinks of alcohol per week. He reports that he does not use drugs.   Family History:  His family history is not on file.    Allergies No Known Allergies   Home Medications  Prior to Admission medications   Medication Sig Start Date End Date Taking? Authorizing Provider  albuterol (PROVENTIL HFA;VENTOLIN HFA) 108 (90 BASE) MCG/ACT inhaler Inhale 2 puffs into the lungs every 6 (six) hours as needed. For shortness of breath.    [provider]  alendronate (FOSAMAX) 10 MG tablet Take by mouth.    [provider]  aspirin EC 81 MG tablet Take 81 mg by mouth. 11/09/16   [provider]  atorvastatin (LIPITOR) 10 MG tablet Take 10 mg by mouth. 11/08/16   [provider]  Calcium Carbonate-Vit D-Min (CALCIUM 1200 PO) Take  by mouth.    [provider]  cholecalciferol (VITAMIN D) 1000 UNITS tablet Take 1,000 Units by mouth daily.    [provider]  cyanocobalamin 100 MCG tablet Take 100 mcg by mouth daily.    [provider]  docusate sodium (COLACE) 100 MG capsule Take by mouth.    [provider]  enzalutamide Gillermina Phy) 40 MG capsule Take by mouth.    [provider]  ibuprofen (ADVIL,MOTRIN) 200 MG tablet Take 400 mg by mouth every 8 (eight) hours as needed. For pain.    [provider]  lisinopril (PRINIVIL,ZESTRIL) 5 MG tablet Take 5 mg by mouth daily.    [provider]  metoprolol tartrate (LOPRESSOR) 25 MG tablet Take 12.5 mg by mouth. 11/08/16   [provider]  Multiple Vitamin (MULTIVITAMIN WITH MINERALS) TABS Take 1 tablet by mouth daily.    [provider]  oxyCODONE-acetaminophen (PERCOCET/ROXICET) 5-325 MG per tablet Take 1-2 tablets by mouth every 6 (six) hours as needed for pain. Patient not taking: Reported on 08/17/2019 08/11/12   Carlisle Cater, PA-C  simvastatin (ZOCOR) 10 MG tablet Take 10 mg by mouth at bedtime.    [provider]  tiotropium (SPIRIVA) 18 MCG inhalation capsule Place 18 mcg into inhaler and inhale daily.    [provider]  Tiotropium Bromide-Olodaterol  (STIOLTO RESPIMAT) 2.5-2.5 MCG/ACT AERS Inhale into the lungs.    [provider]     Critical care time: [place] ACNP Acute Care Nurse Practitioner [place] Please consult [place] 04/17/2021, 12:19 PM

## 2021-04-17 NOTE — Progress Notes (Signed)
RT x2 attempted ABG multiple times with no success. MD made aware. Order changed to VBG.

## 2021-04-17 NOTE — ED Notes (Signed)
Report given to Idamae Lusher, Writer of [phone]

## 2021-04-17 NOTE — Progress Notes (Signed)
Pt transported on BiPAP from ED to 2M06 without any complications. RN at bedside, vitals are stable RT will continue to monitor.

## 2021-04-17 NOTE — ED Notes (Signed)
Attempted to give report 

## 2021-04-17 NOTE — ED Triage Notes (Signed)
Pt arrived via [facility] with c/c of difficulty breathing for 4-5 days. Pt hx of COPD, tried using inhalers with no reliefs. Pt responsive and talking. Pt lives at home with wife.   40%--> NRB 70% --> CPAP. 94HR 125 solumed

## 2021-04-17 NOTE — ED Provider Notes (Signed)
[place] EMERGENCY DEPARTMENT Provider Note   CSN: [id number] Arrival date & time: 04/17/21  0825  LEVEL 5 CAVEAT - RESPIRATORY DISTRESS  History Chief Complaint  Patient presents with   Respiratory Distress    Pt arrived via [facility] with c/c of difficulty breathing for 4-5 days. Pt hx of COPD, tried using inhalers with no reliefs. Pt responsive and talking. Pt lives at home with wife.   40%--> NRB 70% --> CPAP. 94HR 125 solumed     Daniel King is a 75 y.o. male.  HPI 75 year old male presents with respiratory distress.  History is limited as the patient is currently on CPAP and is having trouble breathing.  History is mostly from EMS.  About a week or so of shortness of breath and has a history of COPD.  No known chronic oxygen use.  He was found to have O2 sats in the 40s with difficulty breathing.  He was placed on nonrebreather but only came up into the 70s.  He was given neb treatments as well as put on CPAP and now his sats are 100%.  Given 125 mg Solumedrol. Mental status has stayed the same.  Further history is pretty limited from the patient.  Past Medical History:  Diagnosis Date   AAA (abdominal aortic aneurysm) ([place])    Bladder cancer ([place])    prostate   COPD (chronic obstructive pulmonary disease) ([place])    Hypertension     Patient Active Problem List   Diagnosis Date Noted   COPD exacerbation ([place]) 04/17/2021   Respiratory failure ([place]) 04/17/2021   Pain in left knee 08/17/2019    Past Surgical History:  Procedure Laterality Date   ABDOMINAL AORTIC ANEURYSM REPAIR     PROSTATECTOMY         No family history on file.  Social History   Tobacco Use   Smoking status: Every Day    Packs/day: 1.50    Years: 52.00    Pack years: 78.00    Types: Cigarettes   Smokeless tobacco: Never  Vaping Use   Vaping Use: Never used  Substance Use Topics   Alcohol use: Yes    Alcohol/week: 21.0 standard drinks    Types: 21 Shots of  liquor per week   Drug use: No    Home Medications Prior to Admission medications   Medication Sig Start Date End Date Taking? Authorizing Provider  albuterol (PROVENTIL HFA;VENTOLIN HFA) 108 (90 BASE) MCG/ACT inhaler Inhale 2 puffs into the lungs every 6 (six) hours as needed. For shortness of breath.    [provider]  alendronate (FOSAMAX) 10 MG tablet Take by mouth.    [provider]  aspirin EC 81 MG tablet Take 81 mg by mouth. 11/09/16   [provider]  atorvastatin (LIPITOR) 10 MG tablet Take 10 mg by mouth. 11/08/16   [provider]  Calcium Carbonate-Vit D-Min (CALCIUM 1200 PO) Take by mouth.    [provider]  cholecalciferol (VITAMIN D) 1000 UNITS tablet Take 1,000 Units by mouth daily.    [provider]  cyanocobalamin 100 MCG tablet Take 100 mcg by mouth daily.    [provider]  docusate sodium (COLACE) 100 MG capsule Take by mouth.    [provider]  enzalutamide Gillermina Phy) 40 MG capsule Take by mouth.    [provider]  ibuprofen (ADVIL,MOTRIN) 200 MG tablet Take 400 mg by mouth every 8 (eight) hours as needed. For pain.  [provider]  lisinopril (PRINIVIL,ZESTRIL) 5 MG tablet Take 5 mg by mouth daily.    [provider]  metoprolol tartrate (LOPRESSOR) 25 MG tablet Take 12.5 mg by mouth. 11/08/16   [provider]  Multiple Vitamin (MULTIVITAMIN WITH MINERALS) TABS Take 1 tablet by mouth daily.    [provider]  oxyCODONE-acetaminophen (PERCOCET/ROXICET) 5-325 MG per tablet Take 1-2 tablets by mouth every 6 (six) hours as needed for pain. Patient not taking: Reported on 08/17/2019 08/11/12   Carlisle Cater, PA-C  simvastatin (ZOCOR) 10 MG tablet Take 10 mg by mouth at bedtime.    [provider]  tiotropium (SPIRIVA) 18 MCG inhalation capsule Place 18 mcg into inhaler and inhale daily.    [provider]  Tiotropium Bromide-Olodaterol  (STIOLTO RESPIMAT) 2.5-2.5 MCG/ACT AERS Inhale into the lungs.    [provider]    Allergies    Patient has no known allergies.  Review of Systems   Review of Systems  Unable to perform ROS: Severe respiratory distress   Physical Exam Updated Vital Signs BP 125/73   Pulse 94   Temp (!) 97.4 F (36.3 C) (Axillary)   Resp (!) 21   Ht 6' (1.829 m)   Wt 63.5 kg   SpO2 100%   BMI 18.99 kg/m   Physical Exam Vitals and nursing note reviewed.  Constitutional:      Appearance: He is well-developed. He is ill-appearing.     Comments: Eyes are closed but he awakes to voice and light stimulation. Is able to follow commands and give short answers to questions  HENT:     Head: Normocephalic and atraumatic.     Right Ear: External ear normal.     Left Ear: External ear normal.     Nose: Nose normal.  Eyes:     General:        Right eye: No discharge.        Left eye: No discharge.  Cardiovascular:     Rate and Rhythm: Normal rate and regular rhythm.     Heart sounds: Normal heart sounds.  Pulmonary:     Effort: Tachypnea present.     Breath sounds: Decreased air movement present. Wheezing present.  Abdominal:     Palpations: Abdomen is soft.     Tenderness: There is no abdominal tenderness.  Musculoskeletal:     Cervical back: Neck supple.  Skin:    General: Skin is warm and dry.  Neurological:     Mental Status: He is alert.  Psychiatric:        Mood and Affect: Mood is not anxious.    ED Results / Procedures / Treatments   Labs (all labs ordered are listed, but only abnormal results are displayed) Labs Reviewed  COMPREHENSIVE METABOLIC PANEL - Abnormal; Notable for the following components:      Result Value   Sodium 126 (*)    Chloride 88 (*)    Glucose, Bld 152 (*)    Calcium 8.4 (*)    Albumin 2.7 (*)    AST 12 (*)    Total Bilirubin 0.2 (*)    All other components within normal limits  CBC WITH DIFFERENTIAL/PLATELET - Abnormal; Notable for the  following components:   WBC 11.6 (*)    RBC 4.17 (*)    Hemoglobin 11.6 (*)    HCT 38.1 (*)    RDW 17.3 (*)    Platelets 416 (*)    Neutro Abs 10.2 (*)  Lymphs Abs 0.5 (*)    Abs Immature Granulocytes 0.29 (*)    All other components within normal limits  BRAIN NATRIURETIC PEPTIDE - Abnormal; Notable for the following components:   B Natriuretic Peptide 336.3 (*)    All other components within normal limits  I-STAT ARTERIAL BLOOD GAS, ED - Abnormal; Notable for the following components:   pH, Arterial 7.244 (*)    pCO2 arterial 83.6 (*)    Bicarbonate 36.4 (*)    TCO2 39 (*)    Acid-Base Excess 6.0 (*)    Sodium 127 (*)    All other components within normal limits  CBG MONITORING, ED - Abnormal; Notable for the following components:   Glucose-Capillary 150 (*)    All other components within normal limits  I-STAT ARTERIAL BLOOD GAS, ED - Abnormal; Notable for the following components:   pH, Arterial 7.265 (*)    pCO2 arterial 66.2 (*)    Bicarbonate 30.3 (*)    Sodium 130 (*)    HCT 38.0 (*)    Hemoglobin 12.9 (*)    All other components within normal limits  I-STAT VENOUS BLOOD GAS, ED - Abnormal; Notable for the following components:   pH, Ven 7.241 (*)    pCO2, Ven 80.2 (*)    Bicarbonate 34.4 (*)    TCO2 37 (*)    Acid-Base Excess 4.0 (*)    Sodium 129 (*)    Calcium, Ion 1.05 (*)    All other components within normal limits  TROPONIN I (HIGH SENSITIVITY) - Abnormal; Notable for the following components:   Troponin I (High Sensitivity) 25 (*)    All other components within normal limits  RESP PANEL BY RT-PCR (FLU A&B, COVID) ARPGX2  CULTURE, BLOOD (ROUTINE X 2)  CULTURE, BLOOD (ROUTINE X 2)  URINE CULTURE  CULTURE, BLOOD (ROUTINE X 2)  CULTURE, BLOOD (ROUTINE X 2)  URINE CULTURE  LACTIC ACID, PLASMA  LACTIC ACID, PLASMA  PROTIME-INR  APTT  URINALYSIS, ROUTINE W REFLEX MICROSCOPIC  BLOOD GAS, VENOUS  RAPID URINE DRUG SCREEN, HOSP PERFORMED  COMPREHENSIVE  METABOLIC PANEL  MAGNESIUM  PHOSPHORUS  PROCALCITONIN  CORTISOL  CBC  PROTIME-INR  APTT  STREP PNEUMONIAE URINARY ANTIGEN  BLOOD GAS, ARTERIAL  LEGIONELLA PNEUMOPHILA SEROGP 1 UR AG  OSMOLALITY, URINE  OSMOLALITY  TROPONIN I (HIGH SENSITIVITY)    EKG EKG Interpretation  Date/Time:  Tuesday April 17 2021 12:45:19 EDT Ventricular Rate:  97 PR Interval:  122 QRS Duration: 110 QT Interval:  385 QTC Calculation: 490 R Axis:   149 Text Interpretation: Sinus rhythm Right axis deviation Abnormal R-wave progression, early transition Borderline prolonged QT interval similar to earlier in the day Confirmed by Sherwood Gambler [phone]) on 04/17/2021 12:49:30 PM  Radiology DG Chest Port 1 View  Result Date: 04/17/2021 CLINICAL DATA:  75 year old male with shortness of breath for 4-5 days. COPD. EXAM: PORTABLE CHEST 1 VIEW COMPARISON:  Chest radiographs 11/07/2016 and earlier. FINDINGS: Portable AP semi upright view at 0904 hours. Rotated to the right. Kyphotic head position obscuring the right lung apex. Chronic large lung volumes, lower compared to 2018. Patchy reticulonodular opacity at the right lung base. Mediastinal contours are within normal limits allowing for rotation. No acute osseous abnormality identified. IMPRESSION: Rotated portable exam with asymmetric reticulonodular opacity at the right lung base suspicious for acute infectious exacerbation superimposed on chronic lung disease. Electronically Signed   By: Genevie Ann M.D.   On: 04/17/2021 09:09    Procedures .Critical Care  Date/Time: 04/17/2021 1:18 PM Performed by: Sherwood Gambler, MD Authorized by: Sherwood Gambler, MD   Critical care provider statement:    Critical care time (minutes):  50   Critical care time was exclusive of:  Separately billable procedures and treating other patients   Critical care was necessary to treat or prevent imminent or life-threatening deterioration of the following conditions:  Respiratory  failure   Critical care was time spent personally by me on the following activities:  Ventilator management, development of treatment plan with patient or surrogate, discussions with consultants, evaluation of patient's response to treatment, examination of patient, obtaining history from patient or surrogate, ordering and performing treatments and interventions, ordering and review of laboratory studies, ordering and review of radiographic studies, pulse oximetry, re-evaluation of patient's condition and review of old charts   Medications Ordered in ED Medications  cefTRIAXone (ROCEPHIN) 2 g in sodium chloride 0.9 % 100 mL IVPB (0 g Intravenous Stopped 04/17/21 0926)  azithromycin (ZITHROMAX) 500 mg in sodium chloride 0.9 % 250 mL IVPB (0 mg Intravenous Stopped 04/17/21 1138)  docusate sodium (COLACE) capsule 100 mg (has no administration in time range)  polyethylene glycol (MIRALAX / GLYCOLAX) packet 17 g (has no administration in time range)  heparin injection 5,000 Units (has no administration in time range)  ondansetron (ZOFRAN) injection 4 mg (has no administration in time range)  pantoprazole (PROTONIX) injection 40 mg (has no administration in time range)  0.9 %  sodium chloride infusion (has no administration in time range)  methylPREDNISolone sodium succinate (SOLU-MEDROL) 125 mg/2 mL injection 60 mg (has no administration in time range)  arformoterol (BROVANA) nebulizer solution 15 mcg (has no administration in time range)  budesonide (PULMICORT) nebulizer solution 0.25 mg (has no administration in time range)  ipratropium-albuterol (DUONEB) 0.5-2.5 (3) MG/3ML nebulizer solution 3 mL (has no administration in time range)  sodium chloride 0.9 % bolus 1,000 mL (0 mLs Intravenous Stopped 04/17/21 1008)  albuterol (PROVENTIL,VENTOLIN) solution continuous neb (10 mg/hr Nebulization Given 04/17/21 0902)  albuterol (PROVENTIL) (2.5 MG/3ML) 0.083% nebulizer solution (  Given 04/17/21 0843)    ED  Course  I have reviewed the triage vital signs and the nursing notes.  Pertinent labs & imaging results that were available during my care of the patient were reviewed by me and considered in my medical decision making (see chart for details).    MDM Rules/Calculators/A&P                          Patient presents after being found severely hypoxic with difficult air movement.  He does seem better after albuterol and he was on BiPAP.  However despite positive pressure ventilation, his ABG has not improved.  He still wakes up to minimal stimulation.  He was treated for pneumonia.  Given no significant improvement in the CO2 value, ICU was consulted and they will admit him to the ICU and further titrate his BiPAP and closely monitor.  I think he can be monitored rather than intubated at this point. Final Clinical Impression(s) / ED Diagnoses Final diagnoses:  Acute on chronic respiratory failure with hypoxia and hypercapnia ([place])  Community acquired pneumonia of right lower lobe of lung    Rx / DC Orders ED Discharge Orders     None        Sherwood Gambler, MD 04/17/21 1516

## 2021-04-18 LAB — URINE CULTURE

## 2021-04-18 LAB — POCT I-STAT 7, (LYTES, BLD GAS, ICA,H+H)
Acid-Base Excess: 8 mmol/L — ABNORMAL HIGH (ref 0.0–2.0)
Bicarbonate: 36.1 mmol/L — ABNORMAL HIGH (ref 20.0–28.0)
Calcium, Ion: 1.16 mmol/L (ref 1.15–1.40)
HCT: 34 % — ABNORMAL LOW (ref 39.0–52.0)
Hemoglobin: 11.6 g/dL — ABNORMAL LOW (ref 13.0–17.0)
O2 Saturation: 95 %
Patient temperature: 98.3
Potassium: 4.4 mmol/L (ref 3.5–5.1)
Sodium: 131 mmol/L — ABNORMAL LOW (ref 135–145)
TCO2: 38 mmol/L — ABNORMAL HIGH (ref 22–32)
pCO2 arterial: 69.3 mmHg (ref 32.0–48.0)
pH, Arterial: 7.324 — ABNORMAL LOW (ref 7.350–7.450)
pO2, Arterial: 86 mmHg (ref 83.0–108.0)

## 2021-04-18 LAB — BASIC METABOLIC PANEL
Anion gap: 8 (ref 5–15)
Anion gap: 8 (ref 5–15)
BUN: 10 mg/dL (ref 8–23)
BUN: 11 mg/dL (ref 8–23)
CO2: 29 mmol/L (ref 22–32)
CO2: 31 mmol/L (ref 22–32)
Calcium: 8.2 mg/dL — ABNORMAL LOW (ref 8.9–10.3)
Calcium: 8.2 mg/dL — ABNORMAL LOW (ref 8.9–10.3)
Chloride: 95 mmol/L — ABNORMAL LOW (ref 98–111)
Chloride: 94 mmol/L — ABNORMAL LOW (ref 98–111)
Creatinine, Ser: 0.81 mg/dL (ref 0.61–1.24)
Creatinine, Ser: 0.94 mg/dL (ref 0.61–1.24)
GFR, Estimated: >60 mL/min (ref >60)
GFR, Estimated: >60 mL/min (ref >60)
Glucose, Bld: 135 mg/dL — ABNORMAL HIGH (ref 70–99)
Glucose, Bld: 109 mg/dL — ABNORMAL HIGH (ref 70–99)
Potassium: 4.4 mmol/L (ref 3.5–5.1)
Potassium: 4.7 mmol/L (ref 3.5–5.1)
Sodium: 132 mmol/L — ABNORMAL LOW (ref 135–145)
Sodium: 133 mmol/L — ABNORMAL LOW (ref 135–145)

## 2021-04-18 LAB — CBC
HCT: 32.9 % — ABNORMAL LOW (ref 39.0–52.0)
Hemoglobin: 10 g/dL — ABNORMAL LOW (ref 13.0–17.0)
MCH: 27.3 pg (ref 26.0–34.0)
MCHC: 30.4 g/dL (ref 30.0–36.0)
MCV: 89.9 fL (ref 80.0–100.0)
Platelets: 359 10*3/uL (ref 150–400)
RBC: 3.66 MIL/uL — ABNORMAL LOW (ref 4.22–5.81)
RDW: 17.3 % — ABNORMAL HIGH (ref 11.5–15.5)
WBC: 12.7 10*3/uL — ABNORMAL HIGH (ref 4.0–10.5)
nRBC: 0 % (ref 0.0–0.2)

## 2021-04-18 LAB — TROPONIN I (HIGH SENSITIVITY): Troponin I (High Sensitivity): 98 ng/L — ABNORMAL HIGH (ref <18)

## 2021-04-18 LAB — STREP PNEUMONIAE URINARY ANTIGEN: Strep Pneumo Urinary Antigen: NEGATIVE

## 2021-04-18 LAB — MAGNESIUM
Magnesium: 1.8 mg/dL (ref 1.7–2.4)
Magnesium: 2.2 mg/dL (ref 1.7–2.4)

## 2021-04-18 LAB — PHOSPHORUS: Phosphorus: 3.3 mg/dL (ref 2.5–4.6)

## 2021-04-18 MED ORDER — ASCORBIC ACID 500 MG PO TABS
500.0000 mg | ORAL_TABLET | Freq: Every day | ORAL | Status: DC
  Administered 2021-04-18 – 2021-04-27 (×10): 500 mg via ORAL
  Filled 2021-04-18 (×10): qty 1

## 2021-04-18 MED ORDER — VITAMIN D 25 MCG (1000 UNIT) PO TABS
2000.0000 [IU] | ORAL_TABLET | Freq: Every day | ORAL | Status: DC
  Administered 2021-04-18 – 2021-04-27 (×10): 2000 [IU] via ORAL
  Filled 2021-04-18 (×10): qty 2

## 2021-04-18 MED ORDER — PRESERVISION AREDS PO CAPS: ORAL_CAPSULE | Freq: Two times a day (BID) | ORAL | Status: DC

## 2021-04-18 MED ORDER — UMECLIDINIUM BROMIDE 62.5 MCG/INH IN AEPB
1.0000 | INHALATION_SPRAY | Freq: Every day | RESPIRATORY_TRACT | Status: DC
  Administered 2021-04-18 – 2021-04-19 (×2): 1 via RESPIRATORY_TRACT
  Filled 2021-04-18: qty 7

## 2021-04-18 MED ORDER — ALBUTEROL SULFATE (2.5 MG/3ML) 0.083% IN NEBU
2.0000 mg | INHALATION_SOLUTION | Freq: Four times a day (QID) | RESPIRATORY_TRACT | Status: DC | PRN
  Administered 2021-04-19: 2 mg via RESPIRATORY_TRACT
  Filled 2021-04-18: qty 3

## 2021-04-18 MED ORDER — ACETAMINOPHEN 325 MG PO TABS
650.0000 mg | ORAL_TABLET | ORAL | Status: DC | PRN
  Administered 2021-04-18 – 2021-04-22 (×5): 650 mg via ORAL
  Filled 2021-04-18 (×6): qty 2

## 2021-04-18 MED ORDER — AMIODARONE IV BOLUS ONLY 150 MG/100ML
150.0000 mg | Freq: Once | INTRAVENOUS | Status: AC
  Administered 2021-04-18: 150 mg via INTRAVENOUS
  Filled 2021-04-18: qty 100

## 2021-04-18 MED ORDER — DOCUSATE SODIUM 100 MG PO CAPS: 100.0000 mg | ORAL_CAPSULE | Freq: Two times a day (BID) | ORAL | Status: DC | PRN

## 2021-04-18 MED ORDER — ARFORMOTEROL TARTRATE 15 MCG/2ML IN NEBU
15.0000 ug | INHALATION_SOLUTION | Freq: Two times a day (BID) | RESPIRATORY_TRACT | Status: DC
  Administered 2021-04-18 – 2021-04-27 (×19): 15 ug via RESPIRATORY_TRACT
  Filled 2021-04-18 (×20): qty 2

## 2021-04-18 MED ORDER — AMLODIPINE BESYLATE 5 MG PO TABS: 5.0000 mg | ORAL_TABLET | Freq: Every day | ORAL | Status: DC

## 2021-04-18 MED ORDER — LISINOPRIL 10 MG PO TABS: 20.0000 mg | ORAL_TABLET | Freq: Every day | ORAL | Status: DC

## 2021-04-18 MED ORDER — MAGNESIUM SULFATE 2 GM/50ML IV SOLN
2.0000 g | Freq: Once | INTRAVENOUS | Status: AC
  Administered 2021-04-18: 2 g via INTRAVENOUS

## 2021-04-18 MED ORDER — AZITHROMYCIN 250 MG PO TABS
250.0000 mg | ORAL_TABLET | Freq: Every day | ORAL | Status: AC
  Administered 2021-04-18 – 2021-04-21 (×4): 250 mg via ORAL
  Filled 2021-04-18 (×4): qty 1

## 2021-04-18 MED ORDER — ATORVASTATIN CALCIUM 40 MG PO TABS
40.0000 mg | ORAL_TABLET | Freq: Every evening | ORAL | Status: DC
  Administered 2021-04-18 – 2021-04-26 (×9): 40 mg via ORAL
  Filled 2021-04-18 (×9): qty 1

## 2021-04-18 MED ORDER — PREDNISONE 20 MG PO TABS
40.0000 mg | ORAL_TABLET | Freq: Every day | ORAL | Status: DC
  Administered 2021-04-19 – 2021-04-24 (×6): 40 mg via ORAL
  Filled 2021-04-18 (×6): qty 2

## 2021-04-18 MED ORDER — ASPIRIN EC 81 MG PO TBEC
81.0000 mg | DELAYED_RELEASE_TABLET | Freq: Every day | ORAL | Status: DC
  Administered 2021-04-18 – 2021-04-19 (×2): 81 mg via ORAL
  Filled 2021-04-18 (×2): qty 1

## 2021-04-18 MED ORDER — METOPROLOL TARTRATE 12.5 MG HALF TABLET
12.5000 mg | ORAL_TABLET | Freq: Two times a day (BID) | ORAL | Status: DC
  Administered 2021-04-18 – 2021-04-27 (×19): 12.5 mg via ORAL
  Filled 2021-04-18 (×19): qty 1

## 2021-04-18 MED ORDER — CLOPIDOGREL BISULFATE 75 MG PO TABS
75.0000 mg | ORAL_TABLET | Freq: Every day | ORAL | Status: DC
  Administered 2021-04-18 – 2021-04-19 (×2): 75 mg via ORAL
  Filled 2021-04-18 (×2): qty 1

## 2021-04-18 MED ORDER — ENZALUTAMIDE 40 MG PO TABS
160.0000 mg | ORAL_TABLET | Freq: Every day | ORAL | Status: DC
  Administered 2021-04-18 – 2021-04-27 (×10): 160 mg via ORAL
  Filled 2021-04-18 (×12): qty 4

## 2021-04-18 NOTE — Plan of Care (Signed)
  Problem: Education: Goal: Knowledge of General Education information will improve Description: Including pain rating scale, medication(s)/side effects and non-pharmacologic comfort measures Outcome: Progressing   Problem: Health Behavior/Discharge Planning: Goal: Ability to manage health-related needs will improve Outcome: Progressing   Problem: Clinical Measurements: Goal: Ability to maintain clinical measurements within normal limits will improve Outcome: Progressing Goal: Will remain free from infection Outcome: Progressing Goal: Diagnostic test results will improve Outcome: Progressing Goal: Respiratory complications will improve Outcome: Progressing Goal: Cardiovascular complication will be avoided Outcome: Progressing   Problem: Activity: Goal: Risk for activity intolerance will decrease Outcome: Progressing   Problem: Nutrition: Goal: Adequate nutrition will be maintained Outcome: Progressing   Problem: Coping: Goal: Level of anxiety will decrease Outcome: Progressing   Problem: Elimination: Goal: Will not experience complications related to bowel motility Outcome: Progressing Goal: Will not experience complications related to urinary retention Outcome: Progressing   Problem: Pain Managment: Goal: General experience of comfort will improve Outcome: Progressing   Problem: Safety: Goal: Ability to remain free from injury will improve Outcome: Progressing   Problem: Skin Integrity: Goal: Risk for impaired skin integrity will decrease Outcome: Progressing   Problem: Education: Goal: Knowledge of disease or condition will improve Outcome: Progressing Goal: Knowledge of the prescribed therapeutic regimen will improve Outcome: Progressing Goal: Individualized Educational Video(s) Outcome: Progressing   Problem: Activity: Goal: Ability to tolerate increased activity will improve Outcome: Progressing Goal: Will verbalize the importance of balancing  activity with adequate rest periods Outcome: Progressing   Problem: Respiratory: Goal: Ability to maintain a clear airway will improve Outcome: Progressing Goal: Levels of oxygenation will improve Outcome: Progressing Goal: Ability to maintain adequate ventilation will improve Outcome: Progressing   Problem: Education: Goal: Knowledge of disease or condition will improve Outcome: Progressing Goal: Knowledge of the prescribed therapeutic regimen will improve Outcome: Progressing Goal: Individualized Educational Video(s) Outcome: Progressing   Problem: Activity: Goal: Ability to tolerate increased activity will improve Outcome: Progressing Goal: Will verbalize the importance of balancing activity with adequate rest periods Outcome: Progressing   Problem: Respiratory: Goal: Ability to maintain a clear airway will improve Outcome: Progressing Goal: Levels of oxygenation will improve Outcome: Progressing Goal: Ability to maintain adequate ventilation will improve Outcome: Progressing

## 2021-04-18 NOTE — Progress Notes (Signed)
Physical Therapy Evaluation Patient Details Name: Daniel King MRN: [id number] DOB: 1946/09/25 Today's Date: 04/18/2021   History of Present Illness  75 year old man admitted 6/14  presenting with worsening dyspnea.  EMS found him saturating in 71s.  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  PMH: COPD followed at [facility], prostate cancer, ETOH abuse  Clinical Impression  Pt admitted with above diagnosis. Pt needed mod assist for all aspects of mobility. Pt states that wife provided 24 hour care PTA and that he used wheelchair in the home. IF wife does provide 24 hour care, pt would be able to go home with HHPT.  Will follow acutely.  Pt currently with functional limitations due to the deficits listed below (see PT Problem List). Pt will benefit from skilled PT to increase their independence and safety with mobility to allow discharge to the venue listed below.       Follow Up Recommendations Home health PT;Supervision/Assistance - 24 hour (SNF if wife cant provide 24 hour care)    Equipment Recommendations  None recommended by PT    Recommendations for Other Services       Precautions / Restrictions Precautions Precautions: Fall Restrictions Weight Bearing Restrictions: No      Mobility  Bed Mobility Overal bed mobility: Needs Assistance Bed Mobility: Supine to Sit     Supine to sit: Min assist     General bed mobility comments: a little assist for LEs and for trunk    Transfers Overall transfer level: Needs assistance Equipment used: 2 person hand held assist Transfers: Sit to/from [employer] Transfers Sit to Stand: Mod assist Stand pivot transfers: Mod assist       General transfer comment: Pt needed steadying assist upon standing. Pt needed steadying assist to pivot safely to recliner as his right LE was uncoordinated and pt with knee instability right > left with pivot to the  recliner.  Ambulation/Gait             General Gait Details: NT- pt states he walked very little att home.  Stairs            Wheelchair Mobility    Modified Rankin (Stroke Patients Only)       Balance Overall balance assessment: Needs assistance Sitting-balance support: Feet supported;No upper extremity supported Sitting balance-Leahy Scale: Fair     Standing balance support: Bilateral upper extremity supported;During functional activity Standing balance-Leahy Scale: Poor Standing balance comment: relies on UE and external support for balance in static and dynamic activity                             Pertinent Vitals/Pain Pain Assessment: No/denies pain    Home Living Family/patient expects to be discharged to:: Private residence Living Arrangements: Spouse/significant other Available Help at Discharge: Family;Available 24 hours/day Type of Home: House Home Access: Stairs to enter Entrance Stairs-Rails: Right;Left;Can reach both Entrance Stairs-Number of Steps: 7 Home Layout: Multi-level;1/2 bath on main level Home Equipment: Wheelchair - [employer] - 2 wheels;Bedside commode;Adaptive equipment      Prior Function Level of Independence: Needs assistance   Gait / Transfers Assistance Needed: states he used wheelchair in house, used RW to get out of house and to doctors  ADL's / Benjamin Perez Needed: sponged bathed with wifes assist  Comments: Pt sleeps on sofa, used 1/2 bath to toilet  Hand Dominance   Dominant Hand: Right    Extremity/Trunk Assessment   Upper Extremity Assessment Upper Extremity Assessment: Defer to OT evaluation    Lower Extremity Assessment Lower Extremity Assessment: RLE deficits/detail;LLE deficits/detail RLE Deficits / Details: grossly 3-/5 LLE Deficits / Details: grossly 3-/5    Cervical / Trunk Assessment Cervical / Trunk Assessment: Kyphotic  Communication      Cognition  Arousal/Alertness: Awake/alert Behavior During Therapy: Flat affect Overall Cognitive Status: Within Functional Limits for tasks assessed                                        General Comments General comments (skin integrity, edema, etc.): 109 bpm, 95% on 2L, desat to 84% with transfer and returned to >90% wiht 5 min rest.    Exercises     Assessment/Plan    PT Assessment Patient needs continued PT services  PT Problem List Decreased activity tolerance;Decreased balance;Decreased mobility;Decreased knowledge of use of DME;Decreased safety awareness;Decreased knowledge of precautions       PT Treatment Interventions DME instruction;Gait training;Functional mobility training;Therapeutic activities;Therapeutic exercise;Balance training;Patient/family education;Stair training    PT Goals (Current goals can be found in the Care Plan section)  Acute Rehab PT Goals Patient Stated Goal: to go home PT Goal Formulation: With patient Time For Goal Achievement: 05/02/21 Potential to Achieve Goals: Good    Frequency Min 3X/week   Barriers to discharge        Co-evaluation               AM-PAC PT "6 Clicks" Mobility  Outcome Measure Help needed turning from your back to your side while in a flat bed without using bedrails?: A Little Help needed moving from lying on your back to sitting on the side of a flat bed without using bedrails?: A Little Help needed moving to and from a bed to a chair (including a wheelchair)?: A Lot Help needed standing up from a chair using your arms (e.g., wheelchair or bedside chair)?: A Lot Help needed to walk in hospital room?: Total Help needed climbing 3-5 steps with a railing? : Total 6 Click Score: 12    End of Session Equipment Utilized During Treatment: Gait belt;Oxygen Activity Tolerance: Patient limited by fatigue Patient left: in chair;with call bell/phone within reach;with chair alarm set Nurse Communication: Mobility  status PT Visit Diagnosis: Muscle weakness (generalized) (M62.81)    Time: 2774-1287 PT Time Calculation (min) (ACUTE ONLY): 19 min   Charges:   PT Evaluation $PT Eval Moderate Complexity: 1 Mod          Liv Rallis M,PT Acute Rehab Services [phone] (pager)   Alvira Philips 04/18/2021, 1:34 PM

## 2021-04-18 NOTE — Progress Notes (Signed)
04/18/2021  I have seen and evaluated the patient for COPD flare.  S:  Doing much better. Awake, ready to eat, wants to go home.  O: Blood pressure (!) 106/56, pulse (!) 103, temperature 98.3 F (36.8 C), temperature source Axillary, resp. rate 18, height 6' (1.829 m), weight 65.2 kg, SpO2 95 %.  Disheveled man in NAD Lungs with wheezing Ext without edema Weak but nonfocal exam Oriented, poor insight  Sodium improved Pct neg Urine Ag and osms not sent  A:  - AECOPD receives most of care at [facility] improving - Hx HTN, Bps here a little light - On DAPT ?hx of CVA - Hx light EtOH use - Hyponatremia in context of critical illness self correcting  P:  - Switch prednisone to PO - LABA/LAMA with PRN SABA - DC abx except azithromycin x 4 days - PT/OT, home O2 eval - Restart home metoprolol (hold amlodipine and lisinopril, consider resuming as OP, defer to PCP). - Will arrange close OP f/u in pulmonary clinic - Will need LDCT lung cancer screening - Will leave on PCCM service as may go home tomorrow  Erskine Emery MD [facility] epic messenger for cross cover needs If after hours, please call E-link

## 2021-04-19 ENCOUNTER — Encounter (HOSPITAL_COMMUNITY): Payer: Self-pay | Admitting: Internal Medicine

## 2021-04-19 ENCOUNTER — Inpatient Hospital Stay (HOSPITAL_COMMUNITY): Payer: No Typology Code available for payment source

## 2021-04-19 ENCOUNTER — Other Ambulatory Visit (HOSPITAL_COMMUNITY): Payer: Self-pay

## 2021-04-19 DIAGNOSIS — R410 Disorientation, unspecified: Secondary | ICD-10-CM

## 2021-04-19 DIAGNOSIS — J9602 Acute respiratory failure with hypercapnia: Secondary | ICD-10-CM | POA: Diagnosis not present

## 2021-04-19 DIAGNOSIS — J441 Chronic obstructive pulmonary disease with (acute) exacerbation: Secondary | ICD-10-CM | POA: Diagnosis not present

## 2021-04-19 DIAGNOSIS — I480 Paroxysmal atrial fibrillation: Secondary | ICD-10-CM

## 2021-04-19 DIAGNOSIS — J9601 Acute respiratory failure with hypoxia: Secondary | ICD-10-CM | POA: Diagnosis not present

## 2021-04-19 LAB — BLOOD GAS, ARTERIAL
Bicarbonate: 36.7 mmol/L — ABNORMAL HIGH (ref 20.0–28.0)
Drawn by: 55062
FIO2: 32
Patient temperature: 37
pCO2 arterial: 78.9 mmHg (ref 32.0–48.0)
pH, Arterial: 7.29 — ABNORMAL LOW (ref 7.350–7.450)
pO2, Arterial: 96.1 mmHg (ref 83.0–108.0)

## 2021-04-19 LAB — BASIC METABOLIC PANEL
Anion gap: 6 (ref 5–15)
BUN: 13 mg/dL (ref 8–23)
CO2: 35 mmol/L — ABNORMAL HIGH (ref 22–32)
Calcium: 8.1 mg/dL — ABNORMAL LOW (ref 8.9–10.3)
Chloride: 93 mmol/L — ABNORMAL LOW (ref 98–111)
Creatinine, Ser: 0.96 mg/dL (ref 0.61–1.24)
GFR, Estimated: >60 mL/min (ref >60)
Glucose, Bld: 102 mg/dL — ABNORMAL HIGH (ref 70–99)
Potassium: 4.8 mmol/L (ref 3.5–5.1)
Sodium: 134 mmol/L — ABNORMAL LOW (ref 135–145)

## 2021-04-19 LAB — MAGNESIUM: Magnesium: 2.1 mg/dL (ref 1.7–2.4)

## 2021-04-19 LAB — ECHOCARDIOGRAM COMPLETE
Area-P 1/2: 3.66 cm2
Height: 72 in
S' Lateral: 3 cm
Weight: 2402.13 oz

## 2021-04-19 MED ORDER — ENOXAPARIN SODIUM 80 MG/0.8ML IJ SOSY
70.0000 mg | PREFILLED_SYRINGE | Freq: Two times a day (BID) | INTRAMUSCULAR | Status: DC
  Administered 2021-04-19 – 2021-04-20 (×3): 70 mg via SUBCUTANEOUS
  Filled 2021-04-19 (×3): qty 0.8

## 2021-04-19 MED ORDER — IPRATROPIUM-ALBUTEROL 0.5-2.5 (3) MG/3ML IN SOLN
3.0000 mL | RESPIRATORY_TRACT | Status: DC | PRN
  Administered 2021-04-19 – 2021-04-27 (×5): 3 mL via RESPIRATORY_TRACT
  Filled 2021-04-19 (×5): qty 3

## 2021-04-19 MED ORDER — METOPROLOL TARTRATE 5 MG/5ML IV SOLN: 5.0000 mg | INTRAVENOUS | Status: DC | PRN

## 2021-04-19 MED ORDER — REVEFENACIN 175 MCG/3ML IN SOLN
175.0000 ug | Freq: Every day | RESPIRATORY_TRACT | Status: DC
  Administered 2021-04-20 – 2021-04-27 (×8): 175 ug via RESPIRATORY_TRACT
  Filled 2021-04-19 (×9): qty 3

## 2021-04-19 NOTE — TOC Benefit Eligibility Note (Signed)
Transition of Care [facility]) Benefit Eligibility Note    Patient Details  Name: Daniel King MRN: [id number] Date of Birth: 1945-12-05   Medication/Dose: EDOXABAN   and   DABIGATRON        Prescription Coverage Preferred Pharmacy: [facility]              Additional Notes: NO PHARMACY  BENEFITS ON FILE !    Memory Argue Phone Number: 04/19/2021, 4:46 PM

## 2021-04-19 NOTE — Progress Notes (Signed)
PROGRESS NOTE    Daniel King  SJG:[id number] DOB: 10-04-1946 DOA: 04/17/2021 PCP: Henreitta Cea, MD    Brief Narrative:  75yo with hx copd, prostate caner, CVA within the past year, hx ETOH abuse who presented to the ED with worsening sob despite inhalers, ultimately admitted to ICU service, requiring BiPAP, now transferred to [place]:   Active Problems:   COPD exacerbation (HCC)   Respiratory failure (HCC)   Pressure injury of skin  COPD PCCM following Cont on neb tx and steroids Per PCCM, recommend desaturation study prior to d/c with goal to wean off O2 Afib RVR, new diagnosis Overnight EKG reviewed personally, evidence of afib RVR Currently rate controlled Continued on scheduled metoprolol Currently on therapeutically dosed lovenox Discussed with Pharmacy, consider Edoxaban, in light of potential drug-drug interactions with Xtandi Hx CVA Per family, was diagnosed with CVA around 8/21 Had been on ASA and plavix Given new afib, have transitioned to full therapeutic anticoagulation after discussion with family, who agrees Recommend close f/u with Neurology when discharged Hx prostate cancer Seems stable at this time Acute toxic metabolic encephalopathy Tired appearing this AM Cont to treat copd above Hx ETOH abuse On librium currently Most recent CIWA noted to be 0 Hyponatremia Improving Recheck bmet in AM Acquired thrombophilia Secondary to above afib Cont anticlagulation above  DVT prophylaxis: Lovenox Code Status: Full Family Communication: Pt in room, family at bedside  Status is: Inpatient  Remains inpatient appropriate because:Inpatient level of care appropriate due to severity of illness  Dispo: The patient is from: Home              Anticipated d/c is to: Home              Patient currently is not medically stable to d/c.   Difficult to place patient No       Consultants:  PCCM  Procedures:     Antimicrobials: Anti-infectives (From admission, onward)    Start     Dose/Rate Route Frequency Ordered Stop   04/18/21 1000  azithromycin (ZITHROMAX) tablet 250 mg        250 mg Oral Daily 04/18/21 0725 04/22/21 0959   04/17/21 0845  cefTRIAXone (ROCEPHIN) 2 g in sodium chloride 0.9 % 100 mL IVPB  Status:  Discontinued        2 g 200 mL/hr over 30 Minutes Intravenous Every 24 hours 04/17/21 0834 04/18/21 0725   04/17/21 0845  azithromycin (ZITHROMAX) 500 mg in sodium chloride 0.9 % 250 mL IVPB  Status:  Discontinued        500 mg 250 mL/hr over 60 Minutes Intravenous Every 24 hours 04/17/21 0834 04/18/21 0725       Subjective: Difficult to assess. Pt drowsy this AM falls asleep  Objective: Vitals:   04/19/21 0759 04/19/21 1315 04/19/21 1407 04/19/21 1411  BP:    136/64  Pulse: 82   85  Resp: 18   16  Temp:    98.3 F (36.8 C)  TempSrc:    Oral  SpO2: 97% 90% 100% 100%  Weight:      Height:        Intake/Output Summary (Last 24 hours) at 04/19/2021 1459 Last data filed at 04/19/2021 0550 Gross per 24 hour  Intake 960 ml  Output 1050 ml  Net -90 ml   Filed Weights   04/17/21 0831 04/18/21 0500 04/19/21 0541  Weight: 63.5 kg 65.2 kg 68.1 kg    Examination:  General exam: Drowsy, arousab, laying in bed, in nad Respiratory system: Increased resp effort, decreased BS Cardiovascular system: regular rate, s1, s2 Gastrointestinal system: Soft, nondistended, positive BS Central nervous system: CN2-12 grossly intact, strength intact Extremities: Perfused, no clubbing Skin: Normal skin turgor, no notable skin lesions seen Psychiatry: Unable to assess given mentation  Data Reviewed: I have personally reviewed following labs and imaging studies  CBC: Recent Labs  Lab 04/17/21 0844 04/17/21 0848 04/17/21 1109 04/17/21 1311 04/17/21 1613 04/18/21 0239 04/18/21 0414  WBC 11.6*  --   --   --  7.1 12.7*  --   NEUTROABS 10.2*  --   --   --   --   --   --   HGB  11.6*   < > 13.3 12.9* 10.6* 10.0* 11.6*  HCT 38.1*   < > 39.0 38.0* 35.2* 32.9* 34.0*  MCV 91.4  --   --   --  90.7 89.9  --   PLT 416*  --   --   --  364 359  --    < > = values in this interval not displayed.   Basic Metabolic Panel: Recent Labs  Lab 04/17/21 0844 04/17/21 0848 04/17/21 1613 04/18/21 0239 04/18/21 0414 04/18/21 2013 04/19/21 0310  NA 126*   < > 130* 132* 131* 133* 134*  K 4.6   < > 4.3 4.4 4.4 4.7 4.8  CL 88*  --  88* 95*  --  94* 93*  CO2 30  --  32 29  --  31 35*  GLUCOSE 152*  --  119* 135*  --  109* 102*  BUN 9  --  8 10  --  11 13  CREATININE 0.77  --  0.81 0.81  --  0.94 0.96  CALCIUM 8.4*  --  8.1* 8.2*  --  8.2* 8.1*  MG  --   --  1.7 1.8  --  2.2 2.1  PHOS  --   --  3.8 3.3  --   --   --    < > = values in this interval not displayed.   GFR: Estimated Creatinine Clearance: 64 mL/min (by C-G formula based on SCr of 0.96 mg/dL). Liver Function Tests: Recent Labs  Lab 04/17/21 0844 04/17/21 1613  AST 12* 14*  ALT 8 8  ALKPHOS 104 97  BILITOT 0.2* 0.4  PROT 6.5 5.7*  ALBUMIN 2.7* 2.4*   No results for input(s): LIPASE, AMYLASE in the last 168 hours. No results for input(s): AMMONIA in the last 168 hours. Coagulation Profile: Recent Labs  Lab 04/17/21 0844 04/17/21 1613  INR 1.0 1.0   Cardiac Enzymes: No results for input(s): CKTOTAL, CKMB, CKMBINDEX, TROPONINI in the last 168 hours. BNP (last 3 results) No results for input(s): PROBNP in the last 8760 hours. HbA1C: No results for input(s): HGBA1C in the last 72 hours. CBG: Recent Labs  Lab 04/17/21 0840  GLUCAP 150*   Lipid Profile: No results for input(s): CHOL, HDL, LDLCALC, TRIG, CHOLHDL, LDLDIRECT in the last 72 hours. Thyroid Function Tests: No results for input(s): TSH, T4TOTAL, FREET4, T3FREE, THYROIDAB in the last 72 hours. Anemia Panel: No results for input(s): VITAMINB12, FOLATE, FERRITIN, TIBC, IRON, RETICCTPCT in the last 72 hours. Sepsis Labs: Recent Labs   Lab 04/17/21 0845 04/17/21 1049 04/17/21 1613  PROCALCITON  --   --  <0.10  LATICACIDVEN 1.8 1.6  --     Recent Results (from the past 240 hour(s))  Blood  Culture (routine x 2)     Status: None (Preliminary result)   Collection Time: 04/17/21  8:30 AM   Specimen: BLOOD RIGHT ARM  Result Value Ref Range Status   Specimen Description BLOOD RIGHT ARM  Final   Special Requests   Final    BOTTLES DRAWN AEROBIC AND ANAEROBIC Blood Culture adequate volume   Culture   Final    NO GROWTH 2 DAYS Performed at [facility], [address], [place], [place] [address]    Report Status PENDING  Incomplete  Resp Panel by RT-PCR (Flu A&B, Covid) Nasopharyngeal Swab     Status: None   Collection Time: 04/17/21  8:34 AM   Specimen: Nasopharyngeal Swab; Nasopharyngeal(NP) swabs in vial transport medium  Result Value Ref Range Status   SARS Coronavirus 2 by RT PCR NEGATIVE NEGATIVE Final    Comment: (NOTE) SARS-CoV-2 target nucleic acids are NOT DETECTED.  The SARS-CoV-2 RNA is generally detectable in upper respiratory specimens during the acute phase of infection. The lowest concentration of SARS-CoV-2 viral copies this assay can detect is 138 copies/mL. A negative result does not preclude SARS-Cov-2 infection and should not be used as the sole basis for treatment or other patient management decisions. A negative result may occur with  improper specimen collection/handling, submission of specimen other than nasopharyngeal swab, presence of viral mutation(s) within the areas targeted by this assay, and inadequate number of viral copies(<138 copies/mL). A negative result must be combined with clinical observations, patient history, and epidemiological information. The expected result is Negative.  Fact Sheet for Patients:  EntrepreneurPulse.com.au  Fact Sheet for Healthcare Providers:  IncredibleEmployment.be  This test is no t yet approved or  cleared by the [place] FDA and  has been authorized for detection and/or diagnosis of SARS-CoV-2 by FDA under an Emergency Use Authorization (EUA). This EUA will remain  in effect (meaning this test can be used) for the duration of the COVID-19 declaration under Section 564(b)(1) of the Act, 21 U.S.C.section 360bbb-3(b)(1), unless the authorization is terminated  or revoked sooner.       Influenza A by PCR NEGATIVE NEGATIVE Final   Influenza B by PCR NEGATIVE NEGATIVE Final    Comment: (NOTE) The Xpert Xpress SARS-CoV-2/FLU/RSV plus assay is intended as an aid in the diagnosis of influenza from Nasopharyngeal swab specimens and should not be used as a sole basis for treatment. Nasal washings and aspirates are unacceptable for Xpert Xpress SARS-CoV-2/FLU/RSV testing.  Fact Sheet for Patients: EntrepreneurPulse.com.au  Fact Sheet for Healthcare Providers: IncredibleEmployment.be  This test is not yet approved or cleared by the [place] FDA and has been authorized for detection and/or diagnosis of SARS-CoV-2 by FDA under an Emergency Use Authorization (EUA). This EUA will remain in effect (meaning this test can be used) for the duration of the COVID-19 declaration under Section 564(b)(1) of the Act, 21 U.S.C. section 360bbb-3(b)(1), unless the authorization is terminated or revoked.  Performed at [facility], [place] [address] [place], [place] [address]   Blood Culture (routine x 2)     Status: None (Preliminary result)   Collection Time: 04/17/21  8:46 AM   Specimen: BLOOD LEFT ARM  Result Value Ref Range Status   Specimen Description BLOOD LEFT ARM  Final   Special Requests   Final    BOTTLES DRAWN AEROBIC AND ANAEROBIC Blood Culture results may not be optimal due to an inadequate volume of blood received in culture bottles   Culture  Final    NO GROWTH 2 DAYS Performed at [facility], [place] [address],  [place], [place] [address]    Report Status PENDING  Incomplete  MRSA Next Gen by PCR, Nasal     Status: None   Collection Time: 04/17/21  3:13 PM  Result Value Ref Range Status   MRSA by PCR Next Gen NOT DETECTED NOT DETECTED Final    Comment: (NOTE) The GeneXpert MRSA Assay (FDA approved for NASAL specimens only), is one component of a comprehensive MRSA colonization surveillance program. It is not intended to diagnose MRSA infection nor to guide or monitor treatment for MRSA infections. Test performance is not FDA approved in patients less than 48 years old. Performed at [facility], [place] [address], [place], [place] [address]   Culture, blood (routine x 2)     Status: None (Preliminary result)   Collection Time: 04/17/21  4:05 PM   Specimen: BLOOD  Result Value Ref Range Status   Specimen Description BLOOD LEFT ANTECUBITAL  Final   Special Requests   Final    BOTTLES DRAWN AEROBIC ONLY Blood Culture results may not be optimal due to an inadequate volume of blood received in culture bottles   Culture   Final    NO GROWTH 2 DAYS Performed at [facility], [place] [address], [place], [place] [address]    Report Status PENDING  Incomplete  Culture, blood (routine x 2)     Status: None (Preliminary result)   Collection Time: 04/17/21  4:15 PM   Specimen: BLOOD RIGHT HAND  Result Value Ref Range Status   Specimen Description BLOOD RIGHT HAND  Final   Special Requests   Final    BOTTLES DRAWN AEROBIC ONLY Blood Culture adequate volume   Culture   Final    NO GROWTH 2 DAYS Performed at [facility], [place] [address], [place], [place] [address]    Report Status PENDING  Incomplete  Urine culture     Status: Abnormal   Collection Time: 04/17/21  6:33 PM   Specimen: In/Out Cath Urine  Result Value Ref Range Status   Specimen Description IN/OUT CATH URINE  Final   Special Requests   Final    NONE Performed at [facility], [place] [address] [place], [place]  [address]    Culture MULTIPLE SPECIES PRESENT, SUGGEST RECOLLECTION (A)  Final   Report Status 04/18/2021 FINAL  Final     Radiology Studies: ECHOCARDIOGRAM COMPLETE  Result Date: 04/19/2021    ECHOCARDIOGRAM REPORT   Patient Name:   HAMLIN DEVINE II Date of Exam: 04/19/2021 Medical Rec #:  [id number]           Height:       72.0 in Accession #:    [id number]          Weight:       150.1 lb Date of Birth:  1946/03/18           BSA:          1.886 m Patient Age:    29 years            BP:           129/69 mmHg Patient Gender: M                   HR:           74 bpm. Exam Location:  Inpatient Procedure: 2D  Echo, Cardiac Doppler and Color Doppler Indications:    I48.0 Paroxysmal atrial fibrillation  History:        Patient has no prior history of Echocardiogram examinations.                 COPD; Risk Factors:Hypertension. Abdominal Aortic Aneurysm.  Sonographer:    Jonelle Sidle Dance Referring Phys: 32 Evely Gainey K Randilyn Foisy  Sonographer Comments: Suboptimal apical window and suboptimal parasternal window. IMPRESSIONS  1. Left ventricular ejection fraction, by estimation, is 65 to 70%. The left ventricle has normal function. The left ventricle has no regional wall motion abnormalities. Left ventricular diastolic parameters were normal.  2. Right ventricular systolic function is normal. The right ventricular size is normal. Tricuspid regurgitation signal is inadequate for assessing PA pressure.  3. The mitral valve is normal in structure. No evidence of mitral valve regurgitation. No evidence of mitral stenosis.  4. The aortic valve is tricuspid. Aortic valve regurgitation is not visualized. Mild to moderate aortic valve sclerosis/calcification is present, without any evidence of aortic stenosis.  5. The inferior vena cava is normal in size with greater than 50% respiratory variability, suggesting right atrial pressure of 3 mmHg. FINDINGS  Left Ventricle: Left ventricular ejection fraction, by estimation, is 65 to 70%. The  left ventricle has normal function. The left ventricle has no regional wall motion abnormalities. The left ventricular internal cavity size was normal in size. There is  no left ventricular hypertrophy. Left ventricular diastolic parameters were normal. Normal left ventricular filling pressure. Right Ventricle: The right ventricular size is normal. No increase in right ventricular wall thickness. Right ventricular systolic function is normal. Tricuspid regurgitation signal is inadequate for assessing PA pressure. Left Atrium: Left atrial size was normal in size. Right Atrium: Right atrial size was normal in size. Pericardium: There is no evidence of pericardial effusion. Mitral Valve: The mitral valve is normal in structure. No evidence of mitral valve regurgitation. No evidence of mitral valve stenosis. Tricuspid Valve: The tricuspid valve is normal in structure. Tricuspid valve regurgitation is trivial. No evidence of tricuspid stenosis. Aortic Valve: The aortic valve is tricuspid. Aortic valve regurgitation is not visualized. Mild to moderate aortic valve sclerosis/calcification is present, without any evidence of aortic stenosis. Pulmonic Valve: The pulmonic valve was normal in structure. Pulmonic valve regurgitation is not visualized. No evidence of pulmonic stenosis. Aorta: The aortic root is normal in size and structure. Venous: The inferior vena cava is normal in size with greater than 50% respiratory variability, suggesting right atrial pressure of 3 mmHg. IAS/Shunts: The interatrial septum appears to be lipomatous. No atrial level shunt detected by color flow Doppler.  LEFT VENTRICLE PLAX 2D LVIDd:         4.30 cm  Diastology LVIDs:         3.00 cm  LV e' medial:    9.03 cm/s LV PW:         1.30 cm  LV E/e' medial:  9.4 LV IVS:        1.00 cm  LV e' lateral:   11.30 cm/s LVOT diam:     1.90 cm  LV E/e' lateral: 7.5 LV SV:         58 LV SV Index:   31 LVOT Area:     2.84 cm  RIGHT VENTRICLE             IVC  RV Basal diam:  3.10 cm     IVC diam: 1.80 cm RV Mid diam:  2.20 cm RV S prime:     12.50 cm/s TAPSE (M-mode): 2.2 cm LEFT ATRIUM             Index       RIGHT ATRIUM           Index LA diam:        3.40 cm 1.80 cm/m  RA Area:     15.70 cm LA Vol (A2C):   66.9 ml 35.47 ml/m RA Volume:   39.80 ml  21.10 ml/m LA Vol (A4C):   50.3 ml 26.67 ml/m LA Biplane Vol: 57.5 ml 30.49 ml/m  AORTIC VALVE LVOT Vmax:   73.80 cm/s LVOT Vmean:  49.800 cm/s LVOT VTI:    0.206 m  AORTA Ao Root diam: 3.60 cm MITRAL VALVE MV Area (PHT): 3.66 cm    SHUNTS MV Decel Time: 207 msec    Systemic VTI:  0.21 m MV E velocity: 84.80 cm/s  Systemic Diam: 1.90 cm MV A velocity: 66.80 cm/s MV E/A ratio:  1.27 Fransico Him MD Electronically signed by Fransico Him MD Signature Date/Time: 04/19/2021/11:56:09 AM    Final     Scheduled Meds:  arformoterol  15 mcg Nebulization BID   ascorbic acid  500 mg Oral Daily   atorvastatin  40 mg Oral QPM   azithromycin  250 mg Oral Daily   Chlorhexidine Gluconate Cloth  6 each Topical Daily   cholecalciferol  2,000 Units Oral Daily   enoxaparin (LOVENOX) injection  70 mg Subcutaneous BID   enzalutamide  160 mg Oral Daily   metoprolol tartrate  12.5 mg Oral BID   pantoprazole (PROTONIX) IV  40 mg Intravenous QHS   predniSONE  40 mg Oral Q breakfast   revefenacin  175 mcg Nebulization Daily   Continuous Infusions:  sodium chloride Stopped (04/17/21 1351)     LOS: 2 days   Marylu Lund, MD [facility] Pager On Amion  If 7PM-7AM, please contact night-coverage 04/19/2021, 2:59 PM

## 2021-04-19 NOTE — Plan of Care (Signed)
  Problem: Education: Goal: Knowledge of General Education information will improve Description: Including pain rating scale, medication(s)/side effects and non-pharmacologic comfort measures Outcome: Progressing   Problem: Health Behavior/Discharge Planning: Goal: Ability to manage health-related needs will improve Outcome: Progressing   Problem: Clinical Measurements: Goal: Ability to maintain clinical measurements within normal limits will improve Outcome: Progressing Goal: Will remain free from infection Outcome: Progressing Goal: Diagnostic test results will improve Outcome: Progressing Goal: Respiratory complications will improve Outcome: Progressing Goal: Cardiovascular complication will be avoided Outcome: Progressing   Problem: Activity: Goal: Risk for activity intolerance will decrease Outcome: Progressing   Problem: Nutrition: Goal: Adequate nutrition will be maintained Outcome: Progressing   Problem: Coping: Goal: Level of anxiety will decrease Outcome: Progressing   Problem: Elimination: Goal: Will not experience complications related to bowel motility Outcome: Progressing Goal: Will not experience complications related to urinary retention Outcome: Progressing   Problem: Pain Managment: Goal: General experience of comfort will improve Outcome: Progressing   Problem: Safety: Goal: Ability to remain free from injury will improve Outcome: Progressing   Problem: Skin Integrity: Goal: Risk for impaired skin integrity will decrease Outcome: Progressing   Problem: Education: Goal: Knowledge of disease or condition will improve Outcome: Progressing Goal: Knowledge of the prescribed therapeutic regimen will improve Outcome: Progressing Goal: Individualized Educational Video(s) Outcome: Progressing   Problem: Activity: Goal: Ability to tolerate increased activity will improve Outcome: Progressing Goal: Will verbalize the importance of balancing  activity with adequate rest periods Outcome: Progressing   Problem: Respiratory: Goal: Ability to maintain a clear airway will improve Outcome: Progressing Goal: Levels of oxygenation will improve Outcome: Progressing Goal: Ability to maintain adequate ventilation will improve Outcome: Progressing   Problem: Education: Goal: Knowledge of disease or condition will improve Outcome: Progressing Goal: Knowledge of the prescribed therapeutic regimen will improve Outcome: Progressing Goal: Individualized Educational Video(s) Outcome: Progressing   Problem: Activity: Goal: Ability to tolerate increased activity will improve Outcome: Progressing Goal: Will verbalize the importance of balancing activity with adequate rest periods Outcome: Progressing   Problem: Respiratory: Goal: Ability to maintain a clear airway will improve Outcome: Progressing Goal: Levels of oxygenation will improve Outcome: Progressing Goal: Ability to maintain adequate ventilation will improve Outcome: Progressing

## 2021-04-19 NOTE — Plan of Care (Signed)
  Problem: Pain Managment: Goal: General experience of comfort will improve Outcome: Progressing   Problem: Safety: Goal: Ability to remain free from injury will improve Outcome: Progressing   Problem: Skin Integrity: Goal: Risk for impaired skin integrity will decrease Outcome: Progressing   Problem: Activity: Goal: Risk for activity intolerance will decrease Outcome: Not Progressing   Problem: Nutrition: Goal: Adequate nutrition will be maintained Outcome: Not Progressing   Problem: Elimination: Goal: Will not experience complications related to bowel motility Outcome: Not Progressing

## 2021-04-19 NOTE — Progress Notes (Signed)
Date and time results received: 04/19/21 1935  Test: PCo2 arterial Critical Value: 78.9  Name of Provider Notified: Opyd, MD  Orders Received? Or Actions Taken?:  MD ordered Bipap Respiratory notified of new orders.

## 2021-04-19 NOTE — Evaluation (Signed)
Occupational Therapy Evaluation Patient Details Name: Daniel King MRN: [id number] DOB: Apr 01, 1946 Today's Date: 04/19/2021    History of Present Illness 75 year old man admitted 6/14  presenting with worsening dyspnea.  EMS found him saturating in 76s. Placed on PAP and brought to [facility] found to be in acute on chronic hypoxemic and hypercarbic respiratory failure.  PMH: COPD followed at [place], Hx of CVA 08/2020, prostate cancer, and ETOH abuse.   Clinical Impression   PTA patient was living with his wife and son in a private residence and was requiring assistance from his wife for bathing/dressing/toileting and ADL transfers via hand held assist. Patient and wife present at bedside report use of wheelchair in home and community dwellings since [place] in 08/2020. Patient currently functioning below baseline demonstrating observed ADLs including LB dressing and functional transfers with Min to Mod A grossly and increased time/effort. Patient also limited by deficits listed below including generalized weakness/debility, decreased activity tolerance requiring frequent rest breaks, and decreased cardiopulmonary endurance requiring 2L supplemental O2 via [place] (does not use O2 at baseline) and would benefit from continued acute OT services in prep for safe d/c home with [place] and continued 24hr supervision/assist from wife.     Follow Up Recommendations  Home health OT;Supervision/Assistance - 24 hour    Equipment Recommendations  3 in 1 bedside commode    Recommendations for Other Services       Precautions / Restrictions Precautions Precautions: Fall Restrictions Weight Bearing Restrictions: No      Mobility Bed Mobility Overal bed mobility: Needs Assistance Bed Mobility: Supine to Sit     Supine to sit: Supervision;Min assist     General bed mobility comments: Patient able to advance BLE toward EOB with increased time/effort and no external assist. HOB elevated >30 degrees. Able to bring  trunk upright with use of bedrail.    Transfers Overall transfer level: Needs assistance Equipment used: 1 person hand held assist Transfers: Sit to/from [facility] Sit to Stand: Min assist;Mod assist Stand pivot transfers: Min assist;Mod assist       General transfer comment: Min A for sit to stand from EOB positioned in lowest setting and Min A for stand-pivot to recliner. Patient declined further mobility.    Balance Overall balance assessment: Needs assistance Sitting-balance support: Feet supported;No upper extremity supported Sitting balance-Leahy Scale: Fair     Standing balance support: Bilateral upper extremity supported;During functional activity Standing balance-Leahy Scale: Poor Standing balance comment: relies on UE and external support for balance with static and dynamic activity                           ADL either performed or assessed with clinical judgement   ADL Overall ADL's : Needs assistance/impaired Eating/Feeding: Set up;Sitting                   Lower Body Dressing: Moderate assistance;Sit to/from stand Lower Body Dressing Details (indicate cue type and reason): Mod A to don footwear seated EOB. Toilet Transfer: Moderate assistance Toilet Transfer Details (indicate cue type and reason): Simulated with transfer to recliner via stand-pivot with HHA +1           General ADL Comments: Patient limited by generalized weakness/debility, poor activity tolerance and decreased cardiopulmonary endurance.     Vision Baseline Vision/History: Wears glasses Wears Glasses: At all times Patient Visual Report: No change from baseline       Perception  Praxis      Pertinent Vitals/Pain Pain Assessment: No/denies pain     Hand Dominance Right   Extremity/Trunk Assessment Upper Extremity Assessment Upper Extremity Assessment: RUE deficits/detail RUE Deficits / Details: AROM limited at shoulder. AROM at  elbow/wrist/digits [facility] although slow. MMT grossly 3+/5. RUE Sensation: WNL RUE Coordination: decreased fine motor   Lower Extremity Assessment Lower Extremity Assessment: Defer to PT evaluation   Cervical / Trunk Assessment Cervical / Trunk Assessment: Kyphotic   Communication Communication Communication: No difficulties   Cognition Arousal/Alertness: Awake/alert Behavior During Therapy: Flat affect Overall Cognitive Status: Impaired/Different from baseline Area of Impairment: Orientation                 Orientation Level: Situation             General Comments: Patient reports being admitted for CVA. Per wife present at bedside, patient had CVA 08/2020. Patient unable to recall reason for this admission.   General Comments  SpO2 97% on 2L O2 via  upon entry. 87% seated EOB. HR 80-90's throughout. SpO2 returned to high 90's at conclusion of session.    Exercises     Shoulder Instructions      Home Living Family/patient expects to be discharged to:: Private residence Living Arrangements: Spouse/significant other Available Help at Discharge: Family;Available 24 hours/day Type of Home: House Home Access: Stairs to enter [employer] of Steps: 7 Entrance Stairs-Rails: Right;Left;Can reach both Home Layout: Multi-level;1/2 bath on main level     Bathroom Shower/Tub: Other (comment) (unable to access)   Biochemist, clinical: Standard     Home Equipment: Wheelchair - [employer] - 2 wheels;Adaptive equipment;Toilet riser Adaptive Equipment: [place] Additional Comments: Wife interested in obtaining BSC.      Prior Functioning/Environment Level of Independence: Needs assistance  Gait / Transfers Assistance Needed: Wheelchair in home and community dwellings. Takes a few steps from wc to commode in bathroom with HHA from wife. ADL's / Homemaking Assistance Needed: On good days, patient able to wash UB and front perineal area/buttocks. On bad days wife  assists with all parts of bathing with exception of buttocks. Wife assist with LB dressing and IADLs including cooking/cleaning and driving.   Comments: Pt sleeps on sofa, used 1/2 bath to toilet        OT Problem List: Decreased strength;Decreased range of motion;Decreased activity tolerance;Impaired balance (sitting and/or standing);Decreased coordination;Decreased cognition;Decreased knowledge of use of DME or AE;Impaired UE functional use;Increased edema      OT Treatment/Interventions: Self-care/ADL training;Therapeutic exercise;Neuromuscular education;Energy conservation;Therapeutic activities;Patient/family education;Balance training    OT Goals(Current goals can be found in the care plan section) Acute Rehab OT Goals Patient Stated Goal: to go home OT Goal Formulation: With patient Time For Goal Achievement: 05/03/21 Potential to Achieve Goals: Good ADL Goals Additional ADL Goal #1: Patient will complete ADL transfers with Min A and AD PRN. Additional ADL Goal #2: Patient will recall 3 energy conservation techniques in prep for ADLs. Additional ADL Goal #3: Patient will tolerate 2 sets x10 reps each of BUE HEP to improve strength in prep for ADLs.  OT Frequency: Min 2X/week   Barriers to D/C:            Co-evaluation              AM-PAC OT "6 Clicks" Daily Activity     Outcome Measure Help from another person eating meals?: None Help from another person taking care of personal grooming?: A Little Help from another person toileting, which  includes using toliet, bedpan, or urinal?: A Lot Help from another person bathing (including washing, rinsing, drying)?: A Lot Help from another person to put on and taking off regular upper body clothing?: A Little Help from another person to put on and taking off regular lower body clothing?: A Lot 6 Click Score: 16   End of Session Equipment Utilized During Treatment: Gait belt;Oxygen Nurse Communication: Mobility  status  Activity Tolerance: Patient tolerated treatment well Patient left: in chair;with call bell/phone within reach  OT Visit Diagnosis: Unsteadiness on feet (R26.81);Muscle weakness (generalized) (M62.81);Hemiplegia and hemiparesis Hemiplegia - Right/Left: Right Hemiplegia - dominant/non-dominant: Dominant Hemiplegia - caused by: Unspecified                Time: 0803-0820 OT Time Calculation (min): 17 min Charges:  OT General Charges $OT Visit: 1 Visit OT Evaluation $OT Eval Moderate Complexity: 1 Mod  Eydie Wormley H. OTR/L Supplemental OT, Department of rehab services [phone]  Tylie Golonka R H. 04/19/2021, 8:36 AM

## 2021-04-19 NOTE — Progress Notes (Signed)
ANTICOAGULATION CONSULT NOTE - Initial Consult  Pharmacy Consult for Lovenox Indication: atrial fibrillation  No Known Allergies  Patient Measurements: Height: 6' (182.9 cm) Weight: 68.1 kg (150 lb 2.1 oz) IBW/kg (Calculated) : 77.6 Heparin Dosing Weight:   Vital Signs: Temp: 97.9 F (36.6 C) (06/16 0752) Temp Source: Oral (06/16 0752) BP: 129/69 (06/16 0752) Pulse Rate: 82 (06/16 0759)  Labs: Recent Labs    04/17/21 0844 04/17/21 0848 04/17/21 1103 04/17/21 1109 04/17/21 1613 04/18/21 0239 04/18/21 0414 04/18/21 2013 04/19/21 0310  HGB 11.6*   < >  --    < > 10.6* 10.0* 11.6*  --   --   HCT 38.1*   < >  --    < > 35.2* 32.9* 34.0*  --   --   PLT 416*  --   --   --  364 359  --   --   --   APTT 28  --   --   --  31  --   --   --   --   LABPROT 12.7  --   --   --  13.0  --   --   --   --   INR 1.0  --   --   --  1.0  --   --   --   --   CREATININE 0.77  --   --   --  0.81 0.81  --  0.94 0.96  TROPONINIHS 25*  --  54*  --   --   --   --  98*  --    < > = values in this interval not displayed.    Estimated Creatinine Clearance: 64 mL/min (by C-G formula based on SCr of 0.96 mg/dL).   Medical History: Past Medical History:  Diagnosis Date   AAA (abdominal aortic aneurysm) ([place])    Bladder cancer ([place])    prostate   COPD (chronic obstructive pulmonary disease) (HCC)    Hypertension     Medications:  Medications Prior to Admission  Medication Sig Dispense Refill Last Dose   albuterol (PROVENTIL HFA;VENTOLIN HFA) 108 (90 BASE) MCG/ACT inhaler Inhale 2 puffs into the lungs every 6 (six) hours as needed. For shortness of breath.   unknown   alendronate (FOSAMAX) 70 MG tablet Take 70 mg by mouth once a week. Sundays   04/15/2021   amLODipine (NORVASC) 5 MG tablet Take 5 mg by mouth 2 (two) times daily.   04/16/2021   ascorbic acid (VITAMIN C) 500 MG tablet Take 1 tablet by mouth daily.   04/16/2021   aspirin EC 81 MG tablet Take 81 mg by mouth.   04/16/2021    atorvastatin (LIPITOR) 40 MG tablet Take 40 mg by mouth every evening.   04/16/2021   cholecalciferol (VITAMIN D) 1000 UNITS tablet Take 2,000 Units by mouth daily.   04/16/2021   clopidogrel (PLAVIX) 75 MG tablet Take 75 mg by mouth daily.   04/17/2021 at 0645   docusate sodium (COLACE) 100 MG capsule Take 100 mg by mouth 2 (two) times daily as needed (constipation).   04/16/2021   ferrous sulfate 325 (65 FE) MG tablet Take 325 mg by mouth every Monday, Wednesday, and Friday.   04/16/2021   leuprolide, 6 Month, (ELIGARD) 45 MG injection Inject 45 mg into the skin every 6 (six) months.   February   lisinopril (ZESTRIL) 20 MG tablet Take 20 mg by mouth 2 (two) times daily.   04/17/2021  metoprolol tartrate (LOPRESSOR) 25 MG tablet Take 12.5 mg by mouth 2 (two) times daily.   04/17/2021 at 0645   Multiple Vitamins-Minerals (PRESERVISION AREDS PO) Take 1 tablet by mouth 2 (two) times daily.   04/16/2021   Tiotropium Bromide-Olodaterol (STIOLTO RESPIMAT) 2.5-2.5 MCG/ACT AERS Inhale 2 puffs into the lungs daily.   04/17/2021   XTANDI 40 MG tablet Take 160 mg by mouth daily.   04/17/2021   oxyCODONE-acetaminophen (PERCOCET/ROXICET) 5-325 MG per tablet Take 1-2 tablets by mouth every 6 (six) hours as needed for pain. (Patient not taking: No sig reported) 20 tablet 0 Not Taking   Scheduled:   arformoterol  15 mcg Nebulization BID   And   umeclidinium bromide  1 puff Inhalation Daily   ascorbic acid  500 mg Oral Daily   atorvastatin  40 mg Oral QPM   azithromycin  250 mg Oral Daily   Chlorhexidine Gluconate Cloth  6 each Topical Daily   cholecalciferol  2,000 Units Oral Daily   enoxaparin (LOVENOX) injection  70 mg Subcutaneous BID   enzalutamide  160 mg Oral Daily   metoprolol tartrate  12.5 mg Oral BID   pantoprazole (PROTONIX) IV  40 mg Intravenous QHS   predniSONE  40 mg Oral Q breakfast   Infusions:   sodium chloride Stopped (04/17/21 1351)    Assessment: Pt was admitted for COPD/dyspnea. He has  been on Xtandi for his prostate CA. Apixaban was ordered for his afib today but there is a major interaction with his Xtandi since it's an inducer. D/w Dr Wyline Copas and we will change it to full dose lovenox for now.   Goal of Therapy:  Anti-Xa level 0.6-1 units/ml 4hrs after LMWH dose given Monitor platelets by anticoagulation protocol: Yes   Plan:  Lovenox 70mg  SQ BID F/u PO AC  Onnie Boer, PharmD, BCIDP, AAHIVP, CPP Infectious Disease Pharmacist 04/19/2021 10:33 AM

## 2021-04-19 NOTE — TOC Initial Note (Signed)
Transition of Care [facility]) - Initial/Assessment Note    Patient Details  Name: Daniel King MRN: [id number] Date of Birth: 12/19/1945  Transition of Care [facility]) CM/SW Contact:    Joanne Chars, LCSW Phone Number: 04/19/2021, 3:39 PM  Clinical Narrative:    CSW met with pt regarding discharge recommendations.  Pt reports he lives with his wife who is home full time and able to assist him.  He would like to have [facility] services.  Choice document given, permission given to speak with wife, [name].  Pt said wife is here but currently stepped out.  Pt is followed at [facility] [place].  Pt is not vaccinated for covid and does not want to be vaccinated.  Current equipment in home: walker, wheelchair.    Per MD, pt will require home O2, CSW called and LM for Gabriel Earing at [facility] to request this.                Expected Discharge Plan: [place] Barriers to Discharge: Continued Medical Work up   Patient Goals and CMS Choice Patient states their goals for this hospitalization and ongoing recovery are:: "get back to normal" CMS Medicare.gov Compare Post Acute Care list provided to:: Patient Choice offered to / list presented to : Patient  Expected Discharge Plan and Services Expected Discharge Plan: [place] In-house Referral: Clinical Social Work   Post Acute Care Choice: [place] arrangements for the past 2 months: [place]                                      Prior Living Arrangements/Services Living arrangements for the past 2 months: Single Family Home Lives with:: Spouse Patient language and need for interpreter reviewed:: Yes Do you feel safe going back to the place where you live?: Yes      Need for Family Participation in Patient Care: Yes (Comment) Care giver support system in place?: Yes (comment) Current home services: Other (comment) (none) Criminal Activity/Legal Involvement Pertinent to  Current Situation/Hospitalization: No - Comment as needed  Activities of Daily Living      Permission Sought/Granted Permission sought to share information with : Family Supports Permission granted to share information with : Yes, Verbal Permission Granted  Share Information with NAME: wife [name]  Permission granted to share info w AGENCY: HH        Emotional Assessment Appearance:: Appears stated age Attitude/Demeanor/Rapport: Engaged Affect (typically observed): Appropriate, Pleasant Orientation: : Oriented to Self, Oriented to Place, Oriented to  Time, Oriented to Situation Alcohol / Substance Use: Not Applicable Psych Involvement: No (comment)  Admission diagnosis:  Respiratory failure (HCC) [J96.90] COPD exacerbation ([place]) [J44.1] Acute on chronic respiratory failure with hypoxia and hypercapnia (HCC) [J96.21, J96.22] Community acquired pneumonia of right lower lobe of lung [J18.9] Patient Active Problem List   Diagnosis Date Noted   COPD exacerbation ([place]) 04/17/2021   Respiratory failure ([place]) 04/17/2021   Pressure injury of skin 04/17/2021   Pain in left knee 08/17/2019   PCP:  Henreitta Cea, MD Pharmacy:   [facility], [place] [address] Phone: [phone] Fax: [phone]     Social Determinants of Health (SDOH) Interventions    Readmission Risk Interventions No flowsheet data found.

## 2021-04-19 NOTE — Progress Notes (Signed)
MD made aware of the concerns patient's wife had regarding the patient. Per wife patient is not mentally oriented compared to yesterday. Patient seems to be A&O*3. Able to answer questions asked. Patient has been lethargic and tired throughout the day. Will continue to monitor.

## 2021-04-19 NOTE — Progress Notes (Signed)
  Echocardiogram 2D Echocardiogram has been performed.  Raiquan Chandler G Travell Desaulniers 04/19/2021, 11:52 AM

## 2021-04-19 NOTE — Progress Notes (Signed)
NAME:  Daniel King, MRN:  [id number], DOB:  1946/01/13, LOS: 2 ADMISSION DATE:  04/17/2021, CONSULTATION DATE:  04/17/21 REFERRING MD:  Regenia Skeeter CHIEF COMPLAINT:  Dyspnea   History of Present Illness:  Daniel King is a 75 y.o. male who has a PMH including COPD per report (no PFT's available in our system to confirm or assess severity). He presented to [facility] ED 6/14 with dyspnea x 4-5 days.  He had attempted to use his home inhalers without any response.  EMS was called and upon their arrival, his sats were found to be in the 40s.  He was placed on a NRB with improvement only to 33s.  He was subsequently placed on CPAP and transported to the ED.  In the ED, ABG demonstrated respiratory acidosis.  He was started on BiPAP.  VBG 3 hours later was essentially unchanged.  PCCM subsequently asked to see in consultation.  Pertinent  Medical History:  has Pain in left knee; COPD exacerbation ([place]); Respiratory failure ([place]); and Pressure injury of skin on their problem list. COPD. Prostate cancer EtOH abuse  Significant Hospital Events: Including procedures, antibiotic start and stop dates in addition to other pertinent events   6/14 > admit.  Micro Data: Flu 6/14 > neg. SARS CoV2 6/14 > neg.  Antibiotics: Ceftriaxone 6/14 >  Azithromycin 6/15>  Interim History / Subjective:  More confused compared to yesterday per wife at bedside.  Objective:  Blood pressure 129/69, pulse 82, temperature 97.9 F (36.6 C), temperature source Oral, resp. rate 18, height 6' (1.829 m), weight 68.1 kg, SpO2 97 %.        Intake/Output Summary (Last 24 hours) at 04/19/2021 1313 Last data filed at 04/19/2021 0550 Gross per 24 hour  Intake 960 ml  Output 1050 ml  Net -90 ml    Filed Weights   04/17/21 0831 04/18/21 0500 04/19/21 0541  Weight: 63.5 kg 65.2 kg 68.1 kg    Examination: General: Chronically ill appearing elderly man sitting in the recliner in NAD Neuro: awake, watching TV, no  focal deficits HEENT:  [place]/AT Cardiovascular: distant heart sounds Lungs: expiratory wheezing throughout, some purse-lipped breathing but no accessory muscle use. Remains on 2L O2 Abdomen: soft, NT Musculoskeletal: muscle wasting, no edema Skin: warm, dry  Labs/imaging personally reviewed:  Echo: LVEF 65-70% with normal diastolic function. Normal RV. Aortic sclerosis without stenosis.  Resolved Hospital Problem list:   Assessment & Plan:   AECOPD - no PFT's in our system to confirm his diagnosis Acute hypoxic and hypercapnic respiratory failure due to AECOPD. - wean O2 as able to maintain SpO2 >90% -escalating neb regimen given wheezing> duonebs Q4h PRN, adding yupelri rather than Incruse due to inability to generate enough flow to get the meds into his airways at this phase. Can go home on home Stiolto and albuterol. Does not need spiriva with stiolto. - con't prednisone 40mg  daily -_complete 5 day course of azithromycin - Hold home Stiolto, Spiriva, Albuterol. - Pulmonary hygiene. - Ambulate as able. - Ambulatory desaturation study prior to d/c to assess for home O2 needs. - Need to attempt to wean off oxygen prior to discharge. - Follows with [facility]-- needs to follow up with them after discharge. Needs OP lung cancer screening.  Acute encephalopathy, suspect delirium. Not agitated to suggest ETOH withdrawal. -OOB mobility- d/w RN, PR & OT consults placed -day/ night orientation, encouraged appropriate sleep hygiene  History of prostate cancer -Holding oncology medications for prostate cancer  Daily consumption of alcohol -Thiamine /folic acid -restr of management per primary  Hyponatremia, improving -monitor  PCCM will continue to follow.  Best practice (evaluated daily):  Per primary    Julian Hy, DO 04/19/21 1:26 PM [facility]

## 2021-04-20 ENCOUNTER — Inpatient Hospital Stay (HOSPITAL_COMMUNITY): Payer: No Typology Code available for payment source

## 2021-04-20 DIAGNOSIS — G9341 Metabolic encephalopathy: Secondary | ICD-10-CM | POA: Diagnosis not present

## 2021-04-20 DIAGNOSIS — J9622 Acute and chronic respiratory failure with hypercapnia: Secondary | ICD-10-CM | POA: Diagnosis not present

## 2021-04-20 DIAGNOSIS — J9621 Acute and chronic respiratory failure with hypoxia: Secondary | ICD-10-CM | POA: Diagnosis not present

## 2021-04-20 DIAGNOSIS — J189 Pneumonia, unspecified organism: Secondary | ICD-10-CM

## 2021-04-20 LAB — URINALYSIS, MICROSCOPIC (REFLEX): RBC / HPF: >50 RBC/hpf (ref 0–5)

## 2021-04-20 LAB — COMPREHENSIVE METABOLIC PANEL
ALT: 8 U/L (ref 0–44)
AST: 10 U/L — ABNORMAL LOW (ref 15–41)
Albumin: 2.2 g/dL — ABNORMAL LOW (ref 3.5–5.0)
Alkaline Phosphatase: 75 U/L (ref 38–126)
Anion gap: 5 (ref 5–15)
BUN: 9 mg/dL (ref 8–23)
CO2: 35 mmol/L — ABNORMAL HIGH (ref 22–32)
Calcium: 7.9 mg/dL — ABNORMAL LOW (ref 8.9–10.3)
Chloride: 93 mmol/L — ABNORMAL LOW (ref 98–111)
Creatinine, Ser: 0.68 mg/dL (ref 0.61–1.24)
GFR, Estimated: >60 mL/min (ref >60)
Glucose, Bld: 95 mg/dL (ref 70–99)
Potassium: 5.3 mmol/L — ABNORMAL HIGH (ref 3.5–5.1)
Sodium: 133 mmol/L — ABNORMAL LOW (ref 135–145)
Total Bilirubin: 0.6 mg/dL (ref 0.3–1.2)
Total Protein: 5.3 g/dL — ABNORMAL LOW (ref 6.5–8.1)

## 2021-04-20 LAB — CBC
HCT: 33.4 % — ABNORMAL LOW (ref 39.0–52.0)
HCT: 33.4 % — ABNORMAL LOW (ref 39.0–52.0)
Hemoglobin: 10 g/dL — ABNORMAL LOW (ref 13.0–17.0)
Hemoglobin: 9.9 g/dL — ABNORMAL LOW (ref 13.0–17.0)
MCH: 27.5 pg (ref 26.0–34.0)
MCH: 27.6 pg (ref 26.0–34.0)
MCHC: 29.9 g/dL — ABNORMAL LOW (ref 30.0–36.0)
MCHC: 29.6 g/dL — ABNORMAL LOW (ref 30.0–36.0)
MCV: 91.8 fL (ref 80.0–100.0)
MCV: 93 fL (ref 80.0–100.0)
Platelets: 342 10*3/uL (ref 150–400)
Platelets: 352 10*3/uL (ref 150–400)
RBC: 3.64 MIL/uL — ABNORMAL LOW (ref 4.22–5.81)
RBC: 3.59 MIL/uL — ABNORMAL LOW (ref 4.22–5.81)
RDW: 17.2 % — ABNORMAL HIGH (ref 11.5–15.5)
RDW: 17.1 % — ABNORMAL HIGH (ref 11.5–15.5)
WBC: 9.9 10*3/uL (ref 4.0–10.5)
WBC: 8.6 10*3/uL (ref 4.0–10.5)
nRBC: 0.3 % — ABNORMAL HIGH (ref 0.0–0.2)
nRBC: 0.4 % — ABNORMAL HIGH (ref 0.0–0.2)

## 2021-04-20 LAB — URINALYSIS, ROUTINE W REFLEX MICROSCOPIC
Bilirubin Urine: NEGATIVE
Glucose, UA: NEGATIVE mg/dL
Ketones, ur: NEGATIVE mg/dL
Leukocytes,Ua: NEGATIVE
Nitrite: NEGATIVE
Protein, ur: 100 mg/dL — AB
Specific Gravity, Urine: 1.025 (ref 1.005–1.030)
pH: 6.5 (ref 5.0–8.0)

## 2021-04-20 LAB — BLOOD GAS, ARTERIAL
Acid-Base Excess: 12.5 mmol/L — ABNORMAL HIGH (ref 0.0–2.0)
Bicarbonate: 39 mmol/L — ABNORMAL HIGH (ref 20.0–28.0)
Drawn by: 42783
FIO2: 40
O2 Saturation: 97.9 %
Patient temperature: 37
pCO2 arterial: 79.6 mmHg (ref 32.0–48.0)
pH, Arterial: 7.311 — ABNORMAL LOW (ref 7.350–7.450)
pO2, Arterial: 106 mmHg (ref 83.0–108.0)

## 2021-04-20 LAB — LEGIONELLA PNEUMOPHILA SEROGP 1 UR AG: L. pneumophila Serogp 1 Ur Ag: NEGATIVE

## 2021-04-20 LAB — MAGNESIUM: Magnesium: 1.9 mg/dL (ref 1.7–2.4)

## 2021-04-20 LAB — AMMONIA: Ammonia: 44 umol/L — ABNORMAL HIGH (ref 9–35)

## 2021-04-20 NOTE — Progress Notes (Signed)
Patient removed BIPAP mask again and is refusing to wear it. Patient placed on 3L n asal cannula and is currently tolerating well.

## 2021-04-20 NOTE — Progress Notes (Deleted)
Pt previously placed on Bipap due to critical PCO2 of 78.9.  He was able to stay on Bipap for about 2 hr, and pulled mask off. Pt alert and oriented x 4, SpO2 97% on 3 L nasal cannula. RN explained to pt importance of Bipap and possible consequences if he refuses. Pt stated" I understand, but it is uncomfortable, and I do not want it". Will attempt to put Bipap on later.Opyd, MD notified. Will continue to monitor pt closely.

## 2021-04-20 NOTE — Progress Notes (Signed)
OT Cancellation Note  Patient Details Name: Daniel King MRN: [id number] DOB: [birth year]   Cancelled Treatment:    Reason Eval/Treat Not Completed: Medical issues which prohibited therapy. Per RN, patient placed back on BiPAP. Patient now with increased lethargy and decreased responsiveness. OT to hold at this time.   Gloris [place] OTR/L Supplemental OT, Department of rehab services [phone]  Bernardo Brayman R H. 04/20/2021, 12:03 PM

## 2021-04-20 NOTE — Progress Notes (Signed)
Patient tolerating bipap well. Attempted to remove bipap three times since placed, but easily redirected. Patient requested small break from bipap, nurse obligedin order to give lipitor.

## 2021-04-20 NOTE — Progress Notes (Signed)
RT obtained pt's ABG. ABG was sent to main lab and main lab was notified.

## 2021-04-20 NOTE — Progress Notes (Signed)
RN of pt called this RT stating that pt's VSS but pt seems more lethargic. RT at bedside to assess pt. RT placed pt on BiPAP. Pt tolerating well at this time with SVS. RT will continue to monitor pt.

## 2021-04-20 NOTE — Progress Notes (Signed)
RT at bedside for morning rounds. RN of pt stated that pt wore BiPAP on and off until 4:00am this morning. Per RN, pt wore BiPAP from around 4:00am to 7:00am and tolerated well with improvement to pt's SpO2. Pt was removed from BiPAP and RT found pt on 3LNC. Pt tolerating well at this time with SVS. RT will continue to monitor pt as needed.

## 2021-04-20 NOTE — Progress Notes (Addendum)
NAME:  HURBERT King, MRN:  [id number], DOB:  03/17/1946, LOS: 3 ADMISSION DATE:  04/17/2021, CONSULTATION DATE:  04/17/21 REFERRING MD:  Daniel King CHIEF COMPLAINT:  Dyspnea   History of Present Illness:  Daniel King is a 75 y.o. male who has a PMH including COPD per report (no PFT's available in our system to confirm or assess severity). He presented to [facility] ED 6/14 with dyspnea x 4-5 days.  He had attempted to use his home inhalers without any response.  EMS was called and upon their arrival, his sats were found to be in the 40s.  He was placed on a NRB with improvement only to 21s.  He was subsequently placed on CPAP and transported to the ED.  In the ED, ABG demonstrated respiratory acidosis.  He was started on BiPAP.  VBG 3 hours later was essentially unchanged.  PCCM subsequently asked to see in consultation.  Pertinent  Medical History:  has Pain in left knee; COPD exacerbation ([place]); Respiratory failure ([place]); and Pressure injury of skin on their problem list. COPD. Prostate cancer EtOH abuse  Significant Hospital Events: Including procedures, antibiotic start and stop dates in addition to other pertinent events   6/14 > admit  Micro Data: Flu 6/14 > neg. SARS CoV2 6/14 > neg.  Antibiotics: Ceftriaxone 6/14 >  Azithromycin 6/15>  Interim History / Subjective:  Very somnolent this morning. Patient  wore BiPAP 4 am - 7 am only. Refused earlier in the night Remains on oxygen at 3 L  [place] , sats are 100% which is too high for this patient.  I have asked nursing to place BiPAP now.  Will reassess in 30 minutes with VBG or ABG if he does not look better   Objective:  Blood pressure 140/69, pulse 86, temperature 98.6 F (37 C), temperature source Oral, resp. rate 20, height 6' (1.829 m), weight 68.1 kg, SpO2 100 %.    FiO2 (%):  [40 %] 40 %   Intake/Output Summary (Last 24 hours) at 04/20/2021 1025 Last data filed at 04/20/2021 0655 Gross per 24 hour  Intake 0 ml   Output 1350 ml  Net -1350 ml   Filed Weights   04/17/21 0831 04/18/21 0500 04/19/21 0541  Weight: 63.5 kg 65.2 kg 68.1 kg    Examination: General: Chronically ill appearing elderly man asleep in bed and hard to arouse  Neuro: Arouses to stimulation, MAE x 4, oriented to self and place only HEENT:  [place]/AT, No LAD, No JVD Cardiovascular: S1, S2, RRR, No distant heart sounds Lungs: Bilateral chest excursion, Bilateral wheezing noted throughout, diminished per bases Abdomen: soft, NT, NT, BS +, Body mass index is 20.36 kg/m.  Musculoskeletal: muscle wasting, no edema, no obvious deformities noted Skin: warm, dry, intact  Labs/imaging personally reviewed:  Echo: LVEF 65-70% with normal diastolic function. Normal RV. Aortic sclerosis without stenosis.  Na 133 K 5.3 Creatinine 0.68 HGB 10, WBC 9.9, platelets 342  Resolved Hospital Problem list:   Assessment & Plan:   AECOPD - no PFT's in our system to confirm his diagnosis Acute hypoxic and hypercapnic respiratory failure due to AECOPD. Increasing somnolence 6/17  - wean O2 as able to maintain SpO2 88-92% - BiPAP now and every night without fail. - BiPAP with naps  - Will check Mag level now - Stop Icruse due to inability to generate enough flow to get the meds into his airways at this phase.  - Can go home on home Stiolto  and albuterol. Does not need spiriva with stiolto. - con't prednisone 40mg  daily -_complete 5 day course of azithromycin - Hold home Stiolto, Spiriva, Albuterol. - Aggressive Pulmonary hygiene. - Ambulate as able. - OOB to chair - Ambulatory desaturation study prior to d/c to assess for home O2 needs. - Need to attempt to wean off oxygen prior to discharge. - Follows with [facility]-- needs to follow up with them after discharge. Needs OP lung cancer screening.  Acute encephalopathy, suspect delirium. Not agitated to suggest ETOH withdrawal. - OOB  to chair mobility- d/w RN, PR & OT consults  placed - day/ night orientation, encouraged appropriate sleep hygiene - check ammonia level  History of prostate cancer -Holding oncology medications for prostate cancer  Daily consumption of alcohol -Thiamine /folic acid -rest of management per primary  Hyponatremia, improving -monitor  PCCM will continue to follow.  Pt. Is going to need to be more compliant with his BiPAP therapy, unless he is more adherent , aggressive medical modalities of care will be futile, and change in code status should be discussed .  Best practice (evaluated daily):  Per primary    Daniel King, AGACNP-BC  04/20/21 10:25 AM [facility]

## 2021-04-20 NOTE — TOC Progression Note (Signed)
Transition of Care [facility]) - Progression Note    Patient Details  Name: JENTRY WARNELL MRN: [id number] Date of Birth: [birth year]  [facility] of [facility]) CM/SW Contact  Joanne Chars, LCSW Phone Number: 04/20/2021, 1:03 PM  Clinical Narrative:  CSW spoke with Gabriel Earing, [facility] CSW.  For home O2, she needs DME home O2 order and saturation note faxed to two places: Home O2 clinic fax [phone] and to PCP Dr Nyoka Cowden fax [phone].  CSW spoke with wife Caren Griffins to confirm plan for [facility].  She asked for a chance to talk to MD before moving forward, said she is home full time but wants to make sure she can meet pt needs.  She does think it is possible pt may need SNF.  CSW will speak with her after she speaks to MD.     Expected Discharge Plan: [place] Barriers to Discharge: Continued Medical Work up  Expected Discharge Plan and Services Expected Discharge Plan: [place] In-house Referral: Clinical Social Work   Post Acute Care Choice: [place] arrangements for the past 2 months: Single Family Home                                       Social Determinants of Health (SDOH) Interventions    Readmission Risk Interventions No flowsheet data found.

## 2021-04-20 NOTE — Progress Notes (Signed)
Entered patient room for rounds, patient oxygen saturation 98% on 3L. Patient very lethargic, responding only physical stimulation. Patient following commands and oriented. Called Resp Therapist Katie, and requested patient be placed back on Bipap. Called patient wife Caren Griffins and provided update. Pulmonary NP Fredia Beets, MD alerted.

## 2021-04-20 NOTE — Progress Notes (Signed)
PT Cancellation Note  Patient Details Name: Daniel King MRN: [id number] DOB: 02/07/1946   Cancelled Treatment:    Reason Eval/Treat Not Completed: Medical issues which prohibited therapy.  Breathing is challenged, on Cpap and PCO2 is high.  Hold PT for now.   Ramond Dial 04/20/2021, 1:37 PM  Mee Hives, PT MS Acute Rehab Dept. Number: [place]

## 2021-04-20 NOTE — Progress Notes (Signed)
Pt placed back on Bipap 

## 2021-04-20 NOTE — Progress Notes (Signed)
RN talked to pt again, and was able to place Bipap on at this time.

## 2021-04-20 NOTE — Progress Notes (Signed)
Patient urine dark red, Wyline Copas, MD alerted.

## 2021-04-20 NOTE — Progress Notes (Signed)
Pt took off Bipap again, refused to place it back on.  SpO2 96 on 3 L nasal cannula.  Pt more alert and interactive this am, asking for food and drink.

## 2021-04-20 NOTE — Progress Notes (Signed)
Patient PCO2 critical at 79.6. Bipap still in place and functioning properly. Wyline Copas, MD alerted, and Elie Confer, NP alerted.

## 2021-04-20 NOTE — Progress Notes (Signed)
PROGRESS NOTE    HORST OSTERMILLER  YCX:[id number] DOB: 18-Sep-1946 DOA: 04/17/2021 PCP: Henreitta Cea, MD    Brief Narrative:  75yo with hx copd, prostate caner, CVA within the past year, hx ETOH abuse who presented to the ED with worsening sob despite inhalers, ultimately admitted to ICU service, requiring BiPAP, now transferred to [place]:   Active Problems:   COPD exacerbation (HCC)   Respiratory failure (HCC)   Pressure injury of skin  COPD exacerbation PCCM following Cont on neb tx and steroids Remains drowsy  Serial abg's reviewed. Findings consistent with continued co2 retention and acidosis Pt now remains dependent on bipap PRN Afib RVR, new diagnosis Recent evidence of afib on ekg Currently rate controlled Continued on scheduled metoprolol See below, now with hematuria. Lovenox now on hold Given hx of prostate cancer with developing hematuria, question if patient would be a candidate for anticoagulation moving forward Hx CVA Per family, was diagnosed with CVA around 8/21 Had been on ASA and plavix Given new afib, have transitioned to full therapeutic anticoagulation after discussion with family, who agrees Recommend close f/u with Neurology when discharged Hx prostate cancer Noted to have hematuria today while on therapeutic anticoagulation Hold further lovenox Follow CBC trends Acute toxic metabolic encephalopathy Tired appearing this AM Cont to treat copd above Hx ETOH abuse On librium currently Most recent CIWA noted to be 0 Hyponatremia Noted to be improving Recheck bmet in AM Acquired thrombophilia Secondary to above afib Lovenox now on hold secondary to hematuria  DVT prophylaxis: scd's Code Status: Full Family Communication: Pt in room, family at bedside  Status is: Inpatient  Remains inpatient appropriate because:Inpatient level of care appropriate due to severity of illness  Dispo: The patient is from: Home               Anticipated d/c is to: Home              Patient currently is not medically stable to d/c.   Difficult to place patient No       Consultants:  PCCM  Procedures:    Antimicrobials: Anti-infectives (From admission, onward)    Start     Dose/Rate Route Frequency Ordered Stop   04/18/21 1000  azithromycin (ZITHROMAX) tablet 250 mg        250 mg Oral Daily 04/18/21 0725 04/22/21 0959   04/17/21 0845  cefTRIAXone (ROCEPHIN) 2 g in sodium chloride 0.9 % 100 mL IVPB  Status:  Discontinued        2 g 200 mL/hr over 30 Minutes Intravenous Every 24 hours 04/17/21 0834 04/18/21 0725   04/17/21 0845  azithromycin (ZITHROMAX) 500 mg in sodium chloride 0.9 % 250 mL IVPB  Status:  Discontinued        500 mg 250 mL/hr over 60 Minutes Intravenous Every 24 hours 04/17/21 0834 04/18/21 0725       Subjective: Reports feeling better  Objective: Vitals:   04/20/21 0730 04/20/21 1040 04/20/21 1251 04/20/21 1548  BP:   (!) 114/59   Pulse: 86 87 68 67  Resp: 20 (!) 27 20 20   Temp:   98.1 F (36.7 C)   TempSrc:   Oral   SpO2: 100% 98% 100% 100%  Weight:      Height:        Intake/Output Summary (Last 24 hours) at 04/20/2021 1555 Last data filed at 04/20/2021 0800 Gross per 24 hour  Intake 300 ml  Output 1350  ml  Net -1050 ml    Filed Weights   04/17/21 0831 04/18/21 0500 04/19/21 0541  Weight: 63.5 kg 65.2 kg 68.1 kg    Examination: General exam: Conversant, in no acute distress Respiratory system: normal chest rise, clear, no audible wheezing, decreased BS Cardiovascular system: regular rhythm, s1-s2 Gastrointestinal system: Nondistended, nontender, pos BS Central nervous system: No seizures, no tremors Extremities: No cyanosis, no joint deformities Skin: No rashes, no pallor Psychiatry: Affect normal // no auditory hallucinations   Data Reviewed: I have personally reviewed following labs and imaging studies  CBC: Recent Labs  Lab 04/17/21 0844 04/17/21 0848  04/17/21 1311 04/17/21 1613 04/18/21 0239 04/18/21 0414 04/20/21 0212  WBC 11.6*  --   --  7.1 12.7*  --  9.9  NEUTROABS 10.2*  --   --   --   --   --   --   HGB 11.6*   < > 12.9* 10.6* 10.0* 11.6* 10.0*  HCT 38.1*   < > 38.0* 35.2* 32.9* 34.0* 33.4*  MCV 91.4  --   --  90.7 89.9  --  91.8  PLT 416*  --   --  364 359  --  342   < > = values in this interval not displayed.    Basic Metabolic Panel: Recent Labs  Lab 04/17/21 1613 04/18/21 0239 04/18/21 0414 04/18/21 2013 04/19/21 0310 04/20/21 0212 04/20/21 1103  NA 130* 132* 131* 133* 134* 133*  --   K 4.3 4.4 4.4 4.7 4.8 5.3*  --   CL 88* 95*  --  94* 93* 93*  --   CO2 32 29  --  31 35* 35*  --   GLUCOSE 119* 135*  --  109* 102* 95  --   BUN 8 10  --  11 13 9   --   CREATININE 0.81 0.81  --  0.94 0.96 0.68  --   CALCIUM 8.1* 8.2*  --  8.2* 8.1* 7.9*  --   MG 1.7 1.8  --  2.2 2.1  --  1.9  PHOS 3.8 3.3  --   --   --   --   --     GFR: Estimated Creatinine Clearance: 76.8 mL/min (by C-G formula based on SCr of 0.68 mg/dL). Liver Function Tests: Recent Labs  Lab 04/17/21 0844 04/17/21 1613 04/20/21 0212  AST 12* 14* 10*  ALT 8 8 8   ALKPHOS 104 97 75  BILITOT 0.2* 0.4 0.6  PROT 6.5 5.7* 5.3*  ALBUMIN 2.7* 2.4* 2.2*    No results for input(s): LIPASE, AMYLASE in the last 168 hours. Recent Labs  Lab 04/20/21 1103  AMMONIA 44*   Coagulation Profile: Recent Labs  Lab 04/17/21 0844 04/17/21 1613  INR 1.0 1.0    Cardiac Enzymes: No results for input(s): CKTOTAL, CKMB, CKMBINDEX, TROPONINI in the last 168 hours. BNP (last 3 results) No results for input(s): PROBNP in the last 8760 hours. HbA1C: No results for input(s): HGBA1C in the last 72 hours. CBG: Recent Labs  Lab 04/17/21 0840  GLUCAP 150*    Lipid Profile: No results for input(s): CHOL, HDL, LDLCALC, TRIG, CHOLHDL, LDLDIRECT in the last 72 hours. Thyroid Function Tests: No results for input(s): TSH, T4TOTAL, FREET4, T3FREE, THYROIDAB in  the last 72 hours. Anemia Panel: No results for input(s): VITAMINB12, FOLATE, FERRITIN, TIBC, IRON, RETICCTPCT in the last 72 hours. Sepsis Labs: Recent Labs  Lab 04/17/21 0845 04/17/21 1049 04/17/21 1613  PROCALCITON  --   --  <  0.10  LATICACIDVEN 1.8 1.6  --      Recent Results (from the past 240 hour(s))  Blood Culture (routine x 2)     Status: None (Preliminary result)   Collection Time: 04/17/21  8:30 AM   Specimen: BLOOD RIGHT ARM  Result Value Ref Range Status   Specimen Description BLOOD RIGHT ARM  Final   Special Requests   Final    BOTTLES DRAWN AEROBIC AND ANAEROBIC Blood Culture adequate volume   Culture   Final    NO GROWTH 3 DAYS Performed at [facility], [address], [place], [place] [address]    Report Status PENDING  Incomplete  Resp Panel by RT-PCR (Flu A&B, Covid) Nasopharyngeal Swab     Status: None   Collection Time: 04/17/21  8:34 AM   Specimen: Nasopharyngeal Swab; Nasopharyngeal(NP) swabs in vial transport medium  Result Value Ref Range Status   SARS Coronavirus 2 by RT PCR NEGATIVE NEGATIVE Final    Comment: (NOTE) SARS-CoV-2 target nucleic acids are NOT DETECTED.  The SARS-CoV-2 RNA is generally detectable in upper respiratory specimens during the acute phase of infection. The lowest concentration of SARS-CoV-2 viral copies this assay can detect is 138 copies/mL. A negative result does not preclude SARS-Cov-2 infection and should not be used as the sole basis for treatment or other patient management decisions. A negative result may occur with  improper specimen collection/handling, submission of specimen other than nasopharyngeal swab, presence of viral mutation(s) within the areas targeted by this assay, and inadequate number of viral copies(<138 copies/mL). A negative result must be combined with clinical observations, patient history, and epidemiological information. The expected result is Negative.  Fact Sheet for Patients:   EntrepreneurPulse.com.au  Fact Sheet for Healthcare Providers:  IncredibleEmployment.be  This test is no t yet approved or cleared by the [place] FDA and  has been authorized for detection and/or diagnosis of SARS-CoV-2 by FDA under an Emergency Use Authorization (EUA). This EUA will remain  in effect (meaning this test can be used) for the duration of the COVID-19 declaration under Section 564(b)(1) of the Act, 21 U.S.C.section 360bbb-3(b)(1), unless the authorization is terminated  or revoked sooner.       Influenza A by PCR NEGATIVE NEGATIVE Final   Influenza B by PCR NEGATIVE NEGATIVE Final    Comment: (NOTE) The Xpert Xpress SARS-CoV-2/FLU/RSV plus assay is intended as an aid in the diagnosis of influenza from Nasopharyngeal swab specimens and should not be used as a sole basis for treatment. Nasal washings and aspirates are unacceptable for Xpert Xpress SARS-CoV-2/FLU/RSV testing.  Fact Sheet for Patients: EntrepreneurPulse.com.au  Fact Sheet for Healthcare Providers: IncredibleEmployment.be  This test is not yet approved or cleared by the [place] FDA and has been authorized for detection and/or diagnosis of SARS-CoV-2 by FDA under an Emergency Use Authorization (EUA). This EUA will remain in effect (meaning this test can be used) for the duration of the COVID-19 declaration under Section 564(b)(1) of the Act, 21 U.S.C. section 360bbb-3(b)(1), unless the authorization is terminated or revoked.  Performed at [facility], [place] [address] [place], [place] [address]   Blood Culture (routine x 2)     Status: None (Preliminary result)   Collection Time: 04/17/21  8:46 AM   Specimen: BLOOD LEFT ARM  Result Value Ref Range Status   Specimen Description BLOOD LEFT ARM  Final   Special Requests   Final    BOTTLES DRAWN AEROBIC AND ANAEROBIC Blood  Culture results may not be optimal due  to an inadequate volume of blood received in culture bottles   Culture   Final    NO GROWTH 3 DAYS Performed at [facility], [place] [address] [place], [place] [address]    Report Status PENDING  Incomplete  MRSA Next Gen by PCR, Nasal     Status: None   Collection Time: 04/17/21  3:13 PM  Result Value Ref Range Status   MRSA by PCR Next Gen NOT DETECTED NOT DETECTED Final    Comment: (NOTE) The GeneXpert MRSA Assay (FDA approved for NASAL specimens only), is one component of a comprehensive MRSA colonization surveillance program. It is not intended to diagnose MRSA infection nor to guide or monitor treatment for MRSA infections. Test performance is not FDA approved in patients less than 1 years old. Performed at [facility], [place] [address] [place], [place] [address]   Culture, blood (routine x 2)     Status: None (Preliminary result)   Collection Time: 04/17/21  4:05 PM   Specimen: BLOOD  Result Value Ref Range Status   Specimen Description BLOOD LEFT ANTECUBITAL  Final   Special Requests   Final    BOTTLES DRAWN AEROBIC ONLY Blood Culture results may not be optimal due to an inadequate volume of blood received in culture bottles   Culture   Final    NO GROWTH 3 DAYS Performed at [facility], [place] [address], [place], [place] [address]    Report Status PENDING  Incomplete  Culture, blood (routine x 2)     Status: None (Preliminary result)   Collection Time: 04/17/21  4:15 PM   Specimen: BLOOD RIGHT HAND  Result Value Ref Range Status   Specimen Description BLOOD RIGHT HAND  Final   Special Requests   Final    BOTTLES DRAWN AEROBIC ONLY Blood Culture adequate volume   Culture   Final    NO GROWTH 3 DAYS Performed at [facility], [place] [address] [place], [place] [address]    Report Status PENDING  Incomplete  Urine culture     Status: Abnormal   Collection Time: 04/17/21  6:33 PM   Specimen: In/Out Cath Urine  Result Value Ref Range Status    Specimen Description IN/OUT CATH URINE  Final   Special Requests   Final    NONE Performed at [facility], [place] [address] [place],  [address]    Culture MULTIPLE SPECIES PRESENT, SUGGEST RECOLLECTION (A)  Final   Report Status 04/18/2021 FINAL  Final      Radiology Studies: DG CHEST PORT 1 VIEW  Result Date: 04/20/2021 CLINICAL DATA:  Shortness of breath EXAM: PORTABLE CHEST 1 VIEW COMPARISON:  April 17, 2021 FINDINGS: There is ill-defined airspace opacity in the right lower lobe, increased from recent study. There is apparent volume loss in the right apex region. Elsewhere there is mild generalized interstitial thickening. Heart is upper normal in size with pulmonary vascularity normal. No adenopathy. There is aortic atherosclerosis. No appreciable bone lesions. IMPRESSION: 1. Ill-defined opacity right lower lobe, increased from recent study and felt to represent pneumonia. 2. Volume loss in the apical segment right upper lobe. Question second focus of pneumonia. An obstructing endobronchial lesion could present in this manner. Correlation with chest CT to further evaluate the right apex region is felt to be warranted. 3. Underlying interstitial thickening, likely representing a degree of chronic bronchitis. 4.  Heart upper  normal in size. 5.  Aortic Atherosclerosis (ICD10-I70.0). Electronically Signed   By: Lowella Grip III M.D.   On: 04/20/2021 12:38   ECHOCARDIOGRAM COMPLETE  Result Date: 04/19/2021    ECHOCARDIOGRAM REPORT   Patient Name:   NORRIS BRUMBACH II Date of Exam: 04/19/2021 Medical Rec #:  [id number]           Height:       72.0 in Accession #:    [id number]          Weight:       150.1 lb Date of Birth:  04/25/1946           BSA:          1.886 m Patient Age:    24 years            BP:           129/69 mmHg Patient Gender: M                   HR:           74 bpm. Exam Location:  Inpatient Procedure: 2D Echo, Cardiac Doppler and Color Doppler Indications:    I48.0  Paroxysmal atrial fibrillation  History:        Patient has no prior history of Echocardiogram examinations.                 COPD; Risk Factors:Hypertension. Abdominal Aortic Aneurysm.  Sonographer:    Jonelle Sidle Dance Referring Phys: 70 Wilferd Ritson K Daishia Fetterly  Sonographer Comments: Suboptimal apical window and suboptimal parasternal window. IMPRESSIONS  1. Left ventricular ejection fraction, by estimation, is 65 to 70%. The left ventricle has normal function. The left ventricle has no regional wall motion abnormalities. Left ventricular diastolic parameters were normal.  2. Right ventricular systolic function is normal. The right ventricular size is normal. Tricuspid regurgitation signal is inadequate for assessing PA pressure.  3. The mitral valve is normal in structure. No evidence of mitral valve regurgitation. No evidence of mitral stenosis.  4. The aortic valve is tricuspid. Aortic valve regurgitation is not visualized. Mild to moderate aortic valve sclerosis/calcification is present, without any evidence of aortic stenosis.  5. The inferior vena cava is normal in size with greater than 50% respiratory variability, suggesting right atrial pressure of 3 mmHg. FINDINGS  Left Ventricle: Left ventricular ejection fraction, by estimation, is 65 to 70%. The left ventricle has normal function. The left ventricle has no regional wall motion abnormalities. The left ventricular internal cavity size was normal in size. There is  no left ventricular hypertrophy. Left ventricular diastolic parameters were normal. Normal left ventricular filling pressure. Right Ventricle: The right ventricular size is normal. No increase in right ventricular wall thickness. Right ventricular systolic function is normal. Tricuspid regurgitation signal is inadequate for assessing PA pressure. Left Atrium: Left atrial size was normal in size. Right Atrium: Right atrial size was normal in size. Pericardium: There is no evidence of pericardial effusion.  Mitral Valve: The mitral valve is normal in structure. No evidence of mitral valve regurgitation. No evidence of mitral valve stenosis. Tricuspid Valve: The tricuspid valve is normal in structure. Tricuspid valve regurgitation is trivial. No evidence of tricuspid stenosis. Aortic Valve: The aortic valve is tricuspid. Aortic valve regurgitation is not visualized. Mild to moderate aortic valve sclerosis/calcification is present, without any evidence of aortic stenosis. Pulmonic Valve: The pulmonic valve was normal in structure. Pulmonic valve regurgitation is not visualized. No evidence of pulmonic  stenosis. Aorta: The aortic root is normal in size and structure. Venous: The inferior vena cava is normal in size with greater than 50% respiratory variability, suggesting right atrial pressure of 3 mmHg. IAS/Shunts: The interatrial septum appears to be lipomatous. No atrial level shunt detected by color flow Doppler.  LEFT VENTRICLE PLAX 2D LVIDd:         4.30 cm  Diastology LVIDs:         3.00 cm  LV e' medial:    9.03 cm/s LV PW:         1.30 cm  LV E/e' medial:  9.4 LV IVS:        1.00 cm  LV e' lateral:   11.30 cm/s LVOT diam:     1.90 cm  LV E/e' lateral: 7.5 LV SV:         58 LV SV Index:   31 LVOT Area:     2.84 cm  RIGHT VENTRICLE             IVC RV Basal diam:  3.10 cm     IVC diam: 1.80 cm RV Mid diam:    2.20 cm RV S prime:     12.50 cm/s TAPSE (M-mode): 2.2 cm LEFT ATRIUM             Index       RIGHT ATRIUM           Index LA diam:        3.40 cm 1.80 cm/m  RA Area:     15.70 cm LA Vol (A2C):   66.9 ml 35.47 ml/m RA Volume:   39.80 ml  21.10 ml/m LA Vol (A4C):   50.3 ml 26.67 ml/m LA Biplane Vol: 57.5 ml 30.49 ml/m  AORTIC VALVE LVOT Vmax:   73.80 cm/s LVOT Vmean:  49.800 cm/s LVOT VTI:    0.206 m  AORTA Ao Root diam: 3.60 cm MITRAL VALVE MV Area (PHT): 3.66 cm    SHUNTS MV Decel Time: 207 msec    Systemic VTI:  0.21 m MV E velocity: 84.80 cm/s  Systemic Diam: 1.90 cm MV A velocity: 66.80 cm/s MV E/A  ratio:  1.27 Fransico Him MD Electronically signed by Fransico Him MD Signature Date/Time: 04/19/2021/11:56:09 AM    Final     Scheduled Meds:  arformoterol  15 mcg Nebulization BID   ascorbic acid  500 mg Oral Daily   atorvastatin  40 mg Oral QPM   azithromycin  250 mg Oral Daily   Chlorhexidine Gluconate Cloth  6 each Topical Daily   cholecalciferol  2,000 Units Oral Daily   enzalutamide  160 mg Oral Daily   metoprolol tartrate  12.5 mg Oral BID   pantoprazole (PROTONIX) IV  40 mg Intravenous QHS   predniSONE  40 mg Oral Q breakfast   revefenacin  175 mcg Nebulization Daily   Continuous Infusions:  sodium chloride Stopped (04/17/21 1351)     LOS: 3 days   Marylu Lund, MD [facility] Pager On Amion  If 7PM-7AM, please contact night-coverage 04/20/2021, 3:55 PM

## 2021-04-20 NOTE — Progress Notes (Signed)
Pt previously placed on Bipap due to critical PCO2 of 78.9. He stayed on Bipap for 2 hr approximately, and than pulled his mask off. Per pt he cannot tolerate it because it is uncomfortable. RN explained to pt importance of using Bipap  and possible consequences if one is not used. Pt stated" I understand, but I still do not want it". Opyd, MD notified. Pt placed in nasal cannula. SpO2 98% on 3L. Pt alert and oriented x 4. Will continue to monitor pt.

## 2021-04-21 LAB — BLOOD GAS, ARTERIAL
Acid-Base Excess: 12.3 mmol/L — ABNORMAL HIGH (ref 0.0–2.0)
Bicarbonate: 38.4 mmol/L — ABNORMAL HIGH (ref 20.0–28.0)
Drawn by: 519031
FIO2: 28
O2 Saturation: 94.8 %
Patient temperature: 36.6
pCO2 arterial: 71.2 mmHg (ref 32.0–48.0)
pH, Arterial: 7.349 — ABNORMAL LOW (ref 7.350–7.450)
pO2, Arterial: 77.6 mmHg — ABNORMAL LOW (ref 83.0–108.0)

## 2021-04-21 LAB — COMPREHENSIVE METABOLIC PANEL
ALT: 8 U/L (ref 0–44)
AST: 10 U/L — ABNORMAL LOW (ref 15–41)
Albumin: 2.3 g/dL — ABNORMAL LOW (ref 3.5–5.0)
Alkaline Phosphatase: 71 U/L (ref 38–126)
Anion gap: 5 (ref 5–15)
BUN: 8 mg/dL (ref 8–23)
CO2: 41 mmol/L — ABNORMAL HIGH (ref 22–32)
Calcium: 8.4 mg/dL — ABNORMAL LOW (ref 8.9–10.3)
Chloride: 88 mmol/L — ABNORMAL LOW (ref 98–111)
Creatinine, Ser: 0.75 mg/dL (ref 0.61–1.24)
GFR, Estimated: >60 mL/min (ref >60)
Glucose, Bld: 95 mg/dL (ref 70–99)
Potassium: 4.8 mmol/L (ref 3.5–5.1)
Sodium: 134 mmol/L — ABNORMAL LOW (ref 135–145)
Total Bilirubin: 0.7 mg/dL (ref 0.3–1.2)
Total Protein: 5.6 g/dL — ABNORMAL LOW (ref 6.5–8.1)

## 2021-04-21 LAB — CBC
HCT: 33.8 % — ABNORMAL LOW (ref 39.0–52.0)
Hemoglobin: 9.9 g/dL — ABNORMAL LOW (ref 13.0–17.0)
MCH: 27 pg (ref 26.0–34.0)
MCHC: 29.3 g/dL — ABNORMAL LOW (ref 30.0–36.0)
MCV: 92.3 fL (ref 80.0–100.0)
Platelets: 363 10*3/uL (ref 150–400)
RBC: 3.66 MIL/uL — ABNORMAL LOW (ref 4.22–5.81)
RDW: 16.9 % — ABNORMAL HIGH (ref 11.5–15.5)
WBC: 7.4 10*3/uL (ref 4.0–10.5)
nRBC: 0.3 % — ABNORMAL HIGH (ref 0.0–0.2)

## 2021-04-21 MED ORDER — BUDESONIDE 0.5 MG/2ML IN SUSP
0.5000 mg | Freq: Two times a day (BID) | RESPIRATORY_TRACT | Status: DC
  Administered 2021-04-21 – 2021-04-27 (×12): 0.5 mg via RESPIRATORY_TRACT
  Filled 2021-04-21 (×12): qty 2

## 2021-04-21 NOTE — Progress Notes (Signed)
NAME:  Daniel King, MRN:  [id number], DOB:  [birth year], LOS: 4 ADMISSION DATE:  04/17/2021, CONSULTATION DATE:  04/17/21 REFERRING MD:  Regenia Skeeter CHIEF COMPLAINT:  Dyspnea   History of Present Illness:  Daniel King is a 75 y.o. male who has a PMH including COPD per report (no PFT's available in our system to confirm or assess severity). He presented to [facility] ED 6/14 with dyspnea x 4-5 days.  He had attempted to use his home inhalers without any response.  EMS was called and upon their arrival, his sats were found to be in the 40s.  He was placed on a NRB with improvement only to 48s.  He was subsequently placed on CPAP and transported to the ED.  In the ED, ABG demonstrated respiratory acidosis.  He was started on BiPAP.  VBG 3 hours later was essentially unchanged.  PCCM subsequently asked to see in consultation.  Pertinent  Medical History:  has Pain in left knee; COPD exacerbation ([place]); Respiratory failure ([place]); and Pressure injury of skin on their problem list. COPD. Prostate cancer EtOH abuse  Significant Hospital Events: Including procedures, antibiotic start and stop dates in addition to other pertinent events   6/14 > admit  Micro Data: Flu 6/14 > neg. SARS CoV2 6/14 > neg.  Antibiotics: Ceftriaxone 6/14 >  Azithromycin 6/15>  Interim History / Subjective:  NAEON. Wore bipap after better fitting mask. Sleepy but easily arousable.    Objective:  Blood pressure (!) 179/89, pulse 74, temperature 97.8 F (36.6 C), temperature source Oral, resp. rate 17, height 6' (1.829 m), weight 68.1 kg, SpO2 98 %.    FiO2 (%):  [30 %-40 %] 30 %   Intake/Output Summary (Last 24 hours) at 04/21/2021 0944 Last data filed at 04/21/2021 0700 Gross per 24 hour  Intake --  Output 4502 ml  Net -4502 ml    Filed Weights   04/17/21 0831 04/18/21 0500 04/19/21 0541  Weight: 63.5 kg 65.2 kg 68.1 kg    Examination: General: Chronically ill appearing elderly man asleep in bed,  easy to arouse  Neuro: Arouses to stimulation, MAE x 4, oriented to self and place only HEENT:  [place]/AT, No LAD, No JVD Cardiovascular: S1, S2, RRR, No distant heart sounds Lungs: Bilateral chest excursion, Bilateral wheezing noted throughout, diminished per bases Abdomen: soft, NT, NT, BS +, Body mass index is 20.36 kg/m.  Musculoskeletal: muscle wasting, no edema, no obvious deformities noted Skin: warm, dry, intact  Labs/imaging personally reviewed:  Echo: LVEF 65-70% with normal diastolic function. Normal RV. Aortic sclerosis without stenosis.  ABG, CXR, chemistries, CBC  Resolved Hospital Problem list:   Assessment & Plan:   AECOPD - no PFT's in our system to confirm his diagnosis Acute hypoxic and hypercapnic respiratory failure due to AECOPD. - wean O2 as able to maintain SpO2 88-90%, avoid sats > 92% - BiPAP qhs - BiPAP with naps  - Can go home on home Stiolto and albuterol. Does not need spiriva with stiolto. - con't prednisone 40mg  daily --complete 5 day course of azithromycin - Aggressive Pulmonary hygiene. - Ambulate as able. - OOB to chair - Ambulatory desaturation study prior to d/c to assess for home O2 needs. - Need to attempt to wean off oxygen prior to discharge - Follows with [facility]-- needs to follow up with them after discharge. Needs OP lung cancer screening - PCO2 elevated despite BiPAP - This patient requires non-invasive ventilator for acute in chronic respiratory  failure and COPD. Without ventilator, there is significant risk of untimely readmission to the hospital and increase on CO2 retention. BIPAP is ineffective in the home setting in reducing CO2 due to the lack of an auto-rate.   Acute encephalopathy, suspect delirium. Not agitated to suggest ETOH withdrawal. - OOB  to chair mobility- PT & OT consults placed - day/ night orientation, encouraged appropriate sleep hygiene  PCCM will continue to follow.  Patient must be more compliant with  his BiPAP therapy, unless he is more adherent , aggressive medical modalities of care will be futile, and change in code status should be discussed. Discussed with wife and patient at bedside today  Best practice (evaluated daily):  Per primary    Lanier Clam, MD 04/21/21 9:44 AM [facility] See Amion for contact details

## 2021-04-21 NOTE — Progress Notes (Signed)
RT NOTES: ABG obtained and sent to lab. Lab tech notified.  

## 2021-04-21 NOTE — Progress Notes (Signed)
PROGRESS NOTE    Daniel King  XKG:[id number] DOB: October 02, 1946 DOA: 04/17/2021 PCP: Henreitta Cea, MD    Brief Narrative:  75yo with hx copd, prostate caner, CVA within the past year, hx ETOH abuse who presented to the ED with worsening sob despite inhalers, ultimately admitted to ICU service, requiring BiPAP, now transferred to [place]:   Active Problems:   COPD exacerbation (HCC)   Respiratory failure (HCC)   Pressure injury of skin  COPD exacerbation PCCM following Cont on neb tx and steroids Remains drowsy  Serial abg's reviewed. Findings consistent with continued co2 retention and acidosis Has been requiring intermittent bipap Afib RVR, new diagnosis Recent evidence of afib on ekg Currently rate controlled Continued on scheduled metoprolol See below, now with hematuria. Lovenox now on hold Given hematuria, discussed risk benefit of anticoagulation. Decision was to resume ASA and plavix when hematuria has resolved and have pt f/u with Neurology to discuss anticoagulation moving forward Hx CVA Per family, was diagnosed with CVA around 8/21 Had been on ASA and plavix Given new afib, have transitioned to full therapeutic anticoagulation after discussion with family, who agrees Recommend close f/u with Neurology when discharged Hx prostate cancer Noted to have hematuria today while on therapeutic anticoagulation Hold further lovenox Continue to follow CBC, stable Acute toxic metabolic encephalopathy Tired appearing this AM Cont to treat copd as per above Hx ETOH abuse On librium currently Most recent CIWA noted to be 0 Hyponatremia Noted to be improving Recheck bmet in AM Acquired thrombophilia Secondary to above afib Lovenox now on hold secondary to hematuria  DVT prophylaxis: scd's Code Status: Full Family Communication: Pt in room, family at bedside  Status is: Inpatient  Remains inpatient appropriate because:Inpatient level  of care appropriate due to severity of illness  Dispo: The patient is from: Home              Anticipated d/c is to: Home              Patient currently is not medically stable to d/c.   Difficult to place patient No   Consultants:  PCCM  Procedures:    Antimicrobials: Anti-infectives (From admission, onward)    Start     Dose/Rate Route Frequency Ordered Stop   04/18/21 1000  azithromycin (ZITHROMAX) tablet 250 mg        250 mg Oral Daily 04/18/21 0725 04/21/21 0833   04/17/21 0845  cefTRIAXone (ROCEPHIN) 2 g in sodium chloride 0.9 % 100 mL IVPB  Status:  Discontinued        2 g 200 mL/hr over 30 Minutes Intravenous Every 24 hours 04/17/21 0834 04/18/21 0725   04/17/21 0845  azithromycin (ZITHROMAX) 500 mg in sodium chloride 0.9 % 250 mL IVPB  Status:  Discontinued        500 mg 250 mL/hr over 60 Minutes Intravenous Every 24 hours 04/17/21 0834 04/18/21 0725       Subjective: States feeling better  Objective: Vitals:   04/21/21 0738 04/21/21 1023 04/21/21 1120 04/21/21 1407  BP:    134/60  Pulse:  60 62 74  Resp:  20 20 19   Temp:    98.1 F (36.7 C)  TempSrc:      SpO2: 98% 94% 97% 95%  Weight:      Height:        Intake/Output Summary (Last 24 hours) at 04/21/2021 1527 Last data filed at 04/21/2021 0700 Gross per 24 hour  Intake --  Output 4502 ml  Net -4502 ml    Filed Weights   04/17/21 0831 04/18/21 0500 04/19/21 0541  Weight: 63.5 kg 65.2 kg 68.1 kg    Examination: General exam: Awake, laying in bed, in nad Respiratory system: Normal respiratory effort, no wheezing Cardiovascular system: regular rate, s1, s2 Gastrointestinal system: Soft, nondistended, positive BS Central nervous system: CN2-12 grossly intact, strength intact Extremities: Perfused, no clubbing Skin: Normal skin turgor, no notable skin lesions seen Psychiatry: Mood normal // no visual hallucinations   Data Reviewed: I have personally reviewed following labs and imaging  studies  CBC: Recent Labs  Lab 04/17/21 0844 04/17/21 0848 04/17/21 1613 04/18/21 0239 04/18/21 0414 04/20/21 0212 04/20/21 1614 04/21/21 0511  WBC 11.6*  --  7.1 12.7*  --  9.9 8.6 7.4  NEUTROABS 10.2*  --   --   --   --   --   --   --   HGB 11.6*   < > 10.6* 10.0* 11.6* 10.0* 9.9* 9.9*  HCT 38.1*   < > 35.2* 32.9* 34.0* 33.4* 33.4* 33.8*  MCV 91.4  --  90.7 89.9  --  91.8 93.0 92.3  PLT 416*  --  364 359  --  342 352 363   < > = values in this interval not displayed.    Basic Metabolic Panel: Recent Labs  Lab 04/17/21 1613 04/18/21 0239 04/18/21 0414 04/18/21 2013 04/19/21 0310 04/20/21 0212 04/20/21 1103 04/21/21 0511  NA 130* 132* 131* 133* 134* 133*  --  134*  K 4.3 4.4 4.4 4.7 4.8 5.3*  --  4.8  CL 88* 95*  --  94* 93* 93*  --  88*  CO2 32 29  --  31 35* 35*  --  41*  GLUCOSE 119* 135*  --  109* 102* 95  --  95  BUN 8 10  --  11 13 9   --  8  CREATININE 0.81 0.81  --  0.94 0.96 0.68  --  0.75  CALCIUM 8.1* 8.2*  --  8.2* 8.1* 7.9*  --  8.4*  MG 1.7 1.8  --  2.2 2.1  --  1.9  --   PHOS 3.8 3.3  --   --   --   --   --   --     GFR: Estimated Creatinine Clearance: 76.8 mL/min (by C-G formula based on SCr of 0.75 mg/dL). Liver Function Tests: Recent Labs  Lab 04/17/21 0844 04/17/21 1613 04/20/21 0212 04/21/21 0511  AST 12* 14* 10* 10*  ALT 8 8 8 8   ALKPHOS 104 97 75 71  BILITOT 0.2* 0.4 0.6 0.7  PROT 6.5 5.7* 5.3* 5.6*  ALBUMIN 2.7* 2.4* 2.2* 2.3*    No results for input(s): LIPASE, AMYLASE in the last 168 hours. Recent Labs  Lab 04/20/21 1103  AMMONIA 44*    Coagulation Profile: Recent Labs  Lab 04/17/21 0844 04/17/21 1613  INR 1.0 1.0    Cardiac Enzymes: No results for input(s): CKTOTAL, CKMB, CKMBINDEX, TROPONINI in the last 168 hours. BNP (last 3 results) No results for input(s): PROBNP in the last 8760 hours. HbA1C: No results for input(s): HGBA1C in the last 72 hours. CBG: Recent Labs  Lab 04/17/21 0840  GLUCAP 150*     Lipid Profile: No results for input(s): CHOL, HDL, LDLCALC, TRIG, CHOLHDL, LDLDIRECT in the last 72 hours. Thyroid Function Tests: No results for input(s): TSH, T4TOTAL, FREET4, T3FREE, THYROIDAB in the last 72  hours. Anemia Panel: No results for input(s): VITAMINB12, FOLATE, FERRITIN, TIBC, IRON, RETICCTPCT in the last 72 hours. Sepsis Labs: Recent Labs  Lab 04/17/21 0845 04/17/21 1049 04/17/21 1613  PROCALCITON  --   --  <0.10  LATICACIDVEN 1.8 1.6  --      Recent Results (from the past 240 hour(s))  Blood Culture (routine x 2)     Status: None (Preliminary result)   Collection Time: 04/17/21  8:30 AM   Specimen: BLOOD RIGHT ARM  Result Value Ref Range Status   Specimen Description BLOOD RIGHT ARM  Final   Special Requests   Final    BOTTLES DRAWN AEROBIC AND ANAEROBIC Blood Culture adequate volume   Culture   Final    NO GROWTH 3 DAYS Performed at [facility], [address], [place], [place] [address]    Report Status PENDING  Incomplete  Resp Panel by RT-PCR (Flu A&B, Covid) Nasopharyngeal Swab     Status: None   Collection Time: 04/17/21  8:34 AM   Specimen: Nasopharyngeal Swab; Nasopharyngeal(NP) swabs in vial transport medium  Result Value Ref Range Status   SARS Coronavirus 2 by RT PCR NEGATIVE NEGATIVE Final    Comment: (NOTE) SARS-CoV-2 target nucleic acids are NOT DETECTED.  The SARS-CoV-2 RNA is generally detectable in upper respiratory specimens during the acute phase of infection. The lowest concentration of SARS-CoV-2 viral copies this assay can detect is 138 copies/mL. A negative result does not preclude SARS-Cov-2 infection and should not be used as the sole basis for treatment or other patient management decisions. A negative result may occur with  improper specimen collection/handling, submission of specimen other than nasopharyngeal swab, presence of viral mutation(s) within the areas targeted by this assay, and inadequate number of  viral copies(<138 copies/mL). A negative result must be combined with clinical observations, patient history, and epidemiological information. The expected result is Negative.  Fact Sheet for Patients:  EntrepreneurPulse.com.au  Fact Sheet for Healthcare Providers:  IncredibleEmployment.be  This test is no t yet approved or cleared by the [place] FDA and  has been authorized for detection and/or diagnosis of SARS-CoV-2 by FDA under an Emergency Use Authorization (EUA). This EUA will remain  in effect (meaning this test can be used) for the duration of the COVID-19 declaration under Section 564(b)(1) of the Act, 21 U.S.C.section 360bbb-3(b)(1), unless the authorization is terminated  or revoked sooner.       Influenza A by PCR NEGATIVE NEGATIVE Final   Influenza B by PCR NEGATIVE NEGATIVE Final    Comment: (NOTE) The Xpert Xpress SARS-CoV-2/FLU/RSV plus assay is intended as an aid in the diagnosis of influenza from Nasopharyngeal swab specimens and should not be used as a sole basis for treatment. Nasal washings and aspirates are unacceptable for Xpert Xpress SARS-CoV-2/FLU/RSV testing.  Fact Sheet for Patients: EntrepreneurPulse.com.au  Fact Sheet for Healthcare Providers: IncredibleEmployment.be  This test is not yet approved or cleared by the [place] FDA and has been authorized for detection and/or diagnosis of SARS-CoV-2 by FDA under an Emergency Use Authorization (EUA). This EUA will remain in effect (meaning this test can be used) for the duration of the COVID-19 declaration under Section 564(b)(1) of the Act, 21 U.S.C. section 360bbb-3(b)(1), unless the authorization is terminated or revoked.  Performed at [facility], [place] [address] [place], [place] [address]   Blood Culture (routine x 2)     Status: None (Preliminary result)   Collection Time: 04/17/21  8:46  AM    Specimen: BLOOD LEFT ARM  Result Value Ref Range Status   Specimen Description BLOOD LEFT ARM  Final   Special Requests   Final    BOTTLES DRAWN AEROBIC AND ANAEROBIC Blood Culture results may not be optimal due to an inadequate volume of blood received in culture bottles   Culture   Final    NO GROWTH 3 DAYS Performed at [facility], [place] [address], [place], [place] [address]    Report Status PENDING  Incomplete  MRSA Next Gen by PCR, Nasal     Status: None   Collection Time: 04/17/21  3:13 PM  Result Value Ref Range Status   MRSA by PCR Next Gen NOT DETECTED NOT DETECTED Final    Comment: (NOTE) The GeneXpert MRSA Assay (FDA approved for NASAL specimens only), is one component of a comprehensive MRSA colonization surveillance program. It is not intended to diagnose MRSA infection nor to guide or monitor treatment for MRSA infections. Test performance is not FDA approved in patients less than 72 years old. Performed at [facility], [place] [address] [place], [place] [address]   Culture, blood (routine x 2)     Status: None (Preliminary result)   Collection Time: 04/17/21  4:05 PM   Specimen: BLOOD  Result Value Ref Range Status   Specimen Description BLOOD LEFT ANTECUBITAL  Final   Special Requests   Final    BOTTLES DRAWN AEROBIC ONLY Blood Culture results may not be optimal due to an inadequate volume of blood received in culture bottles   Culture   Final    NO GROWTH 3 DAYS Performed at [facility], [place] [address] [place], [place] [address]    Report Status PENDING  Incomplete  Culture, blood (routine x 2)     Status: None (Preliminary result)   Collection Time: 04/17/21  4:15 PM   Specimen: BLOOD RIGHT HAND  Result Value Ref Range Status   Specimen Description BLOOD RIGHT HAND  Final   Special Requests   Final    BOTTLES DRAWN AEROBIC ONLY Blood Culture adequate volume   Culture   Final    NO GROWTH 3 DAYS Performed at [facility], [place] [address] [place], [place] [address]    Report Status PENDING  Incomplete  Urine culture     Status: Abnormal   Collection Time: 04/17/21  6:33 PM   Specimen: In/Out Cath Urine  Result Value Ref Range Status   Specimen Description IN/OUT CATH URINE  Final   Special Requests   Final    NONE Performed at [facility], [place] [address], [place], [place] [address]    Culture MULTIPLE SPECIES PRESENT, SUGGEST RECOLLECTION (A)  Final   Report Status 04/18/2021 FINAL  Final      Radiology Studies: DG CHEST PORT 1 VIEW  Result Date: 04/20/2021 CLINICAL DATA:  Shortness of breath EXAM: PORTABLE CHEST 1 VIEW COMPARISON:  April 17, 2021 FINDINGS: There is ill-defined airspace opacity in the right lower lobe, increased from recent study. There is apparent volume loss in the right apex region. Elsewhere there is mild generalized interstitial thickening. Heart is upper normal in size with pulmonary vascularity normal. No adenopathy. There is aortic atherosclerosis. No appreciable bone lesions. IMPRESSION: 1. Ill-defined opacity right lower lobe, increased from recent study and felt to represent pneumonia. 2. Volume loss in the apical segment right upper lobe. Question second focus of pneumonia. An obstructing  endobronchial lesion could present in this manner. Correlation with chest CT to further evaluate the right apex region is felt to be warranted. 3. Underlying interstitial thickening, likely representing a degree of chronic bronchitis. 4.  Heart upper normal in size. 5.  Aortic Atherosclerosis (ICD10-I70.0). Electronically Signed   By: Lowella Grip III M.D.   On: 04/20/2021 12:38    Scheduled Meds:  arformoterol  15 mcg Nebulization BID   ascorbic acid  500 mg Oral Daily   atorvastatin  40 mg Oral QPM   budesonide (PULMICORT) nebulizer solution  0.5 mg Nebulization BID   Chlorhexidine Gluconate Cloth  6 each Topical Daily   cholecalciferol  2,000 Units Oral Daily   enzalutamide  160 mg Oral  Daily   metoprolol tartrate  12.5 mg Oral BID   pantoprazole (PROTONIX) IV  40 mg Intravenous QHS   predniSONE  40 mg Oral Q breakfast   revefenacin  175 mcg Nebulization Daily   Continuous Infusions:  sodium chloride Stopped (04/17/21 1351)     LOS: 4 days   Marylu Lund, MD [facility] Pager On Amion  If 7PM-7AM, please contact night-coverage 04/21/2021, 3:27 PM

## 2021-04-21 NOTE — Care Management (Signed)
Requested copay info for Edoxaban and  Dabigatran This will result Monday.  Coverage- VA, Medicare A/B, AARP  [facility] listed as pharmacy of choice, would need to clarify if he gets his meds at the [place].  PCP- [facility]

## 2021-04-21 NOTE — Progress Notes (Signed)
Entered patient room for bedside report. Patient has removed bipap and refusing to allow bipap to be reapplied. Patient also refusing to wear pulse oximeter until he can eat. Patient placed on 2L [place]. Patient is alert and oriented x 4.

## 2021-04-21 NOTE — Progress Notes (Signed)
RT NOTES:  Notified Dr Wyline Copas of critical CO2. No further orders at this time. Pt alert and oriented and not wanting to go back on bipap at this time. Will continue to monitor.

## 2021-04-21 NOTE — Progress Notes (Signed)
Patient has been able to tolerate intermittent bipap with 1 hour breaks for meals.

## 2021-04-22 LAB — CULTURE, BLOOD (ROUTINE X 2)
Culture: NO GROWTH
Culture: NO GROWTH
Culture: NO GROWTH
Culture: NO GROWTH
Special Requests: ADEQUATE
Special Requests: ADEQUATE

## 2021-04-22 LAB — COMPREHENSIVE METABOLIC PANEL
ALT: 9 U/L (ref 0–44)
AST: 11 U/L — ABNORMAL LOW (ref 15–41)
Albumin: 2.2 g/dL — ABNORMAL LOW (ref 3.5–5.0)
Alkaline Phosphatase: 68 U/L (ref 38–126)
Anion gap: 4 — ABNORMAL LOW (ref 5–15)
BUN: 10 mg/dL (ref 8–23)
CO2: 42 mmol/L — ABNORMAL HIGH (ref 22–32)
Calcium: 8.5 mg/dL — ABNORMAL LOW (ref 8.9–10.3)
Chloride: 89 mmol/L — ABNORMAL LOW (ref 98–111)
Creatinine, Ser: 1 mg/dL (ref 0.61–1.24)
GFR, Estimated: >60 mL/min (ref >60)
Glucose, Bld: 124 mg/dL — ABNORMAL HIGH (ref 70–99)
Potassium: 4.6 mmol/L (ref 3.5–5.1)
Sodium: 135 mmol/L (ref 135–145)
Total Bilirubin: 0.5 mg/dL (ref 0.3–1.2)
Total Protein: 5.4 g/dL — ABNORMAL LOW (ref 6.5–8.1)

## 2021-04-22 LAB — CBC
HCT: 33.7 % — ABNORMAL LOW (ref 39.0–52.0)
Hemoglobin: 10.3 g/dL — ABNORMAL LOW (ref 13.0–17.0)
MCH: 27.8 pg (ref 26.0–34.0)
MCHC: 30.6 g/dL (ref 30.0–36.0)
MCV: 90.8 fL (ref 80.0–100.0)
Platelets: 371 10*3/uL (ref 150–400)
RBC: 3.71 MIL/uL — ABNORMAL LOW (ref 4.22–5.81)
RDW: 17.1 % — ABNORMAL HIGH (ref 11.5–15.5)
WBC: 8.8 10*3/uL (ref 4.0–10.5)
nRBC: 0.2 % (ref 0.0–0.2)

## 2021-04-22 MED ORDER — ASPIRIN EC 81 MG PO TBEC
81.0000 mg | DELAYED_RELEASE_TABLET | Freq: Every day | ORAL | Status: DC
  Administered 2021-04-22 – 2021-04-27 (×6): 81 mg via ORAL
  Filled 2021-04-22 (×6): qty 1

## 2021-04-22 MED ORDER — PANTOPRAZOLE SODIUM 40 MG PO TBEC
40.0000 mg | DELAYED_RELEASE_TABLET | Freq: Every day | ORAL | Status: DC
  Administered 2021-04-22 – 2021-04-26 (×5): 40 mg via ORAL
  Filled 2021-04-22 (×5): qty 1

## 2021-04-22 NOTE — Progress Notes (Signed)
PROGRESS NOTE    Daniel King  KLK:[id number] DOB: July 30, 1946 DOA: 04/17/2021 PCP: Henreitta Cea, MD    Brief Narrative:  75yo with hx copd, prostate caner, CVA within the past year, hx ETOH abuse who presented to the ED with worsening sob despite inhalers, ultimately admitted to ICU service, requiring BiPAP, now transferred to [place]:   Active Problems:   COPD exacerbation ([place])   Respiratory failure (HCC)   Pressure injury of skin  COPD exacerbation PCCM had been following Cont on neb tx and steroids Serial abg's reviewed. Findings consistent with continued co2 retention and acidosis Pt is continuing on bipap as needed Afib RVR, new diagnosis Recent evidence of afib on ekg Currently rate controlled Continued on scheduled metoprolol See below, now with hematuria. Lovenox now on hold Urine has since cleared. Given hematuria, discussed with family with plan to resume asa today, anticipate add plavix again afterwards as pt tolerated DAPT without issues Would have pt f/u as outpatient to address anticoagulation in the future Hx CVA Per family, was diagnosed with CVA around 8/21 Had been on ASA and plavix Given new afib, have transitioned to full therapeutic anticoagulation after discussion with family, who agrees Recommend close f/u with Neurology when ps is discharged Hx prostate cancer Noted to have hematuria today while on therapeutic anticoagulation Hold further lovenox Continue to follow CBC, stable Acute toxic metabolic encephalopathy Tired appearing this AM Cont to treat copd as per above Hx ETOH abuse On librium currently Most recent CIWA noted to be 0 Hyponatremia Noted to be improving Repeat  bmet in AM Acquired thrombophilia Secondary to above afib Lovenox now on hold secondary to hematuria Repeat CBC in AM  DVT prophylaxis: scd's Code Status: Full Family Communication: Pt in room, family at bedside  Status is:  Inpatient  Remains inpatient appropriate because:Inpatient level of care appropriate due to severity of illness  Dispo: The patient is from: Home              Anticipated d/c is to: Home              Patient currently is not medically stable to d/c.   Difficult to place patient No   Consultants:  PCCM  Procedures:    Antimicrobials: Anti-infectives (From admission, onward)    Start     Dose/Rate Route Frequency Ordered Stop   04/18/21 1000  azithromycin (ZITHROMAX) tablet 250 mg        250 mg Oral Daily 04/18/21 0725 04/21/21 0833   04/17/21 0845  cefTRIAXone (ROCEPHIN) 2 g in sodium chloride 0.9 % 100 mL IVPB  Status:  Discontinued        2 g 200 mL/hr over 30 Minutes Intravenous Every 24 hours 04/17/21 0834 04/18/21 0725   04/17/21 0845  azithromycin (ZITHROMAX) 500 mg in sodium chloride 0.9 % 250 mL IVPB  Status:  Discontinued        500 mg 250 mL/hr over 60 Minutes Intravenous Every 24 hours 04/17/21 0834 04/18/21 0725       Subjective: Without complaints  Objective: Vitals:   04/22/21 0736 04/22/21 0737 04/22/21 0943 04/22/21 1424  BP:    135/68  Pulse:   62 71  Resp:   20 20  Temp:    97.8 F (36.6 C)  TempSrc:      SpO2: 99% 99% 98% 98%  Weight:      Height:        Intake/Output Summary (Last  24 hours) at 04/22/2021 1439 Last data filed at 04/22/2021 0800 Gross per 24 hour  Intake 240 ml  Output 1350 ml  Net -1110 ml    Filed Weights   04/17/21 0831 04/18/21 0500 04/19/21 0541  Weight: 63.5 kg 65.2 kg 68.1 kg    Examination: General exam: Conversant, in no acute distress Respiratory system: normal chest rise, clear, no audible wheezing Cardiovascular system: regular rhythm, s1-s2 Gastrointestinal system: Nondistended, nontender, pos BS Central nervous system: No seizures, no tremors Extremities: No cyanosis, no joint deformities Skin: No rashes, no pallor Psychiatry: Affect normal // no auditory hallucinations   Data Reviewed: I have  personally reviewed following labs and imaging studies  CBC: Recent Labs  Lab 04/17/21 0844 04/17/21 0848 04/18/21 0239 04/18/21 0414 04/20/21 0212 04/20/21 1614 04/21/21 0511 04/22/21 0200  WBC 11.6*   < > 12.7*  --  9.9 8.6 7.4 8.8  NEUTROABS 10.2*  --   --   --   --   --   --   --   HGB 11.6*   < > 10.0* 11.6* 10.0* 9.9* 9.9* 10.3*  HCT 38.1*   < > 32.9* 34.0* 33.4* 33.4* 33.8* 33.7*  MCV 91.4   < > 89.9  --  91.8 93.0 92.3 90.8  PLT 416*   < > 359  --  342 352 363 371   < > = values in this interval not displayed.    Basic Metabolic Panel: Recent Labs  Lab 04/17/21 1613 04/18/21 0239 04/18/21 0414 04/18/21 2013 04/19/21 0310 04/20/21 0212 04/20/21 1103 04/21/21 0511 04/22/21 0200  NA 130* 132*   < > 133* 134* 133*  --  134* 135  K 4.3 4.4   < > 4.7 4.8 5.3*  --  4.8 4.6  CL 88* 95*  --  94* 93* 93*  --  88* 89*  CO2 32 29  --  31 35* 35*  --  41* 42*  GLUCOSE 119* 135*  --  109* 102* 95  --  95 124*  BUN 8 10  --  11 13 9   --  8 10  CREATININE 0.81 0.81  --  0.94 0.96 0.68  --  0.75 1.00  CALCIUM 8.1* 8.2*  --  8.2* 8.1* 7.9*  --  8.4* 8.5*  MG 1.7 1.8  --  2.2 2.1  --  1.9  --   --   PHOS 3.8 3.3  --   --   --   --   --   --   --    < > = values in this interval not displayed.    GFR: Estimated Creatinine Clearance: 61.5 mL/min (by C-G formula based on SCr of 1 mg/dL). Liver Function Tests: Recent Labs  Lab 04/17/21 0844 04/17/21 1613 04/20/21 0212 04/21/21 0511 04/22/21 0200  AST 12* 14* 10* 10* 11*  ALT 8 8 8 8 9   ALKPHOS 104 97 75 71 68  BILITOT 0.2* 0.4 0.6 0.7 0.5  PROT 6.5 5.7* 5.3* 5.6* 5.4*  ALBUMIN 2.7* 2.4* 2.2* 2.3* 2.2*    No results for input(s): LIPASE, AMYLASE in the last 168 hours. Recent Labs  Lab 04/20/21 1103  AMMONIA 44*    Coagulation Profile: Recent Labs  Lab 04/17/21 0844 04/17/21 1613  INR 1.0 1.0    Cardiac Enzymes: No results for input(s): CKTOTAL, CKMB, CKMBINDEX, TROPONINI in the last 168 hours. BNP  (last 3 results) No results for input(s): PROBNP in the last 8760  hours. HbA1C: No results for input(s): HGBA1C in the last 72 hours. CBG: Recent Labs  Lab 04/17/21 0840  GLUCAP 150*    Lipid Profile: No results for input(s): CHOL, HDL, LDLCALC, TRIG, CHOLHDL, LDLDIRECT in the last 72 hours. Thyroid Function Tests: No results for input(s): TSH, T4TOTAL, FREET4, T3FREE, THYROIDAB in the last 72 hours. Anemia Panel: No results for input(s): VITAMINB12, FOLATE, FERRITIN, TIBC, IRON, RETICCTPCT in the last 72 hours. Sepsis Labs: Recent Labs  Lab 04/17/21 0845 04/17/21 1049 04/17/21 1613  PROCALCITON  --   --  <0.10  LATICACIDVEN 1.8 1.6  --      Recent Results (from the past 240 hour(s))  Blood Culture (routine x 2)     Status: None   Collection Time: 04/17/21  8:30 AM   Specimen: BLOOD RIGHT ARM  Result Value Ref Range Status   Specimen Description BLOOD RIGHT ARM  Final   Special Requests   Final    BOTTLES DRAWN AEROBIC AND ANAEROBIC Blood Culture adequate volume   Culture   Final    NO GROWTH 5 DAYS Performed at [facility], [address], [place], [place] [address]    Report Status 04/22/2021 FINAL  Final  Resp Panel by RT-PCR (Flu A&B, Covid) Nasopharyngeal Swab     Status: None   Collection Time: 04/17/21  8:34 AM   Specimen: Nasopharyngeal Swab; Nasopharyngeal(NP) swabs in vial transport medium  Result Value Ref Range Status   SARS Coronavirus 2 by RT PCR NEGATIVE NEGATIVE Final    Comment: (NOTE) SARS-CoV-2 target nucleic acids are NOT DETECTED.  The SARS-CoV-2 RNA is generally detectable in upper respiratory specimens during the acute phase of infection. The lowest concentration of SARS-CoV-2 viral copies this assay can detect is 138 copies/mL. A negative result does not preclude SARS-Cov-2 infection and should not be used as the sole basis for treatment or other patient management decisions. A negative result may occur with  improper specimen  collection/handling, submission of specimen other than nasopharyngeal swab, presence of viral mutation(s) within the areas targeted by this assay, and inadequate number of viral copies(<138 copies/mL). A negative result must be combined with clinical observations, patient history, and epidemiological information. The expected result is Negative.  Fact Sheet for Patients:  EntrepreneurPulse.com.au  Fact Sheet for Healthcare Providers:  IncredibleEmployment.be  This test is no t yet approved or cleared by the [place] FDA and  has been authorized for detection and/or diagnosis of SARS-CoV-2 by FDA under an Emergency Use Authorization (EUA). This EUA will remain  in effect (meaning this test can be used) for the duration of the COVID-19 declaration under Section 564(b)(1) of the Act, 21 U.S.C.section 360bbb-3(b)(1), unless the authorization is terminated  or revoked sooner.       Influenza A by PCR NEGATIVE NEGATIVE Final   Influenza B by PCR NEGATIVE NEGATIVE Final    Comment: (NOTE) The Xpert Xpress SARS-CoV-2/FLU/RSV plus assay is intended as an aid in the diagnosis of influenza from Nasopharyngeal swab specimens and should not be used as a sole basis for treatment. Nasal washings and aspirates are unacceptable for Xpert Xpress SARS-CoV-2/FLU/RSV testing.  Fact Sheet for Patients: EntrepreneurPulse.com.au  Fact Sheet for Healthcare Providers: IncredibleEmployment.be  This test is not yet approved or cleared by the [place] FDA and has been authorized for detection and/or diagnosis of SARS-CoV-2 by FDA under an Emergency Use Authorization (EUA). This EUA will remain in effect (meaning this test can be used) for the duration  of the COVID-19 declaration under Section 564(b)(1) of the Act, 21 U.S.C. section 360bbb-3(b)(1), unless the authorization is terminated or revoked.  Performed at [facility], [place] [address], [place], [place] [address]   Blood Culture (routine x 2)     Status: None   Collection Time: 04/17/21  8:46 AM   Specimen: BLOOD LEFT ARM  Result Value Ref Range Status   Specimen Description BLOOD LEFT ARM  Final   Special Requests   Final    BOTTLES DRAWN AEROBIC AND ANAEROBIC Blood Culture results may not be optimal due to an inadequate volume of blood received in culture bottles   Culture   Final    NO GROWTH 5 DAYS Performed at [facility], [place] [address] [place], [place] [address]    Report Status 04/22/2021 FINAL  Final  MRSA Next Gen by PCR, Nasal     Status: None   Collection Time: 04/17/21  3:13 PM  Result Value Ref Range Status   MRSA by PCR Next Gen NOT DETECTED NOT DETECTED Final    Comment: (NOTE) The GeneXpert MRSA Assay (FDA approved for NASAL specimens only), is one component of a comprehensive MRSA colonization surveillance program. It is not intended to diagnose MRSA infection nor to guide or monitor treatment for MRSA infections. Test performance is not FDA approved in patients less than 48 years old. Performed at [facility], [place] [address] [place], [place] [address]   Culture, blood (routine x 2)     Status: None   Collection Time: 04/17/21  4:05 PM   Specimen: BLOOD  Result Value Ref Range Status   Specimen Description BLOOD LEFT ANTECUBITAL  Final   Special Requests   Final    BOTTLES DRAWN AEROBIC ONLY Blood Culture results may not be optimal due to an inadequate volume of blood received in culture bottles   Culture   Final    NO GROWTH 5 DAYS Performed at [facility], [place] [address] [place], [place] [address]    Report Status 04/22/2021 FINAL  Final  Culture, blood (routine x 2)     Status: None   Collection Time: 04/17/21  4:15 PM   Specimen: BLOOD RIGHT HAND  Result Value Ref Range Status   Specimen Description BLOOD RIGHT HAND  Final   Special Requests   Final    BOTTLES DRAWN AEROBIC ONLY  Blood Culture adequate volume   Culture   Final    NO GROWTH 5 DAYS Performed at [facility], [place] [address] [place], [place] [address]    Report Status 04/22/2021 FINAL  Final  Urine culture     Status: Abnormal   Collection Time: 04/17/21  6:33 PM   Specimen: In/Out Cath Urine  Result Value Ref Range Status   Specimen Description IN/OUT CATH URINE  Final   Special Requests   Final    NONE Performed at [facility], [place] [address], [place], [place] [address]    Culture MULTIPLE SPECIES PRESENT, SUGGEST RECOLLECTION (A)  Final   Report Status 04/18/2021 FINAL  Final      Radiology Studies: No results found.  Scheduled Meds:  arformoterol  15 mcg Nebulization BID   ascorbic acid  500 mg Oral Daily   aspirin EC  81 mg Oral Daily   atorvastatin  40 mg Oral QPM   budesonide (PULMICORT) nebulizer solution  0.5 mg Nebulization BID   Chlorhexidine Gluconate Cloth  6 each Topical Daily   cholecalciferol  2,000 Units Oral Daily   enzalutamide  160 mg Oral Daily   metoprolol tartrate  12.5 mg Oral BID   pantoprazole  40 mg Oral QHS   predniSONE  40 mg Oral Q breakfast   revefenacin  175 mcg Nebulization Daily   Continuous Infusions:  sodium chloride Stopped (04/17/21 1351)     LOS: 5 days   Marylu Lund, MD [facility] Pager On Amion  If 7PM-7AM, please contact night-coverage 04/22/2021, 2:39 PM

## 2021-04-23 DIAGNOSIS — Z66 Do not resuscitate: Secondary | ICD-10-CM

## 2021-04-23 DIAGNOSIS — J9621 Acute and chronic respiratory failure with hypoxia: Secondary | ICD-10-CM | POA: Diagnosis not present

## 2021-04-23 DIAGNOSIS — Z7189 Other specified counseling: Secondary | ICD-10-CM | POA: Diagnosis not present

## 2021-04-23 DIAGNOSIS — J189 Pneumonia, unspecified organism: Secondary | ICD-10-CM | POA: Diagnosis not present

## 2021-04-23 DIAGNOSIS — J441 Chronic obstructive pulmonary disease with (acute) exacerbation: Secondary | ICD-10-CM | POA: Diagnosis not present

## 2021-04-23 LAB — COMPREHENSIVE METABOLIC PANEL
ALT: 8 U/L (ref 0–44)
AST: 13 U/L — ABNORMAL LOW (ref 15–41)
Albumin: 2.1 g/dL — ABNORMAL LOW (ref 3.5–5.0)
Alkaline Phosphatase: 59 U/L (ref 38–126)
Anion gap: 4 — ABNORMAL LOW (ref 5–15)
BUN: 8 mg/dL (ref 8–23)
CO2: 40 mmol/L — ABNORMAL HIGH (ref 22–32)
Calcium: 8.1 mg/dL — ABNORMAL LOW (ref 8.9–10.3)
Chloride: 90 mmol/L — ABNORMAL LOW (ref 98–111)
Creatinine, Ser: 0.71 mg/dL (ref 0.61–1.24)
GFR, Estimated: >60 mL/min (ref >60)
Glucose, Bld: 134 mg/dL — ABNORMAL HIGH (ref 70–99)
Potassium: 4 mmol/L (ref 3.5–5.1)
Sodium: 134 mmol/L — ABNORMAL LOW (ref 135–145)
Total Bilirubin: 0.7 mg/dL (ref 0.3–1.2)
Total Protein: 5 g/dL — ABNORMAL LOW (ref 6.5–8.1)

## 2021-04-23 LAB — CBC
HCT: 32.5 % — ABNORMAL LOW (ref 39.0–52.0)
Hemoglobin: 9.9 g/dL — ABNORMAL LOW (ref 13.0–17.0)
MCH: 27.2 pg (ref 26.0–34.0)
MCHC: 30.5 g/dL (ref 30.0–36.0)
MCV: 89.3 fL (ref 80.0–100.0)
Platelets: 325 10*3/uL (ref 150–400)
RBC: 3.64 MIL/uL — ABNORMAL LOW (ref 4.22–5.81)
RDW: 16.8 % — ABNORMAL HIGH (ref 11.5–15.5)
WBC: 8.2 10*3/uL (ref 4.0–10.5)
nRBC: 0 % (ref 0.0–0.2)

## 2021-04-23 MED ORDER — CLOPIDOGREL BISULFATE 75 MG PO TABS
75.0000 mg | ORAL_TABLET | Freq: Every day | ORAL | Status: DC
  Administered 2021-04-23 – 2021-04-27 (×5): 75 mg via ORAL
  Filled 2021-04-23 (×5): qty 1

## 2021-04-23 NOTE — TOC Progression Note (Signed)
Transition of Care [facility]) - Progression Note    Patient Details  Name: Daniel King MRN: [id number] Date of Birth: [birth year]  Transition of Care [facility]) CM/SW Contact  Joanne Chars, LCSW Phone Number: 04/23/2021, 2:17 PM  Clinical Narrative:   CSW met with pt and wife regarding SNF vs HH.  Pt again states he will not agree to SNF.  Wife expresses some reservation and pt states he will "find a volunteer" to help if it is too much for wife.  CSW spoke with PT who reports that pt primarily needs minimum assist/steadying during transfers as he has posterior lean.  Wife would need to be present for pt to transfer safely.  CSW returned to room, spoke with wife alone who expresses some reservation even with this information.  She asked for SNF list and is going to discuss further with pt.  She is aware that pt is capable of physically moving around but also said she has questions about managing his O2 and also that he can be a "difficult patient."  Wife aware that decision needs to be made soon if plan is going to change to SNF.     Expected Discharge Plan: [place] Barriers to Discharge: Continued Medical Work up  Expected Discharge Plan and Services Expected Discharge Plan: [place] In-house Referral: Clinical Social Work   Post Acute Care Choice: Guys arrangements for the past 2 months: Single Family Home                                       Social Determinants of Health (SDOH) Interventions    Readmission Risk Interventions No flowsheet data found.

## 2021-04-23 NOTE — TOC Benefit Eligibility Note (Signed)
Transition of Care [facility]) Benefit Eligibility Note    Patient Details  Name: Daniel King MRN: [id number] Date of Birth: 05/21/1946   Medication/Dose: EDOXABAN   and    DABIGATRAN        Prescription Coverage Preferred Pharmacy: [facility]              ADDITIONAL  : NO PHARMACY  BENEFIT'S ON FILE ! And  PT'S HAS VS BENEFIT'S    Memory Argue Phone Number: 04/23/2021, 11:59 AM

## 2021-04-23 NOTE — Plan of Care (Signed)
Patient refusing bipap despite education

## 2021-04-23 NOTE — Progress Notes (Signed)
Physical Therapy Treatment Patient Details Name: Daniel King MRN: [id number] DOB: [birth year] Today's Date: 04/23/2021    History of Present Illness 75 year old man admitted 6/14  presenting with worsening dyspnea.  EMS found him saturating in 71s. Placed on PAP and brought to [facility] found to be in acute on chronic hypoxemic and hypercarbic respiratory failure.  PMH: COPD followed at [place], Hx of CVA 08/2020, prostate cancer, and ETOH abuse.    PT Comments    Pt admitted with above diagnosis. Pt was able to stand to RW with mod assist for power up and min assist for transfer.  Pt unsteady due to posterior lean and bil knees flexed when he fatigues. Will need wife to provide assist at home. SW updated.  Will follow acutely.  Pt currently with functional limitations due to balance and endurance deficits. Pt will benefit from skilled PT to increase their independence and safety with mobility to allow discharge to the venue listed below.      Follow Up Recommendations  Home health PT;Supervision/Assistance - 24 hour (SNF if wife cant provide 24 hour care)     Equipment Recommendations  None recommended by PT    Recommendations for Other Services       Precautions / Restrictions Precautions Precautions: Fall Precaution Comments: monitor SpO2 Restrictions Weight Bearing Restrictions: No    Mobility  Bed Mobility Overal bed mobility: Needs Assistance Bed Mobility: Supine to Sit     Supine to sit: Min assist     General bed mobility comments: light MIN A to elevate trunk into sitting, cues needed to scoot hips towards EOB prior to attempting to stand    Transfers Overall transfer level: Needs assistance Equipment used: Rolling walker (2 wheeled) Transfers: Sit to/from [facility] Sit to Stand: Min assist;Mod assist Stand pivot transfers: Min assist       General transfer comment: pt initially requried MOD A to power into standing from EOB but progressed to  MIN A as session continued, MIN A to pivot with cues needed for sequencing of task and safety awareness during transfer such as reaching back for recliner.  Pt with significant posterior lean.  Pt with alot of difficulty achieving upright stance and getting balance without assist.  Ambulation/Gait             General Gait Details: NT- pt states he walked very little att home.   Stairs             Wheelchair Mobility    Modified Rankin (Stroke Patients Only)       Balance Overall balance assessment: Needs assistance Sitting-balance support: Feet supported;No upper extremity supported Sitting balance-Leahy Scale: Fair Sitting balance - Comments: close supervision from EOB   Standing balance support: Bilateral upper extremity supported;During functional activity Standing balance-Leahy Scale: Poor Standing balance comment: reliant on BUE support                            Cognition Arousal/Alertness: Awake/alert Behavior During Therapy: Flat affect Overall Cognitive Status: Impaired/Different from baseline Area of Impairment: Memory;Safety/judgement;Problem solving                 Orientation Level: Situation   Memory: Decreased recall of precautions;Decreased short-term memory (repeated cues for safety)   Safety/Judgement: Decreased awareness of safety;Decreased awareness of deficits   Problem Solving: Slow processing;Decreased initiation;Difficulty sequencing;Requires verbal cues;Requires tactile cues General Comments: pt requried repeated cues for safety  awareness and hand placement during transfers      Exercises Other Exercises Other Exercises: encouraged pt to work on [place] and seated marching    General Comments General comments (skin integrity, edema, etc.): pt on 2L [place] during session with SpO2 mainly sustaining in 88-87%.      Pertinent Vitals/Pain Pain Assessment: No/denies pain    Home Living                      Prior  Function            PT Goals (current goals can now be found in the care plan section) Acute Rehab PT Goals Patient Stated Goal: to walk more Progress towards PT goals: Progressing toward goals    Frequency    Min 3X/week      PT Plan Current plan remains appropriate    Co-evaluation              AM-PAC PT "6 Clicks" Mobility   Outcome Measure  Help needed turning from your back to your side while in a flat bed without using bedrails?: A Little Help needed moving from lying on your back to sitting on the side of a flat bed without using bedrails?: A Little Help needed moving to and from a bed to a chair (including a wheelchair)?: A Lot Help needed standing up from a chair using your arms (e.g., wheelchair or bedside chair)?: A Lot Help needed to walk in hospital room?: Total Help needed climbing 3-5 steps with a railing? : Total 6 Click Score: 12    End of Session Equipment Utilized During Treatment: Gait belt;Oxygen Activity Tolerance: Patient limited by fatigue Patient left: in bed;with call bell/phone within reach;with bed alarm set Nurse Communication: Mobility status PT Visit Diagnosis: Muscle weakness (generalized) (M62.81)     Time: 5573-2202 PT Time Calculation (min) (ACUTE ONLY): 18 min  Charges:  $Therapeutic Activity: 8-22 mins                     Daniel King,PT Acute Rehab Services [phone] (pager)    Daniel King 04/23/2021, 1:35 PM

## 2021-04-23 NOTE — Progress Notes (Signed)
PROGRESS NOTE    Daniel King  ZOX:[id number] DOB: [birth year] DOA: 04/17/2021 PCP: Henreitta Cea, MD    Brief Narrative:  75yo with hx copd, prostate caner, CVA within the past year, hx ETOH abuse who presented to the ED with worsening sob despite inhalers, ultimately admitted to ICU service, requiring BiPAP, now transferred to [place]:   Active Problems:   COPD exacerbation ([place])   Respiratory failure (H[facility])   Pressure injury of skin  COPD exacerbation PCCM had been following Cont on neb tx and steroids Serial abg's reviewed. Findings consistent with continued co2 retention and acidosis Pt is continuing on bipap as needed but also refusing bipap overnight Afib RVR, new diagnosis Recent evidence of afib on ekg Currently rate controlled Continued on scheduled metoprolol See below, now with hematuria. Lovenox now on hold Urine has since cleared. Given hematuria, discussed with family with plan to resume asa today, anticipate add plavix again afterwards as pt tolerated DAPT without issues Would have pt f/u as outpatient to address anticoagulation in the future Hx CVA Per family, was diagnosed with CVA around 8/21 Had been on ASA and plavix Given new afib, attempted full therapeutic anticoagulation however pt developed gross hematuria Will transition back to ASA and plavix Recommend close f/u with Neurology when ps is discharged Hx prostate cancer Noted to have hematuria today while on therapeutic anticoagulation Hold further lovenox CBC stable Acute toxic metabolic encephalopathy Tired appearing this AM Cont to treat copd as per above Hx ETOH abuse On librium currently Most recent CIWA noted to be 0 Hyponatremia Noted to be improving Repeat  bmet in AM Acquired thrombophilia Secondary to above afib Lovenox now on hold secondary to hematuria Recheck CBC in AM  DVT prophylaxis: scd's Code Status: Full Family Communication: Pt in room,  family at bedside  Status is: Inpatient  Remains inpatient appropriate because:Inpatient level of care appropriate due to severity of illness  Dispo: The patient is from: Home              Anticipated d/c is to: Home              Patient currently is not medically stable to d/c.   Difficult to place patient No   Consultants:  PCCM  Procedures:    Antimicrobials: Anti-infectives (From admission, onward)    Start     Dose/Rate Route Frequency Ordered Stop   04/18/21 1000  azithromycin (ZITHROMAX) tablet 250 mg        250 mg Oral Daily 04/18/21 0725 04/21/21 0833   04/17/21 0845  cefTRIAXone (ROCEPHIN) 2 g in sodium chloride 0.9 % 100 mL IVPB  Status:  Discontinued        2 g 200 mL/hr over 30 Minutes Intravenous Every 24 hours 04/17/21 0834 04/18/21 0725   04/17/21 0845  azithromycin (ZITHROMAX) 500 mg in sodium chloride 0.9 % 250 mL IVPB  Status:  Discontinued        500 mg 250 mL/hr over 60 Minutes Intravenous Every 24 hours 04/17/21 0834 04/18/21 0725       Subjective: No complaints today. Refused bipap overnight  Objective: Vitals:   04/22/21 1933 04/22/21 2100 04/23/21 0500 04/23/21 0805  BP:  (!) 146/68    Pulse:  77  72  Resp:  (!) 21  20  Temp:  97.8 F (36.6 C)    TempSrc:  Oral    SpO2: 91% 94%  95%  Weight:   68.4 kg  Height:        Intake/Output Summary (Last 24 hours) at 04/23/2021 1127 Last data filed at 04/23/2021 1009 Gross per 24 hour  Intake --  Output 625 ml  Net -625 ml    Filed Weights   04/18/21 0500 04/19/21 0541 04/23/21 0500  Weight: 65.2 kg 68.1 kg 68.4 kg    Examination: General exam: Awake, laying in bed, in nad Respiratory system: Normal respiratory effort, no wheezing Cardiovascular system: regular rate, s1, s2 Gastrointestinal system: Soft, nondistended, positive BS Central nervous system: CN2-12 grossly intact, strength intact Extremities: Perfused, no clubbing Skin: Normal skin turgor, no notable skin lesions  seen Psychiatry: Mood normal // no visual hallucinations   Data Reviewed: I have personally reviewed following labs and imaging studies  CBC: Recent Labs  Lab 04/17/21 0844 04/17/21 0848 04/20/21 0212 04/20/21 1614 04/21/21 0511 04/22/21 0200 04/23/21 0453  WBC 11.6*   < > 9.9 8.6 7.4 8.8 8.2  NEUTROABS 10.2*  --   --   --   --   --   --   HGB 11.6*   < > 10.0* 9.9* 9.9* 10.3* 9.9*  HCT 38.1*   < > 33.4* 33.4* 33.8* 33.7* 32.5*  MCV 91.4   < > 91.8 93.0 92.3 90.8 89.3  PLT 416*   < > 342 352 363 371 325   < > = values in this interval not displayed.    Basic Metabolic Panel: Recent Labs  Lab 04/17/21 1613 04/18/21 0239 04/18/21 0414 04/18/21 2013 04/19/21 0310 04/20/21 0212 04/20/21 1103 04/21/21 0511 04/22/21 0200 04/23/21 0453  NA 130* 132*   < > 133* 134* 133*  --  134* 135 134*  K 4.3 4.4   < > 4.7 4.8 5.3*  --  4.8 4.6 4.0  CL 88* 95*  --  94* 93* 93*  --  88* 89* 90*  CO2 32 29  --  31 35* 35*  --  41* 42* 40*  GLUCOSE 119* 135*  --  109* 102* 95  --  95 124* 134*  BUN 8 10  --  11 13 9   --  8 10 8   CREATININE 0.81 0.81  --  0.94 0.96 0.68  --  0.75 1.00 0.71  CALCIUM 8.1* 8.2*  --  8.2* 8.1* 7.9*  --  8.4* 8.5* 8.1*  MG 1.7 1.8  --  2.2 2.1  --  1.9  --   --   --   PHOS 3.8 3.3  --   --   --   --   --   --   --   --    < > = values in this interval not displayed.    GFR: Estimated Creatinine Clearance: 77.2 mL/min (by C-G formula based on SCr of 0.71 mg/dL). Liver Function Tests: Recent Labs  Lab 04/17/21 1613 04/20/21 0212 04/21/21 0511 04/22/21 0200 04/23/21 0453  AST 14* 10* 10* 11* 13*  ALT 8 8 8 9 8   ALKPHOS 97 75 71 68 59  BILITOT 0.4 0.6 0.7 0.5 0.7  PROT 5.7* 5.3* 5.6* 5.4* 5.0*  ALBUMIN 2.4* 2.2* 2.3* 2.2* 2.1*    No results for input(s): LIPASE, AMYLASE in the last 168 hours. Recent Labs  Lab 04/20/21 1103  AMMONIA 44*    Coagulation Profile: Recent Labs  Lab 04/17/21 0844 04/17/21 1613  INR 1.0 1.0    Cardiac  Enzymes: No results for input(s): CKTOTAL, CKMB, CKMBINDEX, TROPONINI in the last 168 hours.  BNP (last 3 results) No results for input(s): PROBNP in the last 8760 hours. HbA1C: No results for input(s): HGBA1C in the last 72 hours. CBG: Recent Labs  Lab 04/17/21 0840  GLUCAP 150*    Lipid Profile: No results for input(s): CHOL, HDL, LDLCALC, TRIG, CHOLHDL, LDLDIRECT in the last 72 hours. Thyroid Function Tests: No results for input(s): TSH, T4TOTAL, FREET4, T3FREE, THYROIDAB in the last 72 hours. Anemia Panel: No results for input(s): VITAMINB12, FOLATE, FERRITIN, TIBC, IRON, RETICCTPCT in the last 72 hours. Sepsis Labs: Recent Labs  Lab 04/17/21 0845 04/17/21 1049 04/17/21 1613  PROCALCITON  --   --  <0.10  LATICACIDVEN 1.8 1.6  --      Recent Results (from the past 240 hour(s))  Blood Culture (routine x 2)     Status: None   Collection Time: 04/17/21  8:30 AM   Specimen: BLOOD RIGHT ARM  Result Value Ref Range Status   Specimen Description BLOOD RIGHT ARM  Final   Special Requests   Final    BOTTLES DRAWN AEROBIC AND ANAEROBIC Blood Culture adequate volume   Culture   Final    NO GROWTH 5 DAYS Performed at [facility], [address], [place], [place] [address]    Report Status 04/22/2021 FINAL  Final  Resp Panel by RT-PCR (Flu A&B, Covid) Nasopharyngeal Swab     Status: None   Collection Time: 04/17/21  8:34 AM   Specimen: Nasopharyngeal Swab; Nasopharyngeal(NP) swabs in vial transport medium  Result Value Ref Range Status   SARS Coronavirus 2 by RT PCR NEGATIVE NEGATIVE Final    Comment: (NOTE) SARS-CoV-2 target nucleic acids are NOT DETECTED.  The SARS-CoV-2 RNA is generally detectable in upper respiratory specimens during the acute phase of infection. The lowest concentration of SARS-CoV-2 viral copies this assay can detect is 138 copies/mL. A negative result does not preclude SARS-Cov-2 infection and should not be used as the sole basis for  treatment or other patient management decisions. A negative result may occur with  improper specimen collection/handling, submission of specimen other than nasopharyngeal swab, presence of viral mutation(s) within the areas targeted by this assay, and inadequate number of viral copies(<138 copies/mL). A negative result must be combined with clinical observations, patient history, and epidemiological information. The expected result is Negative.  Fact Sheet for Patients:  EntrepreneurPulse.com.au  Fact Sheet for Healthcare Providers:  IncredibleEmployment.be  This test is no t yet approved or cleared by the [place] FDA and  has been authorized for detection and/or diagnosis of SARS-CoV-2 by FDA under an Emergency Use Authorization (EUA). This EUA will remain  in effect (meaning this test can be used) for the duration of the COVID-19 declaration under Section 564(b)(1) of the Act, 21 U.S.C.section 360bbb-3(b)(1), unless the authorization is terminated  or revoked sooner.       Influenza A by PCR NEGATIVE NEGATIVE Final   Influenza B by PCR NEGATIVE NEGATIVE Final    Comment: (NOTE) The Xpert Xpress SARS-CoV-2/FLU/RSV plus assay is intended as an aid in the diagnosis of influenza from Nasopharyngeal swab specimens and should not be used as a sole basis for treatment. Nasal washings and aspirates are unacceptable for Xpert Xpress SARS-CoV-2/FLU/RSV testing.  Fact Sheet for Patients: EntrepreneurPulse.com.au  Fact Sheet for Healthcare Providers: IncredibleEmployment.be  This test is not yet approved or cleared by the [place] FDA and has been authorized for detection and/or diagnosis of SARS-CoV-2 by FDA under an Emergency Use Authorization (EUA). This EUA  will remain in effect (meaning this test can be used) for the duration of the COVID-19 declaration under Section 564(b)(1) of the Act, 21  U.S.C. section 360bbb-3(b)(1), unless the authorization is terminated or revoked.  Performed at [facility], [place] [address], [place], [place] [address]   Blood Culture (routine x 2)     Status: None   Collection Time: 04/17/21  8:46 AM   Specimen: BLOOD LEFT ARM  Result Value Ref Range Status   Specimen Description BLOOD LEFT ARM  Final   Special Requests   Final    BOTTLES DRAWN AEROBIC AND ANAEROBIC Blood Culture results may not be optimal due to an inadequate volume of blood received in culture bottles   Culture   Final    NO GROWTH 5 DAYS Performed at [facility], [place] [address] [place], [place] [address]    Report Status 04/22/2021 FINAL  Final  MRSA Next Gen by PCR, Nasal     Status: None   Collection Time: 04/17/21  3:13 PM  Result Value Ref Range Status   MRSA by PCR Next Gen NOT DETECTED NOT DETECTED Final    Comment: (NOTE) The GeneXpert MRSA Assay (FDA approved for NASAL specimens only), is one component of a comprehensive MRSA colonization surveillance program. It is not intended to diagnose MRSA infection nor to guide or monitor treatment for MRSA infections. Test performance is not FDA approved in patients less than 67 years old. Performed at [facility], [place] [address], [place], [place] [address]   Culture, blood (routine x 2)     Status: None   Collection Time: 04/17/21  4:05 PM   Specimen: BLOOD  Result Value Ref Range Status   Specimen Description BLOOD LEFT ANTECUBITAL  Final   Special Requests   Final    BOTTLES DRAWN AEROBIC ONLY Blood Culture results may not be optimal due to an inadequate volume of blood received in culture bottles   Culture   Final    NO GROWTH 5 DAYS Performed at [facility], [place] [address], [place], [place] [address]    Report Status 04/22/2021 FINAL  Final  Culture, blood (routine x 2)     Status: None   Collection Time: 04/17/21  4:15 PM   Specimen: BLOOD RIGHT HAND  Result Value Ref Range Status    Specimen Description BLOOD RIGHT HAND  Final   Special Requests   Final    BOTTLES DRAWN AEROBIC ONLY Blood Culture adequate volume   Culture   Final    NO GROWTH 5 DAYS Performed at [facility], [place] [address] [place], [place] [address]    Report Status 04/22/2021 FINAL  Final  Urine culture     Status: Abnormal   Collection Time: 04/17/21  6:33 PM   Specimen: In/Out Cath Urine  Result Value Ref Range Status   Specimen Description IN/OUT CATH URINE  Final   Special Requests   Final    NONE Performed at [facility], [place] [address] [place], [place] [address]    Culture MULTIPLE SPECIES PRESENT, SUGGEST RECOLLECTION (A)  Final   Report Status 04/18/2021 FINAL  Final      Radiology Studies: No results found.  Scheduled Meds:  arformoterol  15 mcg Nebulization BID   ascorbic acid  500 mg Oral Daily   aspirin EC  81 mg Oral Daily   atorvastatin  40 mg Oral QPM   budesonide (PULMICORT)  nebulizer solution  0.5 mg Nebulization BID   Chlorhexidine Gluconate Cloth  6 each Topical Daily   cholecalciferol  2,000 Units Oral Daily   enzalutamide  160 mg Oral Daily   metoprolol tartrate  12.5 mg Oral BID   pantoprazole  40 mg Oral QHS   predniSONE  40 mg Oral Q breakfast   revefenacin  175 mcg Nebulization Daily   Continuous Infusions:  sodium chloride Stopped (04/17/21 1351)     LOS: 6 days   Marylu Lund, MD [facility] Pager On Amion  If 7PM-7AM, please contact night-coverage 04/23/2021, 11:27 AM

## 2021-04-23 NOTE — Progress Notes (Signed)
Pt refused bipap. RT explained to pt why he needs to wear bipap.  Pt refused a second time. RN notified.

## 2021-04-23 NOTE — Progress Notes (Signed)
NAME:  Daniel King, MRN:  [id number], DOB:  [birth year], LOS: 6 ADMISSION DATE:  04/17/2021, CONSULTATION DATE:  04/17/21 REFERRING MD:  Regenia Skeeter CHIEF COMPLAINT:  Dyspnea   History of Present Illness:  Daniel King is a 75 y.o. male who has a PMH including COPD per report (no PFT's available in our system to confirm or assess severity). He presented to [facility] ED 6/14 with dyspnea x 4-5 days.  He had attempted to use his home inhalers without any response.  EMS was called and upon their arrival, his sats were found to be in the 40s.  He was placed on a NRB with improvement only to 27s.  He was subsequently placed on CPAP and transported to the ED.  In the ED, ABG demonstrated respiratory acidosis.  He was started on BiPAP.  VBG 3 hours later was essentially unchanged.  PCCM subsequently asked to see in consultation.  Pertinent  Medical History:  has Pain in left knee; COPD exacerbation ([place]); Respiratory failure ([place]); and Pressure injury of skin on their problem list. COPD. Prostate cancer EtOH abuse  Significant Hospital Events: Including procedures, antibiotic start and stop dates in addition to other pertinent events   6/14 > admit  Micro Data: Flu 6/14 > neg. SARS CoV2 6/14 > neg.  Antibiotics: Ceftriaxone 6/14 >  Azithromycin 6/15>  Interim History / Subjective:  Refusing bipap because a "preacher-man" was coming out of the mask and and saying religious things to him. Wife at bedside, says he seems perfectly normal except for this.   Objective:  Blood pressure (!) 146/68, pulse 72, temperature 97.8 F (36.6 C), temperature source Oral, resp. rate 20, height 6' (1.829 m), weight 68.4 kg, SpO2 95 %.        Intake/Output Summary (Last 24 hours) at 04/23/2021 1303 Last data filed at 04/23/2021 1009 Gross per 24 hour  Intake --  Output 625 ml  Net -625 ml   Filed Weights   04/18/21 0500 04/19/21 0541 04/23/21 0500  Weight: 65.2 kg 68.1 kg 68.4 kg     Examination: General: chronically ill appearing Neuro: somnolent, falls asleep frequently during conversation. delirious HEENT: mmm Cardiovascular: RRR, no mrg Lungs: siminished bilaterally, no wheezes or crackles Abdomen: scaphoid, soft Musculoskeletal: muscle wasting.    Labs/imaging personally reviewed:  ABGs showing hypercapnia Chest xray 6/17 shows RLL opacity and RUL volume loss - mucus plug vs endobronchial lesion.   Resolved Hospital Problem list:   Assessment & Plan:  Daniel King is a 75 y.o. 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  - wean O2 as able to maintain SpO2 88-90%, avoid sats > 92% - BiPAP qhs and with naps - con't prednisone 40mg  daily -complete 5 day course of azithromycin - Can go home on home Stiolto and albuterol - PCO2 elevated despite BiPAP - This patient requires non-invasive ventilator for acute in chronic respiratory failure and COPD. Without ventilator, there is significant risk of untimely readmission to the hospital and increase on CO2 retention. BIPAP is ineffective in the home setting in reducing CO2 due to the lack of an auto-rate.   Acute metabolic encephalopathy, suspect delirium - continue mobility, PT/OT as tolerated - day/ night orientation, encouraged appropriate sleep hygiene - unfortunately his CO2 retention is going to worsen this  Goals of Care Discussed ongoing goals of  care with the patient's wife. -wife notes that the COPD diagnosis is all new to them. Most of his care has been at the [place] prior to this. Never had PFTs. I suspect his hypercapnia is chronic with resting CO2 in the high 60s. If he doesn't start wearing bipap we need to transition our conversation to consider comfort measures. I think hospice would be appropriate in that setting. Patient is currently full code,  asked wife to consider DNR.   PCCM will continue to follow.  Patient must be more compliant with his BiPAP therapy, unless he is more adherent , aggressive medical modalities of care will be futile, and change in code status should be discussed. Discussed with wife and patient at bedside today   Daniel Geralds, MD 04/23/21 1:03 PM [facility] See Amion for contact details

## 2021-04-23 NOTE — Progress Notes (Signed)
Occupational Therapy Treatment Patient Details Name: Daniel King MRN: [id number] DOB: Jul 17, 1946 Today's Date: 04/23/2021    History of present illness 75 year old man admitted 6/14  presenting with worsening dyspnea.  EMS found him saturating in 90s. Placed on PAP and brought to [facility] found to be in acute on chronic hypoxemic and hypercarbic respiratory failure.  PMH: COPD followed at [place], Hx of CVA 08/2020, prostate cancer, and ETOH abuse.   OT comments  Pt making steady progress towards OT goals this session. Pt continues to present with decreased activity tolerance and generalized deconditioning. Pt currently requires MIN - MOD A for sit<>stand from EOB and MIN A for ADL transfers. Pt requires step by step cues to sequence transfers and requires MAX cues for hand placement and body mechanics during transfers. pt on 2L [place] during session with SpO2 mainly sustaining in 88-87%. Pt would continue to benefit from skilled occupational therapy while admitted and after d/c to address the below listed limitations in order to improve overall functional mobility and facilitate independence with BADL participation. DC plan remains appropriate, will follow acutely per POC.     Follow Up Recommendations  Home health OT;Supervision/Assistance - 24 hour    Equipment Recommendations  3 in 1 bedside commode    Recommendations for Other Services      Precautions / Restrictions Precautions Precautions: Fall Precaution Comments: monitor SpO2 Restrictions Weight Bearing Restrictions: No       Mobility Bed Mobility Overal bed mobility: Needs Assistance Bed Mobility: Supine to Sit     Supine to sit: Min assist     General bed mobility comments: light MIN A to elevate trunk into sitting, cues needed to scoot hips towards EOB prior to attempting to stand    Transfers Overall transfer level: Needs assistance Equipment used: Rolling walker (2 wheeled) Transfers: Sit to/from [employer] Sit to Stand: Min assist;Mod assist Stand pivot transfers: Min assist       General transfer comment: pt initially requried MOD A to power into standing from EOB but progressed to MIN A as session continued, MIN A to pivot with cues needed for sequencing of task and safety awareness during transfer such as reaching back for recliner    Balance Overall balance assessment: Needs assistance Sitting-balance support: Feet supported;No upper extremity supported Sitting balance-Leahy Scale: Fair Sitting balance - Comments: close supervision from EOB   Standing balance support: Bilateral upper extremity supported;During functional activity Standing balance-Leahy Scale: Poor Standing balance comment: reliant on BUE support                           ADL either performed or assessed with clinical judgement   ADL Overall ADL's : Needs assistance/impaired             Lower Body Bathing: Sit to/from stand;Maximal assistance Lower Body Bathing Details (indicate cue type and reason): simulated via posterior pericare     Lower Body Dressing: Maximal assistance;Sit to/from stand Lower Body Dressing Details (indicate cue type and reason): to don socks Toilet Transfer: Minimal assistance;Moderate assistance;RW;BSC;Stand-pivot Toilet Transfer Details (indicate cue type and reason): MOD A to rise from sitting surface; MIN A to pivot with RW Toileting- Clothing Manipulation and Hygiene: Maximal assistance;Sit to/from stand Toileting - Clothing Manipulation Details (indicate cue type and reason): psoterior pericare in standing     Functional mobility during ADLs: Minimal assistance;Moderate assistance;Rolling walker General ADL Comments: pt continues to present with  decreased activity tolerance, impaired ability to care over education and generalized deconditioning     Vision       Perception     Praxis      Cognition Arousal/Alertness: Awake/alert Behavior During  Therapy: Flat affect Overall Cognitive Status: Impaired/Different from baseline Area of Impairment: Memory;Safety/judgement;Problem solving                     Memory: Decreased recall of precautions;Decreased short-term memory (repeated cues for safety)   Safety/Judgement: Decreased awareness of safety;Decreased awareness of deficits   Problem Solving: Slow processing;Decreased initiation;Difficulty sequencing;Requires verbal cues;Requires tactile cues General Comments: pt requried repeated cues for safety awareness and hand placement during transfers        Exercises Other Exercises Other Exercises: encouraged pt to work on [place] and seated marching from recliner   Shoulder Instructions       General Comments pt on 2L [place] during session with SpO2 mainly sustaining in 88-87%. pts wife present during session confirming that she assists with LB ADLs, showering and toileting and pt mostly stays up in w/c during the day. issued pt and wife energy conservation handout, pt declined BUE HEP d/t fatigue, will go over HEP next session    Pertinent Vitals/ Pain       Pain Assessment: No/denies pain  Home Living                                          Prior Functioning/Environment              Frequency  Min 2X/week        Progress Toward Goals  OT Goals(current goals can now be found in the care plan section)  Progress towards OT goals: Progressing toward goals  Acute Rehab OT Goals Patient Stated Goal: to walk more OT Goal Formulation: With patient Time For Goal Achievement: 05/03/21 Potential to Achieve Goals: Good  Plan Discharge plan remains appropriate;Frequency remains appropriate    Co-evaluation                 AM-PAC OT "6 Clicks" Daily Activity     Outcome Measure   Help from another person eating meals?: None Help from another person taking care of personal grooming?: A Little Help from another person toileting, which  includes using toliet, bedpan, or urinal?: A Lot Help from another person bathing (including washing, rinsing, drying)?: A Lot Help from another person to put on and taking off regular upper body clothing?: A Lot Help from another person to put on and taking off regular lower body clothing?: Total 6 Click Score: 14    End of Session Equipment Utilized During Treatment: Gait belt;Rolling walker;Oxygen;Other (comment) (2L [place])  OT Visit Diagnosis: Unsteadiness on feet (R26.81);Muscle weakness (generalized) (M62.81);Hemiplegia and hemiparesis Hemiplegia - Right/Left: Right Hemiplegia - dominant/non-dominant: Dominant Hemiplegia - caused by: Unspecified   Activity Tolerance Patient tolerated treatment well   Patient Left in chair;with call bell/phone within reach;with family/visitor present   Nurse Communication Mobility status        Time: 9629-5284 OT Time Calculation (min): 39 min  Charges: OT General Charges $OT Visit: 1 Visit OT Treatments $Self Care/Home Management : 38-52 mins  Harley Alto., COTA/L Acute Rehabilitation Services [place]    Daniel King 04/23/2021, 11:53 AM

## 2021-04-24 DIAGNOSIS — J9621 Acute and chronic respiratory failure with hypoxia: Secondary | ICD-10-CM | POA: Diagnosis not present

## 2021-04-24 DIAGNOSIS — R069 Unspecified abnormalities of breathing: Secondary | ICD-10-CM

## 2021-04-24 DIAGNOSIS — J441 Chronic obstructive pulmonary disease with (acute) exacerbation: Secondary | ICD-10-CM | POA: Diagnosis not present

## 2021-04-24 DIAGNOSIS — J9622 Acute and chronic respiratory failure with hypercapnia: Secondary | ICD-10-CM | POA: Diagnosis not present

## 2021-04-24 NOTE — Progress Notes (Signed)
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 Findings remains with continued co2 retention and acidosis BIPAP remains recommended, however pt continues to refuse bipap intermittently Discussed with Pulmonary. Given lack of compliance with bipap, long-term prognosis is poor, thus recommendation for Palliative Care to establish goals of care Cont to wean O2 as tolerated Afib RVR, new diagnosis Recent evidence of afib on ekg Currently rate controlled Continued on scheduled metoprolol Attempted to anticoagulate, however pt developed hematuria Anticoagulation now on hold and pt tolerating home ASA with plavix, without further hematuria Would have pt f/u as outpatient to address anticoagulation in the future Hx CVA Per family, was diagnosed with CVA around 8/21 Had tolerated ASA and plavix prior to admit Given new afib, attempted full therapeutic anticoagulation however pt developed gross hematuria, presumed secondary to hx of prostate cancer Tolerating resumption of ASA and plavix Recommend close f/u with Neurology when ps is discharged Hx prostate cancer Noted to have hematuria with trial of therapeutic anticoagulation Hematuria resolved off anticoagulation Tolerating DAPT Acute toxic metabolic encephalopathy Tired appearing this AM but mentation seems improved since admit Cont to treat copd as per above Hx ETOH abuse On librium currently Most recent CIWA noted to  be 0 Hyponatremia Improved Recheck bmet in AM Acquired thrombophilia Secondary to above afib Lovenox now on hold secondary to hematuria Tolerating DAPT Recheck cbc in AM  DVT prophylaxis: scd's Code Status: Full Family Communication: Pt in room, family at bedside  Status is: Inpatient  Remains inpatient appropriate because:Inpatient level of care appropriate due to severity of illness  Dispo: The patient is from: Home              Anticipated d/c is to: Home              Patient currently is not medically stable to d/c.   Difficult to place patient No   Consultants:  PCCM Palliative Care  Procedures:    Antimicrobials: Anti-infectives (From admission, onward)    Start     Dose/Rate Route Frequency Ordered Stop   04/18/21 1000  azithromycin (ZITHROMAX) tablet 250 mg        250 mg Oral Daily 04/18/21 0725 04/21/21 0833   04/17/21 0845  cefTRIAXone (ROCEPHIN) 2 g in sodium chloride 0.9 % 100 mL IVPB  Status:  Discontinued        2 g 200 mL/hr over 30 Minutes Intravenous Every 24 hours 04/17/21 0834 04/18/21 0725   04/17/21 0845  azithromycin (ZITHROMAX) 500 mg in sodium chloride 0.9 % 250 mL IVPB  Status:  Discontinued        500 mg 250 mL/hr over 60 Minutes Intravenous Every 24 hours 04/17/21 0834 04/18/21 0725       Subjective: No complaints today. Refused bipap overnight  Objective: Vitals:   04/24/21 0856 04/24/21 0907 04/24/21 0913 04/24/21 1416  BP: (!) 121/53   110/60  Pulse: 79  83 70  Resp: 16  (!) 22  16  Temp: 98.7 F (37.1 C)     TempSrc: Oral     SpO2:  100% 97% 99%  Weight:      Height:        Intake/Output Summary (Last 24 hours) at 04/24/2021 1621 Last data filed at 04/24/2021 0430 Gross per 24 hour  Intake --  Output 1400 ml  Net -1400 ml    Filed Weights   04/19/21 0541 04/23/21 0500 04/24/21 0500  Weight: 68.1 kg 68.4 kg 67.3 kg    Examination: General exam: Conversant, in no acute distress Respiratory system: normal chest  rise, clear, decreased breath sounds throughout, bipap currently in place Cardiovascular system: regular rhythm, s1-s2 Gastrointestinal system: Nondistended, nontender, pos BS Central nervous system: No seizures, no tremors Extremities: No cyanosis, no joint deformities Skin: No rashes, no pallor Psychiatry: Affect normal // no auditory hallucinations   Data Reviewed: I have personally reviewed following labs and imaging studies  CBC: Recent Labs  Lab 04/20/21 0212 04/20/21 1614 04/21/21 0511 04/22/21 0200 04/23/21 0453  WBC 9.9 8.6 7.4 8.8 8.2  HGB 10.0* 9.9* 9.9* 10.3* 9.9*  HCT 33.4* 33.4* 33.8* 33.7* 32.5*  MCV 91.8 93.0 92.3 90.8 89.3  PLT 342 352 363 371 786    Basic Metabolic Panel: Recent Labs  Lab 04/18/21 0239 04/18/21 0414 04/18/21 2013 04/19/21 0310 04/20/21 0212 04/20/21 1103 04/21/21 0511 04/22/21 0200 04/23/21 0453  NA 132*   < > 133* 134* 133*  --  134* 135 134*  K 4.4   < > 4.7 4.8 5.3*  --  4.8 4.6 4.0  CL 95*  --  94* 93* 93*  --  88* 89* 90*  CO2 29  --  31 35* 35*  --  41* 42* 40*  GLUCOSE 135*  --  109* 102* 95  --  95 124* 134*  BUN 10  --  11 13 9   --  8 10 8   CREATININE 0.81  --  0.94 0.96 0.68  --  0.75 1.00 0.71  CALCIUM 8.2*  --  8.2* 8.1* 7.9*  --  8.4* 8.5* 8.1*  MG 1.8  --  2.2 2.1  --  1.9  --   --   --   PHOS 3.3  --   --   --   --   --   --   --   --    < > = values in this interval not displayed.    GFR: Estimated Creatinine Clearance: 75.9 mL/min (by C-G formula based on SCr of 0.71 mg/dL). Liver Function Tests: Recent Labs  Lab 04/20/21 0212 04/21/21 0511 04/22/21 0200 04/23/21 0453  AST 10* 10* 11* 13*  ALT 8 8 9 8   ALKPHOS 75 71 68 59  BILITOT 0.6 0.7 0.5 0.7  PROT 5.3* 5.6* 5.4* 5.0*  ALBUMIN 2.2* 2.3* 2.2* 2.1*    No results for input(s): LIPASE, AMYLASE in the last 168 hours. Recent Labs  Lab 04/20/21 1103  AMMONIA 44*    Coagulation Profile: No results for input(s): INR, PROTIME in the last 168  hours.  Cardiac Enzymes: No results for input(s): CKTOTAL, CKMB, CKMBINDEX, TROPONINI in the last 168 hours. BNP (last 3 results) No results for input(s): PROBNP in the last 8760 hours. HbA1C: No results for input(s): HGBA1C in the last 72 hours. CBG: No results for input(s): GLUCAP in the last 168 hours.  Lipid Profile: No results for input(s): CHOL, HDL, LDLCALC, TRIG, CHOLHDL, LDLDIRECT in the last 72  hours. Thyroid Function Tests: No results for input(s): TSH, T4TOTAL, FREET4, T3FREE, THYROIDAB in the last 72 hours. Anemia Panel: No results for input(s): VITAMINB12, FOLATE, FERRITIN, TIBC, IRON, RETICCTPCT in the last 72 hours. Sepsis Labs: No results for input(s): PROCALCITON, LATICACIDVEN in the last 168 hours.   Recent Results (from the past 240 hour(s))  Blood Culture (routine x 2)     Status: None   Collection Time: 04/17/21  8:30 AM   Specimen: BLOOD RIGHT ARM  Result Value Ref Range Status   Specimen Description BLOOD RIGHT ARM  Final   Special Requests   Final    BOTTLES DRAWN AEROBIC AND ANAEROBIC Blood Culture adequate volume   Culture   Final    NO GROWTH 5 DAYS Performed at [facility], [address], [place], [place] [address]    Report Status 04/22/2021 FINAL  Final  Resp Panel by RT-PCR (Flu A&B, Covid) Nasopharyngeal Swab     Status: None   Collection Time: 04/17/21  8:34 AM   Specimen: Nasopharyngeal Swab; Nasopharyngeal(NP) swabs in vial transport medium  Result Value Ref Range Status   SARS Coronavirus 2 by RT PCR NEGATIVE NEGATIVE Final    Comment: (NOTE) SARS-CoV-2 target nucleic acids are NOT DETECTED.  The SARS-CoV-2 RNA is generally detectable in upper respiratory specimens during the acute phase of infection. The lowest concentration of SARS-CoV-2 viral copies this assay can detect is 138 copies/mL. A negative result does not preclude SARS-Cov-2 infection and should not be used as the sole basis for treatment or other patient  management decisions. A negative result may occur with  improper specimen collection/handling, submission of specimen other than nasopharyngeal swab, presence of viral mutation(s) within the areas targeted by this assay, and inadequate number of viral copies(<138 copies/mL). A negative result must be combined with clinical observations, patient history, and epidemiological information. The expected result is Negative.  Fact Sheet for Patients:  EntrepreneurPulse.com.au  Fact Sheet for Healthcare Providers:  IncredibleEmployment.be  This test is no t yet approved or cleared by the [place] FDA and  has been authorized for detection and/or diagnosis of SARS-CoV-2 by FDA under an Emergency Use Authorization (EUA). This EUA will remain  in effect (meaning this test can be used) for the duration of the COVID-19 declaration under Section 564(b)(1) of the Act, 21 U.S.C.section 360bbb-3(b)(1), unless the authorization is terminated  or revoked sooner.       Influenza A by PCR NEGATIVE NEGATIVE Final   Influenza B by PCR NEGATIVE NEGATIVE Final    Comment: (NOTE) The Xpert Xpress SARS-CoV-2/FLU/RSV plus assay is intended as an aid in the diagnosis of influenza from Nasopharyngeal swab specimens and should not be used as a sole basis for treatment. Nasal washings and aspirates are unacceptable for Xpert Xpress SARS-CoV-2/FLU/RSV testing.  Fact Sheet for Patients: EntrepreneurPulse.com.au  Fact Sheet for Healthcare Providers: IncredibleEmployment.be  This test is not yet approved or cleared by the [place] FDA and has been authorized for detection and/or diagnosis of SARS-CoV-2 by FDA under an Emergency Use Authorization (EUA). This EUA will remain in effect (meaning this test can be used) for the duration of the COVID-19 declaration under Section 564(b)(1) of the Act, 21 U.S.C. section 360bbb-3(b)(1),  unless the authorization is terminated or revoked.  Performed at [facility], [place] [address], [place], [place] [address]   Blood Culture (routine x 2)     Status: None   Collection Time: 04/17/21  8:46 AM  Specimen: BLOOD LEFT ARM  Result Value Ref Range Status   Specimen Description BLOOD LEFT ARM  Final   Special Requests   Final    BOTTLES DRAWN AEROBIC AND ANAEROBIC Blood Culture results may not be optimal due to an inadequate volume of blood received in culture bottles   Culture   Final    NO GROWTH 5 DAYS Performed at [facility], [place] [address], [place], [place] [address]    Report Status 04/22/2021 FINAL  Final  MRSA Next Gen by PCR, Nasal     Status: None   Collection Time: 04/17/21  3:13 PM  Result Value Ref Range Status   MRSA by PCR Next Gen NOT DETECTED NOT DETECTED Final    Comment: (NOTE) The GeneXpert MRSA Assay (FDA approved for NASAL specimens only), is one component of a comprehensive MRSA colonization surveillance program. It is not intended to diagnose MRSA infection nor to guide or monitor treatment for MRSA infections. Test performance is not FDA approved in patients less than 29 years old. Performed at [facility], [place] [address] [place], [place] [address]   Culture, blood (routine x 2)     Status: None   Collection Time: 04/17/21  4:05 PM   Specimen: BLOOD  Result Value Ref Range Status   Specimen Description BLOOD LEFT ANTECUBITAL  Final   Special Requests   Final    BOTTLES DRAWN AEROBIC ONLY Blood Culture results may not be optimal due to an inadequate volume of blood received in culture bottles   Culture   Final    NO GROWTH 5 DAYS Performed at [facility], [place] [address] [place], [place] [address]    Report Status 04/22/2021 FINAL  Final  Culture, blood (routine x 2)     Status: None   Collection Time: 04/17/21  4:15 PM   Specimen: BLOOD RIGHT HAND  Result Value Ref Range Status   Specimen Description BLOOD  RIGHT HAND  Final   Special Requests   Final    BOTTLES DRAWN AEROBIC ONLY Blood Culture adequate volume   Culture   Final    NO GROWTH 5 DAYS Performed at [facility], [place] [address], [place], [place] [address]    Report Status 04/22/2021 FINAL  Final  Urine culture     Status: Abnormal   Collection Time: 04/17/21  6:33 PM   Specimen: In/Out Cath Urine  Result Value Ref Range Status   Specimen Description IN/OUT CATH URINE  Final   Special Requests   Final    NONE Performed at [facility], [place] [address] [place], [place] [address]    Culture MULTIPLE SPECIES PRESENT, SUGGEST RECOLLECTION (A)  Final   Report Status 04/18/2021 FINAL  Final      Radiology Studies: No results found.  Scheduled Meds:  arformoterol  15 mcg Nebulization BID   ascorbic acid  500 mg Oral Daily   aspirin EC  81 mg Oral Daily   atorvastatin  40 mg Oral QPM   budesonide (PULMICORT) nebulizer solution  0.5 mg Nebulization BID   Chlorhexidine Gluconate Cloth  6 each Topical Daily   cholecalciferol  2,000 Units Oral Daily   clopidogrel  75 mg Oral Daily   enzalutamide  160 mg Oral Daily   metoprolol tartrate  12.5 mg Oral BID   pantoprazole  40 mg Oral QHS   revefenacin  175 mcg Nebulization Daily   Continuous Infusions:  sodium  chloride Stopped (04/17/21 1351)     LOS: 7 days   Marylu Lund, MD [facility] Pager On Amion  If 7PM-7AM, please contact night-coverage 04/24/2021, 4:21 PM

## 2021-04-24 NOTE — TOC Progression Note (Signed)
Transition of Care [facility]) - Progression Note    Patient Details  Name: Daniel King MRN: [id number] Date of Birth: 01/14/1946  Transition of Care [facility] CM/SW Contact  Joanne Chars, LCSW Phone Number: 04/24/2021, 9:22 AM  Clinical Narrative:   CSW spoke with pt wife Caren Griffins by phone.  She is in agreement now with plan for pt to return home with [facility].  They had HH previously with [facility] and would like to work with them again.  Wife also requesting hospital bed and bedside commode and has questions about home O2/bipap.      Expected Discharge Plan: [place] Barriers to Discharge: Continued Medical Work up  Expected Discharge Plan and Services Expected Discharge Plan: [place] In-house Referral: Clinical Social Work   Post Acute Care Choice: [place] arrangements for the past 2 months: Single Family Home                                       Social Determinants of Health (SDOH) Interventions    Readmission Risk Interventions No flowsheet data found.

## 2021-04-24 NOTE — Progress Notes (Signed)
NAME:  Daniel King, MRN:  [id number], DOB:  [birth year], LOS: 7 ADMISSION DATE:  04/17/2021, CONSULTATION DATE:  04/17/21 REFERRING MD:  Regenia Skeeter CHIEF COMPLAINT:  Dyspnea   History of Present Illness:  Daniel King is a 75 y.o. male who has a PMH including COPD per report (no PFT's available in our system to confirm or assess severity). He presented to [facility] ED 6/14 with dyspnea x 4-5 days.  He had attempted to use his home inhalers without any response.  EMS was called and upon their arrival, his sats were found to be in the 40s.  He was placed on a NRB with improvement only to 18s.  He was subsequently placed on CPAP and transported to the ED.  In the ED, ABG demonstrated respiratory acidosis.  He was started on BiPAP.  VBG 3 hours later was essentially unchanged.  PCCM subsequently asked to see in consultation.  Pertinent  Medical History:  has Pain in left knee; COPD exacerbation ([place]); Respiratory failure ([place]); and Pressure injury of skin on their problem list. COPD. Prostate cancer EtOH abuse  Significant Hospital Events: Including procedures, antibiotic start and stop dates in addition to other pertinent events   6/14 > admit  Micro Data: Flu 6/14 > neg. SARS CoV2 6/14 > neg.  Antibiotics: Ceftriaxone 6/14 >  Azithromycin 6/15>  Interim History / Subjective:  Refused bipap overnight. Wore it this morning. Sitting up at the bedside, more alert and less confused. Eating lunch. Wife at bedside.   Objective:  Blood pressure (!) 121/53, pulse 83, temperature 98.7 F (37.1 C), temperature source Oral, resp. rate (!) 22, height 6' (1.829 m), weight 67.3 kg, SpO2 97 %.    FiO2 (%):  [30 %] 30 %   Intake/Output Summary (Last 24 hours) at 04/24/2021 1226 Last data filed at 04/24/2021 0430 Gross per 24 hour  Intake --  Output 1400 ml  Net -1400 ml   Filed Weights   04/19/21 0541 04/23/21 0500 04/24/21 0500  Weight: 68.1 kg 68.4 kg 67.3 kg     Examination: General: chronically ill appearing Neuro: awake, alert, follows commands, no focal asymmetry HEENT: mmm Cardiovascular: RRR, no mrg Lungs: diminished, on nasal cannula, no wheezes or crackles Abdomen: scaphoid, soft Musculoskeletal: muscle wasting.    Labs/imaging personally reviewed:  Na 134, K 4.0, Cr 0.71 CO2 40  Resolved Hospital Problem list:   Assessment & Plan:  Daniel King is a 75 y.o. 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 - wean O2 as able to maintain SpO2 88-90%, avoid sats > 92% - BiPAP qhs and with naps - finish prednisone today - will d/c today.  - completed 5 day course of azithromycin - Can go home on home Stiolto and albuterol - PCO2 elevated despite BiPAP - This patient requires non-invasive ventilator for acute in chronic respiratory failure and COPD. Without ventilator, there is significant risk of untimely readmission to the hospital and increase on CO2 retention. BIPAP is ineffective in the home setting in reducing CO2 due to the lack of an auto-rate.   Acute metabolic encephalopathy, suspect delirium - continue mobility, PT/OT as tolerated - day/ night orientation, encouraged appropriate sleep hygiene - unfortunately his CO2 retention is going to worsen this  Goals of Care Discussed high likelihood for readmission if he does not wear bipap  at home - with his wife at bedside. I am recommending DNR and do not intubate. Will have palliative care follow up for ongoing goals of care discussion making.    Spero Geralds, MD 04/24/21 12:26 PM [facility] See Amion for contact details

## 2021-04-25 DIAGNOSIS — Z7189 Other specified counseling: Secondary | ICD-10-CM | POA: Diagnosis not present

## 2021-04-25 DIAGNOSIS — J189 Pneumonia, unspecified organism: Secondary | ICD-10-CM | POA: Diagnosis not present

## 2021-04-25 DIAGNOSIS — J441 Chronic obstructive pulmonary disease with (acute) exacerbation: Secondary | ICD-10-CM | POA: Diagnosis not present

## 2021-04-25 DIAGNOSIS — J9621 Acute and chronic respiratory failure with hypoxia: Secondary | ICD-10-CM | POA: Diagnosis not present

## 2021-04-25 LAB — COMPREHENSIVE METABOLIC PANEL
ALT: 10 U/L (ref 0–44)
AST: 11 U/L — ABNORMAL LOW (ref 15–41)
Albumin: 2.2 g/dL — ABNORMAL LOW (ref 3.5–5.0)
Alkaline Phosphatase: 50 U/L (ref 38–126)
Anion gap: 6 (ref 5–15)
BUN: 8 mg/dL (ref 8–23)
CO2: 41 mmol/L — ABNORMAL HIGH (ref 22–32)
Calcium: 8.7 mg/dL — ABNORMAL LOW (ref 8.9–10.3)
Chloride: 88 mmol/L — ABNORMAL LOW (ref 98–111)
Creatinine, Ser: 0.81 mg/dL (ref 0.61–1.24)
GFR, Estimated: >60 mL/min (ref >60)
Glucose, Bld: 87 mg/dL (ref 70–99)
Potassium: 4.7 mmol/L (ref 3.5–5.1)
Sodium: 135 mmol/L (ref 135–145)
Total Bilirubin: 0.3 mg/dL (ref 0.3–1.2)
Total Protein: 5 g/dL — ABNORMAL LOW (ref 6.5–8.1)

## 2021-04-25 LAB — CBC
HCT: 31.7 % — ABNORMAL LOW (ref 39.0–52.0)
Hemoglobin: 9.7 g/dL — ABNORMAL LOW (ref 13.0–17.0)
MCH: 27.2 pg (ref 26.0–34.0)
MCHC: 30.6 g/dL (ref 30.0–36.0)
MCV: 89 fL (ref 80.0–100.0)
Platelets: 337 10*3/uL (ref 150–400)
RBC: 3.56 MIL/uL — ABNORMAL LOW (ref 4.22–5.81)
RDW: 16.9 % — ABNORMAL HIGH (ref 11.5–15.5)
WBC: 9 10*3/uL (ref 4.0–10.5)
nRBC: 0 % (ref 0.0–0.2)

## 2021-04-25 LAB — MAGNESIUM: Magnesium: 2 mg/dL (ref 1.7–2.4)

## 2021-04-25 NOTE — Progress Notes (Signed)
Palliative:  Consult received and chart reviewed. Patient sleeping - no family at bedside. Called and spoke with wife. She requests meeting tomorrow morning 6/23 at 9 am.  Juel Burrow, DNP, [facility] Palliative Medicine Team Team Phone # [phone]  Pager # [phone]  NO CHARGE

## 2021-04-25 NOTE — Progress Notes (Signed)
Physical Therapy Treatment Patient Details Name: Daniel King MRN: [id number] DOB: 1946/01/07 Today's Date: 04/25/2021    History of Present Illness Pt is 75 year old man admitted 6/14  presenting with worsening dyspnea.  EMS found him saturating in 46s. Placed on PAP and brought to [facility] found to be in acute on chronic hypoxemic and hypercarbic respiratory failure.  PMH: COPD followed at [place], Hx of CVA 08/2020, prostate cancer, and ETOH abuse.    PT Comments    Pt making gradual progress.  He mainly uses a w/c at home.  Today, he was able to complete transfers with min guard-min A level.  He did require O2 in order to maintain oxygen saturation   SATURATION QUALIFICATIONS: (This note is used to comply with regulatory documentation for home oxygen)  Patient Saturations on Room Air at Rest = 83%  Patient Saturations on Room Air while Ambulating = Not tested b/c dropped at rest  Patient Saturations on 2 Liters of oxygen while Ambulating = 92%  Please briefly explain why patient needs home oxygen: In order to maintain oxygen saturation >90% at rest and activity.     Follow Up Recommendations  Home health PT;Supervision/Assistance - 24 hour (SNF if wife cannot provide 24 hr support)     Equipment Recommendations  None recommended by PT    Recommendations for Other Services       Precautions / Restrictions Precautions Precautions: Fall Precaution Comments: monitor SpO2    Mobility  Bed Mobility Overal bed mobility: Needs Assistance Bed Mobility: Supine to Sit     Supine to sit: Min guard          Transfers Overall transfer level: Needs assistance Equipment used: 1 person hand held assist Transfers: Sit to/from [facility] Sit to Stand: Min guard;From elevated surface Stand pivot transfers: Min guard       General transfer comment: Performed sit to stand x 3 from mildly elevated bed with min guard.  Min guard to pivot to chair with HHA and  holding armrest.  Required assist for ADLs  Ambulation/Gait Ambulation/Gait assistance: Min guard Gait Distance (Feet): 2 Feet Assistive device: 1 person hand held assist Gait Pattern/deviations: Decreased stride length;Shuffle     General Gait Details: Small steps to chair - reports very little ambulation at home, uses w/c   Stairs             Wheelchair Mobility    Modified Rankin (Stroke Patients Only)       Balance Overall balance assessment: Needs assistance Sitting-balance support: Feet supported;No upper extremity supported Sitting balance-Leahy Scale: Good     Standing balance support: Single extremity supported Standing balance-Leahy Scale: Poor Standing balance comment: reliant on UE support                            Cognition Arousal/Alertness: Awake/alert Behavior During Therapy: Flat affect Overall Cognitive Status: Impaired/Different from baseline Area of Impairment: Memory;Problem solving                     Memory: Decreased short-term memory       Problem Solving: Slow processing;Difficulty sequencing;Requires verbal cues;Requires tactile cues        Exercises      General Comments General comments (skin integrity, edema, etc.): Pt was asleep and had removed O2 at arrival.  His sats were 83% with good pleth.  Awoke pt and had him take deep  breaths and only up to 85%.  Reapplied 2 L O2 with sats up to 92%.  Pt did require cues for pursed lip breathing to maintain sats.      Pertinent Vitals/Pain Pain Assessment: No/denies pain    Home Living                      Prior Function            PT Goals (current goals can now be found in the care plan section) Acute Rehab PT Goals Patient Stated Goal: to walk more PT Goal Formulation: With patient Time For Goal Achievement: 05/02/21 Potential to Achieve Goals: Good Progress towards PT goals: Progressing toward goals    Frequency    Min  3X/week      PT Plan Current plan remains appropriate    Co-evaluation              AM-PAC PT "6 Clicks" Mobility   Outcome Measure  Help needed turning from your back to your side while in a flat bed without using bedrails?: A Little Help needed moving from lying on your back to sitting on the side of a flat bed without using bedrails?: A Little Help needed moving to and from a bed to a chair (including a wheelchair)?: A Little Help needed standing up from a chair using your arms (e.g., wheelchair or bedside chair)?: A Little Help needed to walk in hospital room?: A Lot Help needed climbing 3-5 steps with a railing? : Total 6 Click Score: 15    End of Session Equipment Utilized During Treatment: Gait belt;Oxygen Activity Tolerance: Patient tolerated treatment well Patient left: with call bell/phone within reach;with chair alarm set;in chair Nurse Communication: Mobility status PT Visit Diagnosis: Muscle weakness (generalized) (M62.81)     Time: 9201-0071 PT Time Calculation (min) (ACUTE ONLY): 20 min  Charges:  $Therapeutic Activity: 8-22 mins                     Abran Richard, PT Acute Rehab Services Pager [phone] [facility] [place] 04/25/2021, 3:01 PM

## 2021-04-25 NOTE — TOC Progression Note (Signed)
Transition of Care [facility]) - Progression Note    Patient Details  Name: Daniel King MRN: [id number] Date of Birth: [birth year]  Transition [facility]) CM/SW Contact  Joanne Chars, LCSW Phone Number: 04/25/2021, 3:51 PM  Clinical Narrative:   CSW spoke with pt and wife in room regarding need for [facility] and other DME.  Wife reports, and CSW confirmed with Zack from [facility], that since pt has medicare and also [employer], their cost to get DME through [facility] will be zero.  Wife and pt want to proceed with [employer] as they [facility] problems with [facility].  Zack met with pt regarding the [facility] and initiated the process.      Expected Discharge Plan: [place] Barriers to Discharge: Continued Medical Work up  Expected Discharge Plan and Services Expected Discharge Plan: [place] In-house Referral: Clinical Social Work   Post Acute Care Choice: [place] arrangements for the past 2 months: [place]: PT, OT [place] Agency: [facility] Date [place] Agency Contacted: 04/24/21 Time [place]: 0930 Representative spoke with at [place]: [place] ([place]) Interventions    Readmission Risk Interventions No flowsheet data found.

## 2021-04-25 NOTE — Plan of Care (Signed)
On room air patient becomes hypoxic , with Sats dropping to upper 70s-low 80s.

## 2021-04-25 NOTE — Progress Notes (Signed)
NAME:  Daniel King, MRN:  [id number], DOB:  Aug 21, 1946, LOS: 8 ADMISSION DATE:  04/17/2021, CONSULTATION DATE:  04/17/21 REFERRING MD:  Regenia Skeeter CHIEF COMPLAINT:  Dyspnea   History of Present Illness:  Daniel King is a 75 y.o. male who has a PMH including COPD per report (no PFT's available in our system to confirm or assess severity). He presented to [facility] ED 6/14 with dyspnea x 4-5 days.  He had attempted to use his home inhalers without any response.  EMS was called and upon their arrival, his sats were found to be in the 40s.  He was placed on a NRB with improvement only to 16s.  He was subsequently placed on CPAP and transported to the ED.  In the ED, ABG demonstrated respiratory acidosis.  He was started on BiPAP.  VBG 3 hours later was essentially unchanged.  PCCM subsequently asked to see in consultation.  Pertinent  Medical History:  has Pain in left knee; COPD exacerbation ([place]); Respiratory failure ([place]); and Pressure injury of skin on their problem list. COPD. Prostate cancer EtOH abuse  Significant Hospital Events: Including procedures, antibiotic start and stop dates in addition to other pertinent events   6/14 > admit  Micro Data: Flu 6/14 > neg. SARS CoV2 6/14 > neg.  Antibiotics: Ceftriaxone 6/14 > 6/15 Azithromycin 6/15> 6/18  Interim History / Subjective:  Wore bipap last night. Feels like he is getting the hang of it. No complaints. Spends most of the day at home in a chair. Hasn't been out of bed yet this admission.  Objective:  Blood pressure (!) 129/58, pulse 67, temperature 97.7 F (36.5 C), resp. rate (!) 21, height 6' (1.829 m), weight 66.6 kg, SpO2 97 %.    FiO2 (%):  [30 %] 30 %   Intake/Output Summary (Last 24 hours) at 04/25/2021 1026 Last data filed at 04/24/2021 2200 Gross per 24 hour  Intake 200 ml  Output --  Net 200 ml   Filed Weights   04/23/21 0500 04/24/21 0500 04/25/21 0500  Weight: 68.4 kg 67.3 kg 66.6 kg     Examination: General: chronically ill appearing, appears alert today HEENT: mmm, on nasal cannula Cardiovascular: RRR, no mrg Lungs:  barrel chested, diminished, no wheezes or crackles Abdomen: scaphoid, soft Musculoskeletal: muscle wasting.    Labs/imaging personally reviewed:  Na 134, K 4.0, Cr 0.71 CO2 40  Resolved Hospital Problem list:   Assessment & Plan:  Daniel King is a 75 y.o. 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 - s/p 5 days azithromycin and prednisone - wean O2 as able to maintain SpO2 88-90%, avoid sats > 92%. He will need home oxygen at discharge.  - BiPAP qhs and with naps - Can go home on home Stiolto and albuterol - PCO2 elevated despite BiPAP - This patient requires non-invasive ventilator for acute in chronic respiratory failure and COPD. Without ventilator, there is significant risk of untimely readmission to the hospital and increase on CO2 retention. BIPAP is ineffective in the home setting in reducing CO2 due to the lack of an auto-rate. Without Non invasive ventilator he has at high risk for readmissions and morbidity and mortality including death.   Acute metabolic encephalopathy, suspect delirium. Improved.  - continue OOB with PT/OT. His relative immobility prior to admission has likely worsened. I  am worried about this for him going home.   Goals of Care Although he is wearing bipap now, I still think goals of care conversation should be had with patient and his wife prior to discharge. Previously had been full code.    Spero Geralds, MD 04/25/21 10:26 AM [place] [facility] See Amion for contact details

## 2021-04-25 NOTE — Progress Notes (Signed)
PROGRESS NOTE    Daniel King  FBP:[id number] DOB: 08-Jul-1946 DOA: 04/17/2021 PCP: Henreitta Cea, MD    Brief Narrative:  75yo with hx copd, prostate caner, CVA within the past year, hx ETOH abuse who presented to the ED with worsening sob despite inhalers, ultimately admitted to ICU service, requiring BiPAP, now transferred to [facility]:   Active Problems:   COPD exacerbation (HCC)   Respiratory failure (HCC)   Pressure injury of skin   AECOPD Acute on chronic hypoxic and hypercapnic respiratory failure due to AECOPD. - s/p 5 days azithromycin and prednisone - PCO2 elevated despite BiPAP - This patient requires non-invasive ventilator for acute in chronic respiratory failure and COPD. Without ventilator, there is significant risk of untimely readmission to the hospital and increase on CO2 retention. BIPAP is ineffective in the home setting in reducing CO2 due to the lack of an auto-rate. Without Non invasive ventilator he has at high risk for readmissions and morbidity and mortality including death.  --as of May 15, 2021, Patient Saturations on Room Air at Rest = 83%.  Patient Saturations on 2 Liters of oxygen while Ambulating = 92%. Plan: --TOC to order Trilogy for home use - wean O2 as able to maintain SpO2 88-90%, avoid sats > 92%.  --walk test prior to discharge to determine home O2 use (currently needs 2L) - BiPAP qhs and with naps while inpatient - Can go home on home Stiolto and albuterol   Acute metabolic encephalopathy, suspect delirium. Improved. - continue OOB with PT/OT.  --home with HH and 24-hour support  Afib RVR, new diagnosis Acquired thrombophilia --Recent evidence of afib on ekg --Currently rate controlled --Attempted to anticoagulate, however pt developed hematuria --Anticoagulation now on hold and pt tolerating home ASA with plavix, without further hematuria Plan: --cont metop --Would have pt f/u as outpatient to address  anticoagulation in the future  Hx CVA --Per family, was diagnosed with CVA around 8/21 --Had tolerated ASA and plavix prior to admit Plan: --cont ASA and plavix  Hx prostate cancer --Noted to have hematuria with trial of therapeutic anticoagulation --Hematuria resolved off anticoagulation --cont XTANDI  Hx ETOH abuse --Most recent CIWA noted to be 0  Hyponatremia --intermittent and mild   DVT prophylaxis: scd's Code Status: Full Family Communication:  Status is: Inpatient   Dispo: The patient is from: Home              Anticipated d/c is to: Home              Patient currently is not medically stable to d/c.  Need tto set up Trilogy at home.    Difficult to place patient No   Consultants:  PCCM Palliative Care  Procedures:    Antimicrobials: Anti-infectives (From admission, onward)    Start     Dose/Rate Route Frequency Ordered Stop   04/18/21 1000  azithromycin (ZITHROMAX) tablet 250 mg        250 mg Oral Daily 04/18/21 0725 04/21/21 0833   04/17/21 0845  cefTRIAXone (ROCEPHIN) 2 g in sodium chloride 0.9 % 100 mL IVPB  Status:  Discontinued        2 g 200 mL/hr over 30 Minutes Intravenous Every 24 hours 04/17/21 0834 04/18/21 0725   04/17/21 0845  azithromycin (ZITHROMAX) 500 mg in sodium chloride 0.9 % 250 mL IVPB  Status:  Discontinued        500 mg 250 mL/hr over 60 Minutes Intravenous Every 24 hours 04/17/21  5427 04/18/21 0725       Subjective: Pt reported doing fine, having normal oral intake, urination and BM.  TOC to order Trilogy today.   Objective: Vitals:   04/25/21 0328 04/25/21 0500 04/25/21 0750 04/25/21 1410  BP:    (!) 103/49  Pulse: 69  67 78  Resp: (!) 21   19  Temp:    98.2 F (36.8 C)  TempSrc:    Oral  SpO2: 97%  97% 92%  Weight:  66.6 kg    Height:        Intake/Output Summary (Last 24 hours) at 04/25/2021 1717 Last data filed at 04/24/2021 2200 Gross per 24 hour  Intake 200 ml  Output --  Net 200 ml   Filed Weights    04/23/21 0500 04/24/21 0500 04/25/21 0500  Weight: 68.4 kg 67.3 kg 66.6 kg    Examination: Constitutional: NAD, alert HEENT: conjunctivae and lids normal, EOMI CV: No cyanosis.   RESP: mild wheezes and rhonchi, Prince not inside his nostrils.   Extremities: No effusions, edema in BLE SKIN: warm, dry Neuro: II - XII grossly intact.   Psych: Normal mood and affect.     Data Reviewed: I have personally reviewed following labs and imaging studies  CBC: Recent Labs  Lab 04/20/21 1614 04/21/21 0511 04/22/21 0200 04/23/21 0453 04/25/21 0045  WBC 8.6 7.4 8.8 8.2 9.0  HGB 9.9* 9.9* 10.3* 9.9* 9.7*  HCT 33.4* 33.8* 33.7* 32.5* 31.7*  MCV 93.0 92.3 90.8 89.3 89.0  PLT 352 363 371 325 062   Basic Metabolic Panel: Recent Labs  Lab 04/18/21 2013 04/19/21 0310 04/20/21 0212 04/20/21 1103 04/21/21 0511 04/22/21 0200 04/23/21 0453 04/25/21 0045  NA 133* 134* 133*  --  134* 135 134* 135  K 4.7 4.8 5.3*  --  4.8 4.6 4.0 4.7  CL 94* 93* 93*  --  88* 89* 90* 88*  CO2 31 35* 35*  --  41* 42* 40* 41*  GLUCOSE 109* 102* 95  --  95 124* 134* 87  BUN 11 13 9   --  8 10 8 8   CREATININE 0.94 0.96 0.68  --  0.75 1.00 0.71 0.81  CALCIUM 8.2* 8.1* 7.9*  --  8.4* 8.5* 8.1* 8.7*  MG 2.2 2.1  --  1.9  --   --   --  2.0   GFR: Estimated Creatinine Clearance: 74.2 mL/min (by C-G formula based on SCr of 0.81 mg/dL). Liver Function Tests: Recent Labs  Lab 04/20/21 0212 04/21/21 0511 04/22/21 0200 04/23/21 0453 04/25/21 0045  AST 10* 10* 11* 13* 11*  ALT 8 8 9 8 10   ALKPHOS 75 71 68 59 50  BILITOT 0.6 0.7 0.5 0.7 0.3  PROT 5.3* 5.6* 5.4* 5.0* 5.0*  ALBUMIN 2.2* 2.3* 2.2* 2.1* 2.2*   No results for input(s): LIPASE, AMYLASE in the last 168 hours. Recent Labs  Lab 04/20/21 1103  AMMONIA 44*   Coagulation Profile: No results for input(s): INR, PROTIME in the last 168 hours.  Cardiac Enzymes: No results for input(s): CKTOTAL, CKMB, CKMBINDEX, TROPONINI in the last 168 hours. BNP  (last 3 results) No results for input(s): PROBNP in the last 8760 hours. HbA1C: No results for input(s): HGBA1C in the last 72 hours. CBG: No results for input(s): GLUCAP in the last 168 hours.  Lipid Profile: No results for input(s): CHOL, HDL, LDLCALC, TRIG, CHOLHDL, LDLDIRECT in the last 72 hours. Thyroid Function Tests: No results for input(s): TSH, T4TOTAL, FREET4, T3FREE,  THYROIDAB in the last 72 hours. Anemia Panel: No results for input(s): VITAMINB12, FOLATE, FERRITIN, TIBC, IRON, RETICCTPCT in the last 72 hours. Sepsis Labs: No results for input(s): PROCALCITON, LATICACIDVEN in the last 168 hours.   Recent Results (from the past 240 hour(s))  Blood Culture (routine x 2)     Status: None   Collection Time: 04/17/21  8:30 AM   Specimen: BLOOD RIGHT ARM  Result Value Ref Range Status   Specimen Description BLOOD RIGHT ARM  Final   Special Requests   Final    BOTTLES DRAWN AEROBIC AND ANAEROBIC Blood Culture adequate volume   Culture   Final    NO GROWTH 5 DAYS Performed at [facility], [address], [place],  [address]    Report Status 04/22/2021 FINAL  Final  Resp Panel by RT-PCR (Flu A&B, Covid) Nasopharyngeal Swab     Status: None   Collection Time: 04/17/21  8:34 AM   Specimen: Nasopharyngeal Swab; Nasopharyngeal(NP) swabs in vial transport medium  Result Value Ref Range Status   SARS Coronavirus 2 by RT PCR NEGATIVE NEGATIVE Final    Comment: (NOTE) SARS-CoV-2 target nucleic acids are NOT DETECTED.  The SARS-CoV-2 RNA is generally detectable in upper respiratory specimens during the acute phase of infection. The lowest concentration of SARS-CoV-2 viral copies this assay can detect is 138 copies/mL. A negative result does not preclude SARS-Cov-2 infection and should not be used as the sole basis for treatment or other patient management decisions. A negative result may occur with  improper specimen collection/handling, submission of specimen  other than nasopharyngeal swab, presence of viral mutation(s) within the areas targeted by this assay, and inadequate number of viral copies(<138 copies/mL). A negative result must be combined with clinical observations, patient history, and epidemiological information. The expected result is Negative.  Fact Sheet for Patients:  EntrepreneurPulse.com.au  Fact Sheet for Healthcare Providers:  IncredibleEmployment.be  This test is no t yet approved or cleared by the [place] FDA and  has been authorized for detection and/or diagnosis of SARS-CoV-2 by FDA under an Emergency Use Authorization (EUA). This EUA will remain  in effect (meaning this test can be used) for the duration of the COVID-19 declaration under Section 564(b)(1) of the Act, 21 U.S.C.section 360bbb-3(b)(1), unless the authorization is terminated  or revoked sooner.       Influenza A by PCR NEGATIVE NEGATIVE Final   Influenza B by PCR NEGATIVE NEGATIVE Final    Comment: (NOTE) The Xpert Xpress SARS-CoV-2/FLU/RSV plus assay is intended as an aid in the diagnosis of influenza from Nasopharyngeal swab specimens and should not be used as a sole basis for treatment. Nasal washings and aspirates are unacceptable for Xpert Xpress SARS-CoV-2/FLU/RSV testing.  Fact Sheet for Patients: EntrepreneurPulse.com.au  Fact Sheet for Healthcare Providers: IncredibleEmployment.be  This test is not yet approved or cleared by the [place] FDA and has been authorized for detection and/or diagnosis of SARS-CoV-2 by FDA under an Emergency Use Authorization (EUA). This EUA will remain in effect (meaning this test can be used) for the duration of the COVID-19 declaration under Section 564(b)(1) of the Act, 21 U.S.C. section 360bbb-3(b)(1), unless the authorization is terminated or revoked.  Performed at [facility], [place] [address], [place],   [address]   Blood Culture (routine x 2)     Status: None   Collection Time: 04/17/21  8:46 AM   Specimen: BLOOD LEFT ARM  Result Value Ref Range Status  Specimen Description BLOOD LEFT ARM  Final   Special Requests   Final    BOTTLES DRAWN AEROBIC AND ANAEROBIC Blood Culture results may not be optimal due to an inadequate volume of blood received in culture bottles   Culture   Final    NO GROWTH 5 DAYS Performed at [facility], [place] [address] [place], [place] [address]    Report Status 04/22/2021 FINAL  Final  MRSA Next Gen by PCR, Nasal     Status: None   Collection Time: 04/17/21  3:13 PM  Result Value Ref Range Status   MRSA by PCR Next Gen NOT DETECTED NOT DETECTED Final    Comment: (NOTE) The GeneXpert MRSA Assay (FDA approved for NASAL specimens only), is one component of a comprehensive MRSA colonization surveillance program. It is not intended to diagnose MRSA infection nor to guide or monitor treatment for MRSA infections. Test performance is not FDA approved in patients less than 93 years old. Performed at [facility], [place] [address], [place], [place] [address]   Culture, blood (routine x 2)     Status: None   Collection Time: 04/17/21  4:05 PM   Specimen: BLOOD  Result Value Ref Range Status   Specimen Description BLOOD LEFT ANTECUBITAL  Final   Special Requests   Final    BOTTLES DRAWN AEROBIC ONLY Blood Culture results may not be optimal due to an inadequate volume of blood received in culture bottles   Culture   Final    NO GROWTH 5 DAYS Performed at [facility], [place] [address], [place], [place] [address]    Report Status 04/22/2021 FINAL  Final  Culture, blood (routine x 2)     Status: None   Collection Time: 04/17/21  4:15 PM   Specimen: BLOOD RIGHT HAND  Result Value Ref Range Status   Specimen Description BLOOD RIGHT HAND  Final   Special Requests   Final    BOTTLES DRAWN AEROBIC ONLY Blood Culture adequate volume   Culture    Final    NO GROWTH 5 DAYS Performed at [facility], [place] [address], [place], [place] [address]    Report Status 04/22/2021 FINAL  Final  Urine culture     Status: Abnormal   Collection Time: 04/17/21  6:33 PM   Specimen: In/Out Cath Urine  Result Value Ref Range Status   Specimen Description IN/OUT CATH URINE  Final   Special Requests   Final    NONE Performed at [facility], [place] [address] [place], [place] [address]    Culture MULTIPLE SPECIES PRESENT, SUGGEST RECOLLECTION (A)  Final   Report Status 04/18/2021 FINAL  Final      Radiology Studies: No results found.  Scheduled Meds:  arformoterol  15 mcg Nebulization BID   ascorbic acid  500 mg Oral Daily   aspirin EC  81 mg Oral Daily   atorvastatin  40 mg Oral QPM   budesonide (PULMICORT) nebulizer solution  0.5 mg Nebulization BID   Chlorhexidine Gluconate Cloth  6 each Topical Daily   cholecalciferol  2,000 Units Oral Daily   clopidogrel  75 mg Oral Daily   enzalutamide  160 mg Oral Daily   metoprolol tartrate  12.5 mg Oral BID   pantoprazole  40 mg Oral QHS   revefenacin  175 mcg Nebulization Daily   Continuous Infusions:  sodium chloride Stopped (04/17/21 1351)     LOS: 8 days  Enzo Bi, MD [facility] Pager On Amion  If 7PM-7AM, please contact night-coverage 04/25/2021, 5:17 PM

## 2021-04-26 ENCOUNTER — Telehealth: Payer: Self-pay | Admitting: Internal Medicine

## 2021-04-26 DIAGNOSIS — Z515 Encounter for palliative care: Secondary | ICD-10-CM

## 2021-04-26 LAB — MAGNESIUM: Magnesium: 1.9 mg/dL (ref 1.7–2.4)

## 2021-04-26 LAB — BASIC METABOLIC PANEL
Anion gap: 7 (ref 5–15)
BUN: 11 mg/dL (ref 8–23)
CO2: 39 mmol/L — ABNORMAL HIGH (ref 22–32)
Calcium: 8.5 mg/dL — ABNORMAL LOW (ref 8.9–10.3)
Chloride: 89 mmol/L — ABNORMAL LOW (ref 98–111)
Creatinine, Ser: 0.83 mg/dL (ref 0.61–1.24)
GFR, Estimated: >60 mL/min (ref >60)
Glucose, Bld: 130 mg/dL — ABNORMAL HIGH (ref 70–99)
Potassium: 4 mmol/L (ref 3.5–5.1)
Sodium: 135 mmol/L (ref 135–145)

## 2021-04-26 LAB — CBC
HCT: 30.9 % — ABNORMAL LOW (ref 39.0–52.0)
Hemoglobin: 9.5 g/dL — ABNORMAL LOW (ref 13.0–17.0)
MCH: 27.1 pg (ref 26.0–34.0)
MCHC: 30.7 g/dL (ref 30.0–36.0)
MCV: 88.3 fL (ref 80.0–100.0)
Platelets: 327 10*3/uL (ref 150–400)
RBC: 3.5 MIL/uL — ABNORMAL LOW (ref 4.22–5.81)
RDW: 16.9 % — ABNORMAL HIGH (ref 11.5–15.5)
WBC: 8.3 10*3/uL (ref 4.0–10.5)
nRBC: 0 % (ref 0.0–0.2)

## 2021-04-26 NOTE — Progress Notes (Signed)
[facility] ([facility])  Hospital Liaison RN note         Notified by TOC manager of patient/family request for [facility] Palliative services at home after discharge.              [facility] Palliative team will follow up with patient after discharge.         Please call with any hospice or palliative related questions.         Thank you for the opportunity to participate in this patient's care.     Chrislyn King, BSN, RN [facility] Hospital Liaison (listed on AMION under Hospice/[facility])    [phone] (24h on call)    

## 2021-04-26 NOTE — Consult Note (Signed)
Consultation Note Date: 04/26/2021   Patient Name: Daniel King  DOB: 08/05/1946  MRN: [id number]  Age / Sex: 75 y.o., male  PCP: Henreitta Cea, MD Referring Physician: Enzo Bi, MD  Reason for Consultation: Establishing goals of care  HPI/Patient Profile: 75 y.o. male  with past medical history of COPD, prostate CA, CVA, ETOH abuse, and a fib admitted on 04/17/2021 with shortness of breath. Patient diagnosed with acute on chronic hypoxic and hypercapnic respiratory failure due to AECOPD. Patient was initially refusing bipap but has become more compliant with bipap throughout hospitalization. PMT consulted to discuss [place].  Clinical Assessment and Goals of Care: I have reviewed medical records including EPIC notes, labs and imaging, assessed the patient and then met with patient and wife [name]  to discuss diagnosis prognosis, GOC, EOL wishes, disposition and options.  I introduced Palliative Medicine as specialized medical care for people living with serious illness. It focuses on providing relief from the symptoms and stress of a serious illness. The goal is to improve quality of life for both the patient and the family.  As far as functional and nutritional status, patient is declining. Wife reports that he is no longer ambulatory - mostly in his wheelchair - is able to stand and pivot with a walker. She reports a poor appetite at home. Spends a lot of time sleeping.    We discussed patient's current illness and what it means in the larger context of patient's on-going co-morbidities.  Natural disease trajectory and expectations at EOL were discussed. We discussed his advanced COPD and need for bipap for home.   I attempted to elicit values and goals of care important to the patient.    Advance directives, concepts specific to code status, artificial feeding and hydration, and rehospitalization were considered and discussed.  I completed  a MOST form today. The patient and family outlined their wishes for the following treatment decisions:  Cardiopulmonary Resuscitation: Attempt Resuscitation (CPR)  Medical Interventions: Full Scope of Treatment: Use intubation, advanced airway interventions, mechanical ventilation, cardioversion as indicated, medical treatment, IV fluids, etc, also provide comfort measures. Transfer to the hospital if indicated  Antibiotics: Antibiotics if indicated  IV Fluids: IV fluids if indicated  Feeding Tube: Feeding tube for a defined trial period   Discussed with patient/family the importance of continued conversation with family and the medical providers regarding overall plan of care and treatment options, ensuring decisions are within the context of the patient's values and GOCs.    Palliative Care services outpatient were explained and offered. Patient and family agreeable to outpatient palliative support.   Questions and concerns were addressed. The family was encouraged to call with questions or concerns.   Primary Decision Maker PATIENT - joined by next of kin, wife [name]   SUMMARY OF RECOMMENDATIONS   - full code/full scope - MOST completed as above - outpatient palliative follow up  Code Status/Advance Care Planning: Full code  Additional Recommendations (Limitations, Scope, Preferences): Full Scope Treatment  Discharge Planning: Home with Home Health      Primary Diagnoses: Present on Admission:  COPD exacerbation ([place])  Respiratory failure ([place])   I have reviewed the medical record, interviewed the patient and family, and examined the patient. The following aspects are pertinent.  Past Medical History:  Diagnosis Date   AAA (abdominal aortic aneurysm) ([place])    Bladder cancer ([place])    prostate   COPD (chronic obstructive pulmonary disease) ([place])    Hypertension  Social History   Socioeconomic History   Marital status: Married    Spouse name: Not on file    Number of children: Not on file   Years of education: Not on file   Highest education level: Not on file  Occupational History   Not on file  Tobacco Use   Smoking status: Every Day    Packs/day: 1.50    Years: 52.00    Pack years: 78.00    Types: Cigarettes   Smokeless tobacco: Never  Vaping Use   Vaping Use: Never used  Substance and Sexual Activity   Alcohol use: Yes    Alcohol/week: 21.0 standard drinks    Types: 21 Shots of liquor per week   Drug use: No   Sexual activity: Not on file  Other Topics Concern   Not on file  Social History Narrative   Not on file   Social Determinants of Health   Financial Resource Strain: Not on file  Food Insecurity: Not on file  Transportation Needs: Not on file  Physical Activity: Not on file  Stress: Not on file  Social Connections: Not on file   History reviewed. No pertinent family history. Scheduled Meds:  arformoterol  15 mcg Nebulization BID   ascorbic acid  500 mg Oral Daily   aspirin EC  81 mg Oral Daily   atorvastatin  40 mg Oral QPM   budesonide (PULMICORT) nebulizer solution  0.5 mg Nebulization BID   Chlorhexidine Gluconate Cloth  6 each Topical Daily   cholecalciferol  2,000 Units Oral Daily   clopidogrel  75 mg Oral Daily   enzalutamide  160 mg Oral Daily   metoprolol tartrate  12.5 mg Oral BID   pantoprazole  40 mg Oral QHS   revefenacin  175 mcg Nebulization Daily   Continuous Infusions:  sodium chloride Stopped (04/17/21 1351)   PRN Meds:.acetaminophen, docusate sodium, ipratropium-albuterol, ondansetron (ZOFRAN) IV, polyethylene glycol No Known Allergies Review of Systems  All other systems reviewed and are negative.  Physical Exam Constitutional:      Comments: Falls asleep easily during conversation  Pulmonary:     Effort: Pulmonary effort is normal.  Skin:    General: Skin is warm and dry.  Neurological:     Mental Status: He is alert.    Vital Signs: BP 133/85 (BP Location: Left Arm)    Pulse 72   Temp 97.8 F (36.6 C) (Oral)   Resp 18   Ht 6' (1.829 m)   Wt 67.6 kg   SpO2 100%   BMI 20.21 kg/m  Pain Scale: 0-10   Pain Score: 0-No pain   SpO2: SpO2: 100 % O2 Device:SpO2: 100 % O2 Flow Rate: .O2 Flow Rate (L/min): 1 L/min  IO: Intake/output summary:  Intake/Output Summary (Last 24 hours) at 04/26/2021 1022 Last data filed at 04/26/2021 0645 Gross per 24 hour  Intake --  Output 1300 ml  Net -1300 ml    LBM: Last BM Date: 04/26/21 Baseline Weight: Weight: 63.5 kg Most recent weight: Weight: 67.6 kg     Palliative Assessment/Data: PPS 50%   Flowsheet Rows    Flowsheet Row Most Recent Value  Intake Tab   Referral Department Hospitalist  Unit at Time of Referral Cardiac/Telemetry Unit  Palliative Care Primary Diagnosis Pulmonary  Date Notified 04/24/21  Palliative Care Type New Palliative care  Reason for referral Clarify Goals of Care  Date of Admission 04/17/21  Date first seen by Palliative Care 04/25/21  # of  days Palliative referral response time 1 Day(s)  # of days IP prior to Palliative referral 7  Clinical Assessment   Psychosocial & Spiritual Assessment   Palliative Care Outcomes        Time Total: 55 minutes Greater than 50%  of this time was spent counseling and coordinating care related to the above assessment and plan.  Juel Burrow, DNP, AGNP-C Palliative Medicine Team [phone] Pager: [phone]

## 2021-04-26 NOTE — Telephone Encounter (Signed)
Schedule 2-3 weeks hospital follow up visit

## 2021-04-26 NOTE — Telephone Encounter (Signed)
Lm for patient.  

## 2021-04-26 NOTE — TOC Progression Note (Addendum)
Transition of Care [facility]) - Progression Note    Patient Details  Name: Daniel King MRN: [id number] Date of Birth: 1945-12-17  [facility]) CM/SW Contact  Joanne Chars, LCSW Phone Number: 04/26/2021, 11:03 AM  Clinical Narrative:   Trilogy order faxed to [place] at [place].    1200: TC [facility].  Trilogy has been approved.  Laurena Spies RN specialist also present and involved in this discussion.  [employer] will bring Trilogy to pt today, set up in room and do initial training.  Edwyna Ready also has talked with wife and will deliver bed this evening along with other DME.        Expected Discharge Plan: [place] Barriers to Discharge: Continued Medical Work up  Expected Discharge Plan and Services Expected Discharge Plan: [place] In-house Referral: Clinical Social Work   Post Acute Care Choice: [place] arrangements for the past 2 months: [place]: PT, OT [place] Agency: [facility] Date [place] Agency Contacted: 04/24/21 Time [place]: 0930 Representative spoke with at [place]: [place] ([place]) Interventions    Readmission Risk Interventions No flowsheet data found.

## 2021-04-26 NOTE — Progress Notes (Signed)
   NAME:  Daniel King, MRN:  [id number], DOB:  08/13/1946, LOS: 9 ADMISSION DATE:  04/17/2021, CONSULTATION DATE:  04/17/21 REFERRING MD:  Regenia Skeeter CHIEF COMPLAINT:  Dyspnea   History of Present Illness:  Daniel King is a 75 y.o. male who has a PMH including COPD per report (no PFT's available in our system to confirm or assess severity). He presented to [facility] ED 6/14 with dyspnea x 4-5 days.  He had attempted to use his home inhalers without any response.  EMS was called and upon their arrival, his sats were found to be in the 40s.  He was placed on a NRB with improvement only to 96s.  He was subsequently placed on CPAP and transported to the ED.  In the ED, ABG demonstrated respiratory acidosis.  He was started on BiPAP.  VBG 3 hours later was essentially unchanged.  PCCM subsequently asked to see in consultation.  Pertinent  Medical History:  has Pain in left knee; COPD exacerbation ([place]); Respiratory failure ([place]); and Pressure injury of skin on their problem list. COPD. Prostate cancer EtOH abuse  Significant Hospital Events: Including procedures, antibiotic start and stop dates in addition to other pertinent events   6/14 > admit  Micro Data: Flu 6/14 > neg. SARS CoV2 6/14 > neg.  Antibiotics: Ceftriaxone 6/14 > 6/15 Azithromycin 6/15> 6/18  Interim History / Subjective:  Slightly wheezy this morning. Has received breathing treatments. Wife at bedside.   Objective:  Blood pressure 133/85, pulse 72, temperature 97.8 F (36.6 C), temperature source Oral, resp. rate 18, height 6' (1.829 m), weight 67.6 kg, SpO2 100 %.    FiO2 (%):  [30 %] 30 %   Intake/Output Summary (Last 24 hours) at 04/26/2021 0937 Last data filed at 04/26/2021 0645 Gross per 24 hour  Intake --  Output 1300 ml  Net -1300 ml   Filed Weights   04/24/21 0500 04/25/21 0500 04/26/21 0500  Weight: 67.3 kg 66.6 kg 67.6 kg    Examination: General: resting comfortably, no distress HEENT:  mmm Cardiovascular: RRR, no mrg Lungs: barrel chested, mild end expiratory wheezes Musculoskeletal: muscle wasting.    Labs/imaging personally reviewed:  Na 135, Co2 39, Cr 0.83, Hgb 9.5   Resolved Hospital Problem list:  Acute metabolic encephalopathy - delirium Assessment & Plan:  Daniel King is a 75 y.o. 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 - s/p 5 days azithromycin and prednisone - wean O2 as able to maintain SpO2 88-90%, avoid sats > 92%. He will need home oxygen at discharge.  - BiPAP qhs and with naps - PCO2 elevated despite BiPAP - This patient requires non-invasive ventilator for acute in chronic respiratory failure and COPD. Without ventilator, there is significant risk of untimely readmission to the hospital and increase on CO2 retention. BIPAP is ineffective in the home setting in reducing CO2 due to the lack of an auto-rate. Without Non invasive ventilator he has at high risk for readmissions and morbidity and mortality including death.  - will arrange for follow up in the office with [place].  - Can go home on home Stiolto and albuterol   Daniel Geralds, MD 04/26/21 9:37 AM [facility] See Amion for contact details

## 2021-04-26 NOTE — Progress Notes (Signed)
PROGRESS NOTE    Daniel King  XKG:[id number] DOB: 12/09/1945 DOA: 04/17/2021 PCP: Henreitta Cea, MD    Brief Narrative:  75yo with hx copd, prostate caner, CVA within the past year, hx ETOH abuse who presented to the ED with worsening sob despite inhalers, ultimately admitted to ICU service, requiring BiPAP, now transferred to [place]:   Active Problems:   COPD exacerbation (HCC)   Respiratory failure (HCC)   Pressure injury of skin   AECOPD Acute on chronic hypoxic and hypercapnic respiratory failure due to AECOPD. - s/p 5 days azithromycin and prednisone - PCO2 elevated despite BiPAP - This patient requires non-invasive ventilator for acute in chronic respiratory failure and COPD. Without ventilator, there is significant risk of untimely readmission to the hospital and increase on CO2 retention. BIPAP is ineffective in the home setting in reducing CO2 due to the lack of an auto-rate. Without Non invasive ventilator he has at high risk for readmissions and morbidity and mortality including death.  --as of 05-04-21, Patient Saturations on Room Air at Rest = 83%.  Patient Saturations on 2 Liters of oxygen while Ambulating = 92%. --Trilogy ordered Plan: - BiPAP qhs and with naps while inpatient - Can go home on home Stiolto and albuterol, per pulm --d/c when Trilogy delivered --outpatient followup with pulm Dr. Shearon Stalls   Acute metabolic encephalopathy, suspect delirium. Improved. - continue OOB with PT/OT.  --home with HH and 24-hour support from wife  Afib RVR, new diagnosis Acquired thrombophilia --Recent evidence of afib on ekg --Currently rate controlled --Attempted to anticoagulate, however pt developed hematuria --Anticoagulation now on hold and pt tolerating home ASA with plavix, without further hematuria Plan: --cont metop --Would have pt f/u as outpatient to address anticoagulation in the future  Hx CVA --Per family, was diagnosed  with CVA around 8/21 --Had tolerated ASA and plavix prior to admit Plan: --cont ASA and plavix  Hx prostate cancer --Noted to have hematuria with trial of therapeutic anticoagulation --Hematuria resolved off anticoagulation --cont home Xtandi  Hx ETOH abuse --Most recent CIWA noted to be 0  Hyponatremia --intermittent and mild  HTN --cont metop --cont to hold home amlodipine and Lisinopril due to intermittent soft BP   DVT prophylaxis: scd's Code Status: Full Family Communication: wife updated on the phone today Status is: Inpatient   Dispo: The patient is from: Home              Anticipated d/c is to: Home tomorrow after delivery of DME and trilogy              Patient currently is medically stable to d/c.      Difficult to place patient No   Consultants:  PCCM Palliative Care  Procedures:    Antimicrobials: Anti-infectives (From admission, onward)    Start     Dose/Rate Route Frequency Ordered Stop   04/18/21 1000  azithromycin (ZITHROMAX) tablet 250 mg        250 mg Oral Daily 04/18/21 0725 04/21/21 0833   04/17/21 0845  cefTRIAXone (ROCEPHIN) 2 g in sodium chloride 0.9 % 100 mL IVPB  Status:  Discontinued        2 g 200 mL/hr over 30 Minutes Intravenous Every 24 hours 04/17/21 0834 04/18/21 0725   04/17/21 0845  azithromycin (ZITHROMAX) 500 mg in sodium chloride 0.9 % 250 mL IVPB  Status:  Discontinued        500 mg 250 mL/hr over 60 Minutes Intravenous  Every 24 hours 04/17/21 0834 04/18/21 0725       Subjective: Pt reported doing fine.  No dyspnea.     Objective: Vitals:   04/26/21 0534 04/26/21 0753 04/26/21 0758 04/26/21 0809  BP: 137/68   133/85  Pulse: 66   72  Resp: (!) 21   18  Temp: 98.3 F (36.8 C)   97.8 F (36.6 C)  TempSrc:    Oral  SpO2: 96% 98% 98% 100%  Weight:      Height:        Intake/Output Summary (Last 24 hours) at 04/26/2021 1758 Last data filed at 04/26/2021 1700 Gross per 24 hour  Intake --  Output 1900 ml  Net  -1900 ml   Filed Weights   04/24/21 0500 04/25/21 0500 04/26/21 0500  Weight: 67.3 kg 66.6 kg 67.6 kg    Examination: Constitutional: NAD, AAOx3 HEENT: conjunctivae and lids normal, EOMI CV: No cyanosis.   RESP: normal respiratory effort Extremities: No effusions, edema in BLE SKIN: warm, dry Neuro: II - XII grossly intact.   Psych: Normal mood and affect.  Appropriate judgement and reason   Data Reviewed: I have personally reviewed following labs and imaging studies  CBC: Recent Labs  Lab 04/21/21 0511 04/22/21 0200 04/23/21 0453 04/25/21 0045 04/26/21 0145  WBC 7.4 8.8 8.2 9.0 8.3  HGB 9.9* 10.3* 9.9* 9.7* 9.5*  HCT 33.8* 33.7* 32.5* 31.7* 30.9*  MCV 92.3 90.8 89.3 89.0 88.3  PLT 363 371 325 337 532   Basic Metabolic Panel: Recent Labs  Lab 04/20/21 1103 04/21/21 0511 04/22/21 0200 04/23/21 0453 04/25/21 0045 04/26/21 0145  NA  --  134* 135 134* 135 135  K  --  4.8 4.6 4.0 4.7 4.0  CL  --  88* 89* 90* 88* 89*  CO2  --  41* 42* 40* 41* 39*  GLUCOSE  --  95 124* 134* 87 130*  BUN  --  8 10 8 8 11   CREATININE  --  0.75 1.00 0.71 0.81 0.83  CALCIUM  --  8.4* 8.5* 8.1* 8.7* 8.5*  MG 1.9  --   --   --  2.0 1.9   GFR: Estimated Creatinine Clearance: 73.5 mL/min (by C-G formula based on SCr of 0.83 mg/dL). Liver Function Tests: Recent Labs  Lab 04/20/21 0212 04/21/21 0511 04/22/21 0200 04/23/21 0453 04/25/21 0045  AST 10* 10* 11* 13* 11*  ALT 8 8 9 8 10   ALKPHOS 75 71 68 59 50  BILITOT 0.6 0.7 0.5 0.7 0.3  PROT 5.3* 5.6* 5.4* 5.0* 5.0*  ALBUMIN 2.2* 2.3* 2.2* 2.1* 2.2*   No results for input(s): LIPASE, AMYLASE in the last 168 hours. Recent Labs  Lab 04/20/21 1103  AMMONIA 44*   Coagulation Profile: No results for input(s): INR, PROTIME in the last 168 hours.  Cardiac Enzymes: No results for input(s): CKTOTAL, CKMB, CKMBINDEX, TROPONINI in the last 168 hours. BNP (last 3 results) No results for input(s): PROBNP in the last 8760  hours. HbA1C: No results for input(s): HGBA1C in the last 72 hours. CBG: No results for input(s): GLUCAP in the last 168 hours.  Lipid Profile: No results for input(s): CHOL, HDL, LDLCALC, TRIG, CHOLHDL, LDLDIRECT in the last 72 hours. Thyroid Function Tests: No results for input(s): TSH, T4TOTAL, FREET4, T3FREE, THYROIDAB in the last 72 hours. Anemia Panel: No results for input(s): VITAMINB12, FOLATE, FERRITIN, TIBC, IRON, RETICCTPCT in the last 72 hours. Sepsis Labs: No results for input(s): PROCALCITON, LATICACIDVEN in  the last 168 hours.   Recent Results (from the past 240 hour(s))  Blood Culture (routine x 2)     Status: None   Collection Time: 04/17/21  8:30 AM   Specimen: BLOOD RIGHT ARM  Result Value Ref Range Status   Specimen Description BLOOD RIGHT ARM  Final   Special Requests   Final    BOTTLES DRAWN AEROBIC AND ANAEROBIC Blood Culture adequate volume   Culture   Final    NO GROWTH 5 DAYS Performed at [facility], [address], [place], [place] [address]    Report Status 04/22/2021 FINAL  Final  Resp Panel by RT-PCR (Flu A&B, Covid) Nasopharyngeal Swab     Status: None   Collection Time: 04/17/21  8:34 AM   Specimen: Nasopharyngeal Swab; Nasopharyngeal(NP) swabs in vial transport medium  Result Value Ref Range Status   SARS Coronavirus 2 by RT PCR NEGATIVE NEGATIVE Final    Comment: (NOTE) SARS-CoV-2 target nucleic acids are NOT DETECTED.  The SARS-CoV-2 RNA is generally detectable in upper respiratory specimens during the acute phase of infection. The lowest concentration of SARS-CoV-2 viral copies this assay can detect is 138 copies/mL. A negative result does not preclude SARS-Cov-2 infection and should not be used as the sole basis for treatment or other patient management decisions. A negative result may occur with  improper specimen collection/handling, submission of specimen other than nasopharyngeal swab, presence of viral mutation(s) within  the areas targeted by this assay, and inadequate number of viral copies(<138 copies/mL). A negative result must be combined with clinical observations, patient history, and epidemiological information. The expected result is Negative.  Fact Sheet for Patients:  EntrepreneurPulse.com.au  Fact Sheet for Healthcare Providers:  IncredibleEmployment.be  This test is no t yet approved or cleared by the [place] FDA and  has been authorized for detection and/or diagnosis of SARS-CoV-2 by FDA under an Emergency Use Authorization (EUA). This EUA will remain  in effect (meaning this test can be used) for the duration of the COVID-19 declaration under Section 564(b)(1) of the Act, 21 U.S.C.section 360bbb-3(b)(1), unless the authorization is terminated  or revoked sooner.       Influenza A by PCR NEGATIVE NEGATIVE Final   Influenza B by PCR NEGATIVE NEGATIVE Final    Comment: (NOTE) The Xpert Xpress SARS-CoV-2/FLU/RSV plus assay is intended as an aid in the diagnosis of influenza from Nasopharyngeal swab specimens and should not be used as a sole basis for treatment. Nasal washings and aspirates are unacceptable for Xpert Xpress SARS-CoV-2/FLU/RSV testing.  Fact Sheet for Patients: EntrepreneurPulse.com.au  Fact Sheet for Healthcare Providers: IncredibleEmployment.be  This test is not yet approved or cleared by the [place] FDA and has been authorized for detection and/or diagnosis of SARS-CoV-2 by FDA under an Emergency Use Authorization (EUA). This EUA will remain in effect (meaning this test can be used) for the duration of the COVID-19 declaration under Section 564(b)(1) of the Act, 21 U.S.C. section 360bbb-3(b)(1), unless the authorization is terminated or revoked.  Performed at [facility], [place] [address], [place], [place] [address]   Blood Culture (routine x 2)     Status: None    Collection Time: 04/17/21  8:46 AM   Specimen: BLOOD LEFT ARM  Result Value Ref Range Status   Specimen Description BLOOD LEFT ARM  Final   Special Requests   Final    BOTTLES DRAWN AEROBIC AND ANAEROBIC Blood Culture results may not be optimal due to  an inadequate volume of blood received in culture bottles   Culture   Final    NO GROWTH 5 DAYS Performed at [facility], [place] [address], [place], [place] [address]    Report Status 04/22/2021 FINAL  Final  MRSA Next Gen by PCR, Nasal     Status: None   Collection Time: 04/17/21  3:13 PM  Result Value Ref Range Status   MRSA by PCR Next Gen NOT DETECTED NOT DETECTED Final    Comment: (NOTE) The GeneXpert MRSA Assay (FDA approved for NASAL specimens only), is one component of a comprehensive MRSA colonization surveillance program. It is not intended to diagnose MRSA infection nor to guide or monitor treatment for MRSA infections. Test performance is not FDA approved in patients less than 28 years old. Performed at [facility], [place] [address] [place], [place] [address]   Culture, blood (routine x 2)     Status: None   Collection Time: 04/17/21  4:05 PM   Specimen: BLOOD  Result Value Ref Range Status   Specimen Description BLOOD LEFT ANTECUBITAL  Final   Special Requests   Final    BOTTLES DRAWN AEROBIC ONLY Blood Culture results may not be optimal due to an inadequate volume of blood received in culture bottles   Culture   Final    NO GROWTH 5 DAYS Performed at [facility], [place] [address] [place], [place] [address]    Report Status 04/22/2021 FINAL  Final  Culture, blood (routine x 2)     Status: None   Collection Time: 04/17/21  4:15 PM   Specimen: BLOOD RIGHT HAND  Result Value Ref Range Status   Specimen Description BLOOD RIGHT HAND  Final   Special Requests   Final    BOTTLES DRAWN AEROBIC ONLY Blood Culture adequate volume   Culture   Final    NO GROWTH 5 DAYS Performed at [facility],  [place] [address] [place], [place] [address]    Report Status 04/22/2021 FINAL  Final  Urine culture     Status: Abnormal   Collection Time: 04/17/21  6:33 PM   Specimen: In/Out Cath Urine  Result Value Ref Range Status   Specimen Description IN/OUT CATH URINE  Final   Special Requests   Final    NONE Performed at [facility], [place] [address] [place], [place] [address]    Culture MULTIPLE SPECIES PRESENT, SUGGEST RECOLLECTION (A)  Final   Report Status 04/18/2021 FINAL  Final      Radiology Studies: No results found.  Scheduled Meds:  arformoterol  15 mcg Nebulization BID   ascorbic acid  500 mg Oral Daily   aspirin EC  81 mg Oral Daily   atorvastatin  40 mg Oral QPM   budesonide (PULMICORT) nebulizer solution  0.5 mg Nebulization BID   Chlorhexidine Gluconate Cloth  6 each Topical Daily   cholecalciferol  2,000 Units Oral Daily   clopidogrel  75 mg Oral Daily   enzalutamide  160 mg Oral Daily   metoprolol tartrate  12.5 mg Oral BID   pantoprazole  40 mg Oral QHS   revefenacin  175 mcg Nebulization Daily   Continuous Infusions:  sodium chloride Stopped (04/17/21 1351)     LOS: 9 days   Enzo Bi, MD [facility] Pager On Amion  If 7PM-7AM, please contact night-coverage 04/26/2021, 5:58 PM

## 2021-04-27 LAB — BASIC METABOLIC PANEL
Anion gap: 7 (ref 5–15)
BUN: 8 mg/dL (ref 8–23)
CO2: 36 mmol/L — ABNORMAL HIGH (ref 22–32)
Calcium: 8.1 mg/dL — ABNORMAL LOW (ref 8.9–10.3)
Chloride: 90 mmol/L — ABNORMAL LOW (ref 98–111)
Creatinine, Ser: 0.7 mg/dL (ref 0.61–1.24)
GFR, Estimated: >60 mL/min (ref >60)
Glucose, Bld: 127 mg/dL — ABNORMAL HIGH (ref 70–99)
Potassium: 3.6 mmol/L (ref 3.5–5.1)
Sodium: 133 mmol/L — ABNORMAL LOW (ref 135–145)

## 2021-04-27 LAB — CBC
HCT: 29.9 % — ABNORMAL LOW (ref 39.0–52.0)
Hemoglobin: 9.1 g/dL — ABNORMAL LOW (ref 13.0–17.0)
MCH: 26.9 pg (ref 26.0–34.0)
MCHC: 30.4 g/dL (ref 30.0–36.0)
MCV: 88.5 fL (ref 80.0–100.0)
Platelets: 297 10*3/uL (ref 150–400)
RBC: 3.38 MIL/uL — ABNORMAL LOW (ref 4.22–5.81)
RDW: 16.7 % — ABNORMAL HIGH (ref 11.5–15.5)
WBC: 8.2 10*3/uL (ref 4.0–10.5)
nRBC: 0 % (ref 0.0–0.2)

## 2021-04-27 LAB — MAGNESIUM: Magnesium: 1.8 mg/dL (ref 1.7–2.4)

## 2021-04-27 MED ORDER — LISINOPRIL 20 MG PO TABS: ORAL_TABLET | ORAL | Status: AC

## 2021-04-27 MED ORDER — AMLODIPINE BESYLATE 5 MG PO TABS: ORAL_TABLET | ORAL | Status: AC

## 2021-04-27 NOTE — TOC Transition Note (Signed)
Transition of Care [facility]) - CM/SW Discharge Note   Patient Details  Name: FINCH COSTANZO MRN: [id number] Date of Birth: Nov 06, 1945  [facility] CM/SW Contact:  Joanne Chars, LCSW Phone Number: 04/27/2021, 10:21 AM   Clinical Narrative:   Pt discharging home with [facility].  Pt also referred for palliative care services (see [facility] note)  [facility] provided DME: hospital bed, 3n1, home o2, and trilogy machine.  Pt will transfer home with [facility]. No other needs identified.     Final next level of care: [place] Barriers to Discharge: Barriers Resolved   Patient Goals and CMS Choice Patient states their goals for this hospitalization and ongoing recovery are:: "get back to normal" CMS Medicare.gov Compare Post Acute Care list provided to:: Patient Choice offered to / list presented to : Patient  Discharge Placement                Patient to be transferred to facility by: [place] Name of family member notified: Caren Griffins wife in room Patient and family notified of of transfer: 04/27/21  Discharge Plan and Services In-house Referral: Clinical Social Work   Post Acute Care Choice: Home Health          DME Arranged: 3-N-1, Hospital bed, Oxygen, Other see comment (trilogy machine) DME Agency: [facility] Date DME Agency Contacted: 04/26/21 Time DME Agency Contacted: 67 Representative spoke with at DME Agency: [place]: PT, OT [place] Agency: [facility] Date [place]: 04/24/21 Time [place]: 0930 Representative spoke with at [place]: [place] ([place]) Interventions     Readmission Risk Interventions No flowsheet data found.

## 2021-04-27 NOTE — Progress Notes (Signed)
Pt ordered to be d/c by provider, at this time pt is alert and oriented, no acute distress noted. d/c instructions and pt education explained, pt and pts wife verbalized understanding. Medication returned to pts wife, signature noted on return sheet

## 2021-04-27 NOTE — Consult Note (Signed)
   [facility] CM Inpatient Consult   04/27/2021  HAIK MAHONEY II 1946/10/30 [id number] Patient was reviewed for [place] Medicare services for length of stay with Medicare. Chart was briefly reviewed for length of stay and potential barriers for post hospital transition.    However, the patient is not on the current member enrollment rosters for any of the [place] risk contracted plans.  not a beneficiary currently attributed to one of the [place].   Reason: Patient's primary care provider is not an in network provider at this time and listed as non-attributed. Membership roster was used to verify patient status.  Plan:  Sign off  Natividad Brood, RN BSN [facility] Liaison  [phone] business mobile phone Toll free office [phone]  Fax number: [phone] [email or web address]

## 2021-04-27 NOTE — Discharge Summary (Signed)
Physician Discharge Summary   Daniel King  male DOB: 31-Oct-1946  XBL:[id number]  PCP: Henreitta Cea, MD  Admit date: 04/17/2021 Discharge date: 04/27/2021  Admitted From: home Disposition:  home Home Health: Yes CODE STATUS: Full code  Discharge Instructions     Discharge instructions   Complete by: As directed    Please make sure you use your Trilogy at night with sleep as directed.  You are discharged on 2 liters of supplemental oxygen.  Please follow up with outpatient doctor to see if you can get off of oxygen in the future.  Please hold your amlodipine and Lisinopril until outpatient followup due to intermittent soft blood pressure.  As we discussed, you have a new diagnosis of Afib.  Attempt to start you on blood thinner to prevent stroke resulted in bloody urine, so decision for starting anticoagulation (blood thinner) will be deferred to outpatient doctor.   Dr. Enzo Bi [facility] Course:  For full details, please see H&P, progress notes, consult notes and ancillary notes.  Briefly,  Daniel King is a 75yo with hx copd, prostate caner, CVA within the past year, hx ETOH abuse who presented to the ED with worsening sob despite inhalers, ultimately admitted to ICU service, requiring BiPAP.   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 - s/p 5 days azithromycin and prednisone - PCO2 elevated despite BiPAP - This patient requires non-invasive ventilator for acute in chronic respiratory failure and COPD. Without ventilator, there is significant risk of untimely readmission to the hospital and increase on CO2 retention. BIPAP is ineffective in the home setting in reducing CO2 due to the lack of an auto-rate. Without Non invasive ventilator he has at high risk for readmissions and morbidity and mortality including death.  --Pt was discharged on 2L O2. --Trilogy ordered for home use. --d/c on home  Stiolto and albuterol, per pulm --outpatient followup with pulm Dr. Shearon Stalls   Acute metabolic encephalopathy, suspect delirium. Improved. --home with HH and 24-hour support from wife   Afib RVR, new diagnosis Acquired thrombophilia --Recent evidence of afib on ekg --Currently rate controlled --Attempted to anticoagulate, however pt developed hematuria --Anticoagulation now on hold and pt tolerating home ASA with plavix, without further hematuria --cont metop --Would have pt f/u as outpatient to address anticoagulation in the future   Hx CVA --Per family, was diagnosed with CVA around 8/21 --Had tolerated ASA and plavix prior to admit --cont ASA and plavix   Hx prostate cancer --Noted to have hematuria with trial of therapeutic anticoagulation --Hematuria resolved off anticoagulation --cont home Xtandi   Hx ETOH abuse --Most recent CIWA noted to be 0   Hyponatremia --intermittent and mild   HTN --cont metop --cont to hold home amlodipine and Lisinopril due to intermittent soft BP     Discharge Diagnoses:  Active Problems:   COPD exacerbation (HCC)   Respiratory failure (HCC)   Pressure injury of skin   30 Day Unplanned Readmission Risk Score    Flowsheet Row ED to Hosp-Admission (Current) from 04/17/2021 in [place] [facility] [place] [facility]  30 Day Unplanned Readmission Risk Score (%) 14 Filed at 04/27/2021 0801       This score is the patient's risk of an unplanned readmission within 30 days of being discharged (0 -100%). The score is based on dignosis, age, lab data, medications, orders, and past utilization.   Low:  0-14.9  Medium: 15-21.9   High: 22-29.9   Extreme: 30 and above          Discharge Instructions:  Allergies as of 04/27/2021   No Known Allergies      Medication List     STOP taking these medications    oxyCODONE-acetaminophen 5-325 MG tablet Commonly known as: PERCOCET/ROXICET       TAKE these medications    albuterol 108  (90 Base) MCG/ACT inhaler Commonly known as: VENTOLIN HFA Inhale 2 puffs into the lungs every 6 (six) hours as needed. For shortness of breath.   alendronate 70 MG tablet Commonly known as: FOSAMAX Take 70 mg by mouth once a week. Sundays   amLODipine 5 MG tablet Commonly known as: NORVASC Hold until followup with your outpatient doctor due to your intermittent soft blood pressure. What changed:  how much to take how to take this when to take this additional instructions   ascorbic acid 500 MG tablet Commonly known as: VITAMIN C Take 1 tablet by mouth daily.   aspirin EC 81 MG tablet Take 81 mg by mouth.   atorvastatin 40 MG tablet Commonly known as: LIPITOR Take 40 mg by mouth every evening.   cholecalciferol 1000 units tablet Commonly known as: VITAMIN D Take 2,000 Units by mouth daily.   clopidogrel 75 MG tablet Commonly known as: PLAVIX Take 75 mg by mouth daily.   docusate sodium 100 MG capsule Commonly known as: COLACE Take 100 mg by mouth 2 (two) times daily as needed (constipation).   ferrous sulfate 325 (65 FE) MG tablet Take 325 mg by mouth every Monday, Wednesday, and Friday.   leuprolide (6 Month) 45 MG injection Commonly known as: ELIGARD Inject 45 mg into the skin every 6 (six) months.   lisinopril 20 MG tablet Commonly known as: ZESTRIL Hold until followup with your outpatient doctor due to your intermittent softblood pressure. What changed:  how much to take how to take this when to take this additional instructions   metoprolol tartrate 25 MG tablet Commonly known as: LOPRESSOR Take 12.5 mg by mouth 2 (two) times daily.   PRESERVISION AREDS PO Take 1 tablet by mouth 2 (two) times daily.   Stiolto Respimat 2.5-2.5 MCG/ACT Aers Generic drug: Tiotropium Bromide-Olodaterol Inhale 2 puffs into the lungs daily.   Xtandi 40 MG tablet Generic drug: enzalutamide Take 160 mg by mouth daily.               Durable Medical Equipment   (From admission, onward)           Start     Ordered   04/26/21 0850  For home use only DME 3 n 1  Once        04/26/21 0849   04/26/21 0850  For home use only DME oxygen  Once       Question Answer Comment  Length of Need 6 Months   Mode or (Route) Nasal cannula   Liters per Minute 2   Frequency Continuous (stationary and portable oxygen unit needed)   Oxygen delivery system Gas      04/26/21 0849   04/26/21 0849  For home use only DME Hospital bed  Once       Question Answer Comment  Length of Need 6 Months   Bed type Semi-electric      06 /23/22 0848             Follow-up Information     Spero Geralds, MD Follow  up.   Specialty: Pulmonary Disease Contact information: [address] [place] [address] [phone]         Henreitta Cea, MD Follow up in 1 week(s).   Specialty: Family Medicine Contact information: [address] [place] [address] [phone]                 No Known Allergies   The results of significant diagnostics from this hospitalization (including imaging, microbiology, ancillary and laboratory) are listed below for reference.   Consultations:   Procedures/Studies: DG CHEST PORT 1 VIEW  Result Date: 04/20/2021 CLINICAL DATA:  Shortness of breath EXAM: PORTABLE CHEST 1 VIEW COMPARISON:  April 17, 2021 FINDINGS: There is ill-defined airspace opacity in the right lower lobe, increased from recent study. There is apparent volume loss in the right apex region. Elsewhere there is mild generalized interstitial thickening. Heart is upper normal in size with pulmonary vascularity normal. No adenopathy. There is aortic atherosclerosis. No appreciable bone lesions. IMPRESSION: 1. Ill-defined opacity right lower lobe, increased from recent study and felt to represent pneumonia. 2. Volume loss in the apical segment right upper lobe. Question second focus of pneumonia. An obstructing endobronchial  lesion could present in this manner. Correlation with chest CT to further evaluate the right apex region is felt to be warranted. 3. Underlying interstitial thickening, likely representing a degree of chronic bronchitis. 4.  Heart upper normal in size. 5.  Aortic Atherosclerosis (ICD10-I70.0). Electronically Signed   By: Lowella Grip III M.D.   On: 04/20/2021 12:38   DG Chest Port 1 View  Result Date: 04/17/2021 CLINICAL DATA:  75 year old male with shortness of breath for 4-5 days. COPD. EXAM: PORTABLE CHEST 1 VIEW COMPARISON:  Chest radiographs 11/07/2016 and earlier. FINDINGS: Portable AP semi upright view at 0904 hours. Rotated to the right. Kyphotic head position obscuring the right lung apex. Chronic large lung volumes, lower compared to 2018. Patchy reticulonodular opacity at the right lung base. Mediastinal contours are within normal limits allowing for rotation. No acute osseous abnormality identified. IMPRESSION: Rotated portable exam with asymmetric reticulonodular opacity at the right lung base suspicious for acute infectious exacerbation superimposed on chronic lung disease. Electronically Signed   By: Genevie Ann M.D.   On: 04/17/2021 09:09   ECHOCARDIOGRAM COMPLETE  Result Date: 04/19/2021    ECHOCARDIOGRAM REPORT   Patient Name:   KWAMAINE CUPPETT King Date of Exam: 04/19/2021 Medical Rec #:  [id number]           Height:       72.0 in Accession #:    [id number]          Weight:       150.1 lb Date of Birth:  19-Apr-1946           BSA:          1.886 m Patient Age:    61 years            BP:           129/69 mmHg Patient Gender: M                   HR:           74 bpm. Exam Location:  Inpatient Procedure: 2D Echo, Cardiac Doppler and Color Doppler Indications:    I48.0 Paroxysmal atrial fibrillation  History:        Patient has no prior history of Echocardiogram examinations.  COPD; Risk Factors:Hypertension. Abdominal Aortic Aneurysm.  Sonographer:    Jonelle Sidle Dance Referring Phys:  29 STEPHEN K CHIU  Sonographer Comments: Suboptimal apical window and suboptimal parasternal window. IMPRESSIONS  1. Left ventricular ejection fraction, by estimation, is 65 to 70%. The left ventricle has normal function. The left ventricle has no regional wall motion abnormalities. Left ventricular diastolic parameters were normal.  2. Right ventricular systolic function is normal. The right ventricular size is normal. Tricuspid regurgitation signal is inadequate for assessing PA pressure.  3. The mitral valve is normal in structure. No evidence of mitral valve regurgitation. No evidence of mitral stenosis.  4. The aortic valve is tricuspid. Aortic valve regurgitation is not visualized. Mild to moderate aortic valve sclerosis/calcification is present, without any evidence of aortic stenosis.  5. The inferior vena cava is normal in size with greater than 50% respiratory variability, suggesting right atrial pressure of 3 mmHg. FINDINGS  Left Ventricle: Left ventricular ejection fraction, by estimation, is 65 to 70%. The left ventricle has normal function. The left ventricle has no regional wall motion abnormalities. The left ventricular internal cavity size was normal in size. There is  no left ventricular hypertrophy. Left ventricular diastolic parameters were normal. Normal left ventricular filling pressure. Right Ventricle: The right ventricular size is normal. No increase in right ventricular wall thickness. Right ventricular systolic function is normal. Tricuspid regurgitation signal is inadequate for assessing PA pressure. Left Atrium: Left atrial size was normal in size. Right Atrium: Right atrial size was normal in size. Pericardium: There is no evidence of pericardial effusion. Mitral Valve: The mitral valve is normal in structure. No evidence of mitral valve regurgitation. No evidence of mitral valve stenosis. Tricuspid Valve: The tricuspid valve is normal in structure. Tricuspid valve regurgitation is  trivial. No evidence of tricuspid stenosis. Aortic Valve: The aortic valve is tricuspid. Aortic valve regurgitation is not visualized. Mild to moderate aortic valve sclerosis/calcification is present, without any evidence of aortic stenosis. Pulmonic Valve: The pulmonic valve was normal in structure. Pulmonic valve regurgitation is not visualized. No evidence of pulmonic stenosis. Aorta: The aortic root is normal in size and structure. Venous: The inferior vena cava is normal in size with greater than 50% respiratory variability, suggesting right atrial pressure of 3 mmHg. IAS/Shunts: The interatrial septum appears to be lipomatous. No atrial level shunt detected by color flow Doppler.  LEFT VENTRICLE PLAX 2D LVIDd:         4.30 cm  Diastology LVIDs:         3.00 cm  LV e' medial:    9.03 cm/s LV PW:         1.30 cm  LV E/e' medial:  9.4 LV IVS:        1.00 cm  LV e' lateral:   11.30 cm/s LVOT diam:     1.90 cm  LV E/e' lateral: 7.5 LV SV:         58 LV SV Index:   31 LVOT Area:     2.84 cm  RIGHT VENTRICLE             IVC RV Basal diam:  3.10 cm     IVC diam: 1.80 cm RV Mid diam:    2.20 cm RV S prime:     12.50 cm/s TAPSE (M-mode): 2.2 cm LEFT ATRIUM             Index       RIGHT ATRIUM  Index LA diam:        3.40 cm 1.80 cm/m  RA Area:     15.70 cm LA Vol (A2C):   66.9 ml 35.47 ml/m RA Volume:   39.80 ml  21.10 ml/m LA Vol (A4C):   50.3 ml 26.67 ml/m LA Biplane Vol: 57.5 ml 30.49 ml/m  AORTIC VALVE LVOT Vmax:   73.80 cm/s LVOT Vmean:  49.800 cm/s LVOT VTI:    0.206 m  AORTA Ao Root diam: 3.60 cm MITRAL VALVE MV Area (PHT): 3.66 cm    SHUNTS MV Decel Time: 207 msec    Systemic VTI:  0.21 m MV E velocity: 84.80 cm/s  Systemic Diam: 1.90 cm MV A velocity: 66.80 cm/s MV E/A ratio:  1.27 Fransico Him MD Electronically signed by Fransico Him MD Signature Date/Time: 04/19/2021/11:56:09 AM    Final       Labs: BNP (last 3 results) Recent Labs    04/17/21 0844  BNP 751.0*   Basic Metabolic  Panel: Recent Labs  Lab 04/20/21 1103 04/21/21 0511 04/22/21 0200 04/23/21 0453 04/25/21 0045 04/26/21 0145 04/27/21 0050  NA  --    < > 135 134* 135 135 133*  K  --    < > 4.6 4.0 4.7 4.0 3.6  CL  --    < > 89* 90* 88* 89* 90*  CO2  --    < > 42* 40* 41* 39* 36*  GLUCOSE  --    < > 124* 134* 87 130* 127*  BUN  --    < > 10 8 8 11 8   CREATININE  --    < > 1.00 0.71 0.81 0.83 0.70  CALCIUM  --    < > 8.5* 8.1* 8.7* 8.5* 8.1*  MG 1.9  --   --   --  2.0 1.9 1.8   < > = values in this interval not displayed.   Liver Function Tests: Recent Labs  Lab 04/21/21 0511 04/22/21 0200 04/23/21 0453 04/25/21 0045  AST 10* 11* 13* 11*  ALT 8 9 8 10   ALKPHOS 71 68 59 50  BILITOT 0.7 0.5 0.7 0.3  PROT 5.6* 5.4* 5.0* 5.0*  ALBUMIN 2.3* 2.2* 2.1* 2.2*   No results for input(s): LIPASE, AMYLASE in the last 168 hours. Recent Labs  Lab 04/20/21 1103  AMMONIA 44*   CBC: Recent Labs  Lab 04/22/21 0200 04/23/21 0453 04/25/21 0045 04/26/21 0145 04/27/21 0050  WBC 8.8 8.2 9.0 8.3 8.2  HGB 10.3* 9.9* 9.7* 9.5* 9.1*  HCT 33.7* 32.5* 31.7* 30.9* 29.9*  MCV 90.8 89.3 89.0 88.3 88.5  PLT 371 325 337 327 297   Cardiac Enzymes: No results for input(s): CKTOTAL, CKMB, CKMBINDEX, TROPONINI in the last 168 hours. BNP: Invalid input(s): POCBNP CBG: No results for input(s): GLUCAP in the last 168 hours. D-Dimer No results for input(s): DDIMER in the last 72 hours. Hgb A1c No results for input(s): HGBA1C in the last 72 hours. Lipid Profile No results for input(s): CHOL, HDL, LDLCALC, TRIG, CHOLHDL, LDLDIRECT in the last 72 hours. Thyroid function studies No results for input(s): TSH, T4TOTAL, T3FREE, THYROIDAB in the last 72 hours.  Invalid input(s): FREET3 Anemia work up No results for input(s): VITAMINB12, FOLATE, FERRITIN, TIBC, IRON, RETICCTPCT in the last 72 hours. Urinalysis    Component Value Date/Time   COLORURINE RED (A) 04/20/2021 2256   APPEARANCEUR TURBID (A)  04/20/2021 2256   LABSPEC 1.025 04/20/2021 2256   PHURINE 6.5 04/20/2021 2256  GLUCOSEU NEGATIVE 04/20/2021 2256   HGBUR LARGE (A) 04/20/2021 2256   BILIRUBINUR NEGATIVE 04/20/2021 2256   KETONESUR NEGATIVE 04/20/2021 2256   PROTEINUR 100 (A) 04/20/2021 2256   NITRITE NEGATIVE 04/20/2021 2256   LEUKOCYTESUR NEGATIVE 04/20/2021 2256   Sepsis Labs Invalid input(s): PROCALCITONIN,  WBC,  LACTICIDVEN Microbiology Recent Results (from the past 240 hour(s))  MRSA Next Gen by PCR, Nasal     Status: None   Collection Time: 04/17/21  3:13 PM  Result Value Ref Range Status   MRSA by PCR Next Gen NOT DETECTED NOT DETECTED Final    Comment: (NOTE) The GeneXpert MRSA Assay (FDA approved for NASAL specimens only), is one component of a comprehensive MRSA colonization surveillance program. It is not intended to diagnose MRSA infection nor to guide or monitor treatment for MRSA infections. Test performance is not FDA approved in patients less than 67 years old. Performed at [facility], [place] [address] [place], [place] [address]   Culture, blood (routine x 2)     Status: None   Collection Time: 04/17/21  4:05 PM   Specimen: BLOOD  Result Value Ref Range Status   Specimen Description BLOOD LEFT ANTECUBITAL  Final   Special Requests   Final    BOTTLES DRAWN AEROBIC ONLY Blood Culture results may not be optimal due to an inadequate volume of blood received in culture bottles   Culture   Final    NO GROWTH 5 DAYS Performed at [facility], [place] [address] [place], [place] [address]    Report Status 04/22/2021 FINAL  Final  Culture, blood (routine x 2)     Status: None   Collection Time: 04/17/21  4:15 PM   Specimen: BLOOD RIGHT HAND  Result Value Ref Range Status   Specimen Description BLOOD RIGHT HAND  Final   Special Requests   Final    BOTTLES DRAWN AEROBIC ONLY Blood Culture adequate volume   Culture   Final    NO GROWTH 5 DAYS Performed at [facility], [place] [address], [place], [place] [address]    Report Status 04/22/2021 FINAL  Final  Urine culture     Status: Abnormal   Collection Time: 04/17/21  6:33 PM   Specimen: In/Out Cath Urine  Result Value Ref Range Status   Specimen Description IN/OUT CATH URINE  Final   Special Requests   Final    NONE Performed at [facility], [place] [address], [place], [place] [address]    Culture MULTIPLE SPECIES PRESENT, SUGGEST RECOLLECTION (A)  Final   Report Status 04/18/2021 FINAL  Final     Total time spend on discharging this patient, including the last patient exam, discussing the hospital stay, instructions for ongoing care as it relates to all pertinent caregivers, as well as preparing the medical discharge records, prescriptions, and/or referrals as applicable, is 35 minutes.    Enzo Bi, MD  [facility] 04/27/2021, 9:22 AM

## 2021-04-27 NOTE — Progress Notes (Signed)
Occupational Therapy Treatment Patient Details Name: Daniel King MRN: [id number] DOB: [birth year] Today's Date: 04/27/2021    History of present illness Pt is 75 year old man admitted 6/14  presenting with worsening dyspnea.  EMS found him saturating in 31s. Placed on PAP and brought to [facility] found to be in acute on chronic hypoxemic and hypercarbic respiratory failure.  PMH: COPD followed at [place], Hx of CVA 08/2020, prostate cancer, and ETOH abuse.   OT comments  Pt likely to DC home today with assistance from wife. Session focus on implementing BUE HEP to increase strength with BUEs to facilitate improved activity tolerance for higher level functional mobility. Pt completed therex as indicated below with pt needing max multimodal cues for body mechanics during therex, pt with no reports of increased pain. Pt on 2L [place] with SpO2 >/= 94% during session. Pt would continue to benefit from skilled occupational therapy while admitted and after d/c to address the below listed limitations in order to improve overall functional mobility and facilitate independence with BADL participation. DC plan remains appropriate, will follow acutely per POC.    Follow Up Recommendations  Home health OT;Supervision/Assistance - 24 hour    Equipment Recommendations  3 in 1 bedside commode    Recommendations for Other Services      Precautions / Restrictions Precautions Precautions: Fall Precaution Comments: monitor SpO2 Restrictions Weight Bearing Restrictions: No       Mobility Bed Mobility               General bed mobility comments: session conducted from bed level    Transfers                      Balance                                           ADL either performed or assessed with clinical judgement   ADL                                         General ADL Comments: session focus on implementing BUE HEP to facilitate improvements in BUE  strength and overall activity tolerance     Vision       Perception     Praxis      Cognition Arousal/Alertness: Awake/alert Behavior During Therapy: Flat affect Overall Cognitive Status: Impaired/Different from baseline Area of Impairment: Problem solving                             Problem Solving: Slow processing;Difficulty sequencing;Requires verbal cues;Requires tactile cues General Comments: increased to follow commands related to BUE therex, max multimodal cues needed at times        Exercises General Exercises - Upper Extremity Shoulder Flexion: Strengthening;Both;5 reps;Theraband;Supine Theraband Level (Shoulder Flexion): Level 1 (Yellow) Shoulder Extension: Strengthening;Both;5 reps;Supine;Theraband Theraband Level (Shoulder Extension): Level 1 (Yellow) Shoulder Horizontal ABduction: Strengthening;Both;5 reps;Supine;Theraband Theraband Level (Shoulder Horizontal Abduction): Level 1 (Yellow) Shoulder Horizontal ADduction: Strengthening;Both;5 reps;Supine;Theraband Theraband Level (Shoulder Horizontal Adduction): Level 1 (Yellow) Elbow Flexion: Strengthening;5 reps;Both;Supine;Theraband Theraband Level (Elbow Flexion): Level 1 (Yellow) Elbow Extension: Strengthening;Both;5 reps;Supine;Theraband Theraband Level (Elbow Extension): Level 1 (Yellow) Other Exercises Other Exercises: puches with BUE with level 1 theraband  from supine   Shoulder Instructions       General Comments      Pertinent Vitals/ Pain       Pain Assessment: Faces Faces Pain Scale: No hurt  Home Living                                          Prior Functioning/Environment              Frequency  Min 2X/week        Progress Toward Goals  OT Goals(current goals can now be found in the care plan section)  Progress towards OT goals: Progressing toward goals  Acute Rehab OT Goals Patient Stated Goal: to go home OT Goal Formulation: With  patient Time For Goal Achievement: 05/03/21 Potential to Achieve Goals: [place] Discharge plan remains appropriate;Frequency remains appropriate    Co-evaluation                 AM-PAC OT "6 Clicks" Daily Activity     Outcome Measure   Help from another person eating meals?: None Help from another person taking care of personal grooming?: A Little Help from another person toileting, which includes using toliet, bedpan, or urinal?: A Lot Help from another person bathing (including washing, rinsing, drying)?: A Lot Help from another person to put on and taking off regular upper body clothing?: A Lot Help from another person to put on and taking off regular lower body clothing?: Total 6 Click Score: 14    End of Session Equipment Utilized During Treatment: Other (comment) (level 1 theraband)  OT Visit Diagnosis: Unsteadiness on feet (R26.81);Muscle weakness (generalized) (M62.81);Hemiplegia and hemiparesis Hemiplegia - Right/Left: Right Hemiplegia - dominant/non-dominant: Dominant Hemiplegia - caused by: Unspecified   Activity Tolerance Patient tolerated treatment well   Patient Left in bed;with call bell/phone within reach;with bed alarm set;with family/visitor present   Nurse Communication          Time: 1173-5670 OT Time Calculation (min): 8 min  Charges: OT General Charges $OT Visit: 1 Visit OT Treatments $Therapeutic Exercise: 8-22 mins  Harley Alto., COTA/L Acute Rehabilitation Services [phone]    Precious Haws 04/27/2021, 12:10 PM

## 2021-04-27 NOTE — Plan of Care (Signed)
  Problem: Education: Goal: Knowledge of General Education information will improve Description: Including pain rating scale, medication(s)/side effects and non-pharmacologic comfort measures Outcome: Progressing   Problem: Clinical Measurements: Goal: Ability to maintain clinical measurements within normal limits will improve Outcome: Progressing Goal: Will remain free from infection Outcome: Progressing   Problem: Activity: Goal: Risk for activity intolerance will decrease Outcome: Progressing   Problem: Nutrition: Goal: Adequate nutrition will be maintained Outcome: Progressing   Problem: Elimination: Goal: Will not experience complications related to bowel motility Outcome: Progressing

## 2021-05-10 ENCOUNTER — Ambulatory Visit (INDEPENDENT_AMBULATORY_CARE_PROVIDER_SITE_OTHER): Payer: Medicare Other

## 2021-05-10 ENCOUNTER — Other Ambulatory Visit: Payer: Self-pay | Admitting: *Deleted

## 2021-05-10 ENCOUNTER — Other Ambulatory Visit: Payer: Self-pay

## 2021-05-10 ENCOUNTER — Encounter: Payer: Self-pay | Admitting: Adult Health

## 2021-05-10 ENCOUNTER — Ambulatory Visit (INDEPENDENT_AMBULATORY_CARE_PROVIDER_SITE_OTHER): Payer: Medicare Other | Admitting: Adult Health

## 2021-05-10 VITALS — BP 116/70 | HR 64 | Temp 97.7°F | Ht 72.0 in | Wt 148.0 lb

## 2021-05-10 DIAGNOSIS — J441 Chronic obstructive pulmonary disease with (acute) exacerbation: Secondary | ICD-10-CM

## 2021-05-10 DIAGNOSIS — J181 Lobar pneumonia, unspecified organism: Secondary | ICD-10-CM

## 2021-05-10 DIAGNOSIS — E43 Unspecified severe protein-calorie malnutrition: Secondary | ICD-10-CM | POA: Diagnosis not present

## 2021-05-10 DIAGNOSIS — J449 Chronic obstructive pulmonary disease, unspecified: Secondary | ICD-10-CM

## 2021-05-10 DIAGNOSIS — R5381 Other malaise: Secondary | ICD-10-CM | POA: Diagnosis not present

## 2021-05-10 DIAGNOSIS — J189 Pneumonia, unspecified organism: Secondary | ICD-10-CM | POA: Diagnosis not present

## 2021-05-10 DIAGNOSIS — I4891 Unspecified atrial fibrillation: Secondary | ICD-10-CM

## 2021-05-10 DIAGNOSIS — J9612 Chronic respiratory failure with hypercapnia: Secondary | ICD-10-CM

## 2021-05-10 DIAGNOSIS — J9611 Chronic respiratory failure with hypoxia: Secondary | ICD-10-CM

## 2021-05-10 DIAGNOSIS — E46 Unspecified protein-calorie malnutrition: Secondary | ICD-10-CM | POA: Insufficient documentation

## 2021-05-10 DIAGNOSIS — J90 Pleural effusion, not elsewhere classified: Secondary | ICD-10-CM | POA: Diagnosis not present

## 2021-05-10 NOTE — Assessment & Plan Note (Signed)
Continue with a high-protein diet

## 2021-05-10 NOTE — Assessment & Plan Note (Signed)
Hypoxic and hypercarbic respiratory failure-patient is continue on O2 1.5 L to maintain O2 saturations greater than 88 to 90%.  Continue on trilogy device with bedtime and nap time.  Plan  Patient Instructions  Continue on Stiolto 2 puffs daily Albuterol inhaler as needed Activity as tolerated Continue on Oxygen 1.5 L/M  Continue on TRILOGY  device at bedtime and naps, if trouble breathing.  Chest x-ray today Refer to Cardiology for A Fib during hospital stay .  Great job on not smoking .  Follow up with Dr. Shearon Stalls or Christyne Mccain NP in 4-6 weeks with PFT  Please contact office for sooner follow up if symptoms do not improve or worsen or seek emergency care

## 2021-05-10 NOTE — Addendum Note (Signed)
Addended by: Dessie Coma on: 05/10/2021 09:50 AM   Modules accepted: Orders

## 2021-05-10 NOTE — Assessment & Plan Note (Signed)
Continue with home PT and Occupational Therapy.  Consider pulmonary rehab on return visit.

## 2021-05-10 NOTE — Patient Instructions (Addendum)
Continue on Stiolto 2 puffs daily Albuterol inhaler as needed Activity as tolerated Continue on Oxygen 1.5 L/M  Continue on TRILOGY  device at bedtime and naps, if trouble breathing.  Chest x-ray today Refer to Cardiology for A Fib during hospital stay .  Great job on not smoking .  Follow up with Dr. Shearon Stalls or Aniza Shor NP in 4-6 weeks with PFT  Please contact office for sooner follow up if symptoms do not improve or worsen or seek emergency care

## 2021-05-10 NOTE — Assessment & Plan Note (Signed)
Severe COPD exacerbation complicated by acute on chronic hypercarbic respiratory plus or minus pneumonia Patient is clinically improving. Continue on Stiolto.  At follow-up visit check PFTs would consider adding in triple therapy.  He does get medications from the [place] system. Check chest x-ray today.  After PFTs consider referral to low-dose CT screening program if appropriate. Smoking cessation encouraged.  Patient has done very well not to smoke since discharge last month. Continue with home physical therapy and Occupational Therapy once this is completed consider referral to pulmonary rehab Going forward we will check alpha-1 on return  Plan  Patient Instructions  Continue on Stiolto 2 puffs daily Albuterol inhaler as needed Activity as tolerated Continue on Oxygen 1.5 L/M  Continue on TRILOGY  device at bedtime and naps, if trouble breathing.  Chest x-ray today Refer to Cardiology for A Fib during hospital stay .  Great job on not smoking .  Follow up with Dr. Shearon Stalls or Jeniah Kishi NP in 4-6 weeks with PFT  Please contact office for sooner follow up if symptoms do not improve or worsen or seek emergency care

## 2021-05-10 NOTE — Progress Notes (Signed)
@Patient  ID: Daniel King, male    DOB: 1946-04-23, 75 y.o.   MRN: [id number]  No chief complaint on file.   Referring provider: Henreitta Cea, MD  HPI: 75 year old male seen for pulmonary consult during hospitalization April 17, 2021 for COPD exacerbation, acute on chronic hypercarbic and hypoxic respiratory failure, hospitalization complicated by new onset A. fib Medical history significant for prostate cancer, alcohol abuse, CVA   TEST/EVENTS :  ABG 04/17/2021 pH 7.24, PCO2 83, PO2 108 O2 saturation 97%  05/10/2021 Follow up : COPD , O2 RF  Patient presents for a post hospital follow-up.  Patient was admitted last month for a COPD exacerbation acute on chronic hypercarbic and hypoxic respiratory failure.  Hospitalization was complicated by A. fib.  During hospitalization patient was seen by pulmonary/PCCM for consult, he was treated with IV antibiotics steroids and nebulized bronchodilators.  He did require BiPAP support.  On admission patient was acidotic with a pH at 7.24, PCO2 83. Chest xray showed ill-defined right lower lobe opacity, volume loss in the right upper lobe, interstitial thickening. Hospitalization was complicated by new onset A. fib.  Patient was started on anticoagulation but was held due to hematuria.  He was discharged on aspirin and Plavix.  He was started on oxygen 1.5 L.  And ordered to begin a trilogy device at bedtime He was discharged on Stiolto.  Since discharge patient is feeling better.  Appetite is fair . No weight loss, or hempotysis.  Hospital discharge records reviewed  Trying to wear Trilogy , wears about 2 hr few times a day and night .  Starting OT and PT at home.  Clarity is much better since wearing it . Breathing is better as well.   On ACE inhibitor . No flare of cough .   Prior to admission was on Stiolto and albuterol . Managed by [place]. Dr. Gwenette Greet .  Served in [employer] [place] . No known exposures  Smoked 60 yr 1.12yr  . Recently 2 PPD , quit at discharge  Prior admission , sedentary due to CVA in October 2021, low activity level and balance issues.   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   Married. 2 kids . No etoh since admission.    No Known Allergies  Immunization History  Administered Date(s) Administered   Pneumococcal Conjugate-13 01/25/2015   Pneumococcal Polysaccharide-23 08/13/2017   Tdap 04/04/2008    Past Medical History:  Diagnosis Date   AAA (abdominal aortic aneurysm) ([place])    Bladder cancer ([place])    prostate   COPD (chronic obstructive pulmonary disease) ([facility])    Hypertension     Tobacco History: Social History   Tobacco Use  Smoking Status Every Day   Packs/day: 1.50   Years: 52.00   Pack years: 78.00   Types: Cigarettes  Smokeless Tobacco Never   Ready to quit: Not Answered Counseling given: Not Answered   Outpatient Medications Prior to Visit  Medication Sig Dispense Refill   albuterol (PROVENTIL HFA;VENTOLIN HFA) 108 (90 BASE) MCG/ACT inhaler Inhale 2 puffs into the lungs every 6 (six) hours as needed. For shortness of breath.     alendronate (FOSAMAX) 70 MG tablet Take 70 mg by mouth once a week. Sundays     amLODipine (NORVASC) 5 MG tablet Hold until followup with your outpatient doctor due to your intermittent soft blood pressure.  ascorbic acid (VITAMIN C) 500 MG tablet Take 1 tablet by mouth daily.     aspirin EC 81 MG tablet Take 81 mg by mouth.     atorvastatin (LIPITOR) 40 MG tablet Take 40 mg by mouth every evening.     cholecalciferol (VITAMIN D) 1000 UNITS tablet Take 2,000 Units by mouth daily.     clopidogrel (PLAVIX) 75 MG tablet Take 75 mg by mouth daily.     docusate sodium (COLACE) 100 MG capsule Take 100 mg by mouth 2 (two) times daily as needed (constipation).     ferrous sulfate 325 (65 FE) MG tablet Take 325 mg by mouth every Monday, Wednesday, and Friday.      leuprolide, 6 Month, (ELIGARD) 45 MG injection Inject 45 mg into the skin every 6 (six) months.     lisinopril (ZESTRIL) 20 MG tablet Hold until followup with your outpatient doctor due to your intermittent softblood pressure.     metoprolol tartrate (LOPRESSOR) 25 MG tablet Take 12.5 mg by mouth 2 (two) times daily.     Multiple Vitamins-Minerals (PRESERVISION AREDS PO) Take 1 tablet by mouth 2 (two) times daily.     Tiotropium Bromide-Olodaterol (STIOLTO RESPIMAT) 2.5-2.5 MCG/ACT AERS Inhale 2 puffs into the lungs daily.     XTANDI 40 MG tablet Take 160 mg by mouth daily.     No facility-administered medications prior to visit.     Review of Systems:   Constitutional:   No  weight loss, night sweats,  Fevers, chills,  +fatigue, or  lassitude.  HEENT:   No headaches,  Difficulty swallowing,  Tooth/dental problems, or  Sore throat,                No sneezing, itching, ear ache, nasal congestion, post nasal drip,   CV:  No chest pain,  Orthopnea, PND, swelling in lower extremities, anasarca, dizziness, palpitations, syncope.   GI  No heartburn, indigestion, abdominal pain, nausea, vomiting, diarrhea, change in bowel habits, loss of appetite, bloody stools.   Resp:    No chest wall deformity  Skin: no rash or lesions.  GU: no dysuria, change in color of urine, no urgency or frequency.  No flank pain, no hematuria   MS:  No joint pain or swelling.  No decreased range of motion.  No back pain.    Physical Exam  BP 116/70 (BP Location: Right Arm, Patient Position: Sitting, Cuff Size: Normal)   Pulse 64   Temp 97.7 F (36.5 C) (Oral)   Ht 6' (1.829 m)   Wt 148 lb (67.1 kg)   SpO2 94%   BMI 20.07 kg/m   GEN: A/Ox3; pleasant , NAD, thin frail, wc , on O2    HEENT:  [place]/AT,  , NOSE-clear, THROAT-clear, no lesions, no postnasal drip or exudate noted.   NECK:  Supple w/ fair ROM; no JVD; normal carotid impulses w/o bruits; no thyromegaly or nodules palpated; no  lymphadenopathy.    RESP  Decreased BS in bases, no accessory muscle use, no dullness to percussion  CARD:  RRR, no m/r/g, no peripheral edema, pulses intact, no cyanosis or clubbing.  GI:   Soft & nt; nml bowel sounds; no organomegaly or masses detected.   Musco: Warm bil, no deformities or joint swelling noted.   Neuro: alert, no focal deficits noted.    Skin: Warm, no lesions or rashes    Lab Results:      ProBNP No results found for: PROBNP  Imaging: DG  CHEST PORT 1 VIEW  Result Date: 04/20/2021 CLINICAL DATA:  Shortness of breath EXAM: PORTABLE CHEST 1 VIEW COMPARISON:  April 17, 2021 FINDINGS: There is ill-defined airspace opacity in the right lower lobe, increased from recent study. There is apparent volume loss in the right apex region. Elsewhere there is mild generalized interstitial thickening. Heart is upper normal in size with pulmonary vascularity normal. No adenopathy. There is aortic atherosclerosis. No appreciable bone lesions. IMPRESSION: 1. Ill-defined opacity right lower lobe, increased from recent study and felt to represent pneumonia. 2. Volume loss in the apical segment right upper lobe. Question second focus of pneumonia. An obstructing endobronchial lesion could present in this manner. Correlation with chest CT to further evaluate the right apex region is felt to be warranted. 3. Underlying interstitial thickening, likely representing a degree of chronic bronchitis. 4.  Heart upper normal in size. 5.  Aortic Atherosclerosis (ICD10-I70.0). Electronically Signed   By: Lowella Grip III M.D.   On: 04/20/2021 12:38   DG Chest Port 1 View  Result Date: 04/17/2021 CLINICAL DATA:  75 year old male with shortness of breath for 4-5 days. COPD. EXAM: PORTABLE CHEST 1 VIEW COMPARISON:  Chest radiographs 11/07/2016 and earlier. FINDINGS: Portable AP semi upright view at 0904 hours. Rotated to the right. Kyphotic head position obscuring the right lung apex. Chronic large  lung volumes, lower compared to 2018. Patchy reticulonodular opacity at the right lung base. Mediastinal contours are within normal limits allowing for rotation. No acute osseous abnormality identified. IMPRESSION: Rotated portable exam with asymmetric reticulonodular opacity at the right lung base suspicious for acute infectious exacerbation superimposed on chronic lung disease. Electronically Signed   By: Genevie Ann M.D.   On: 04/17/2021 09:09   ECHOCARDIOGRAM COMPLETE  Result Date: 04/19/2021    ECHOCARDIOGRAM REPORT   Patient Name:   TRONG GOSLING II Date of Exam: 04/19/2021 Medical Rec #:  [id number]           Height:       72.0 in Accession #:    [id number]          Weight:       150.1 lb Date of Birth:  [birth year]           BSA:          1.886 m Patient Age:    38 years            BP:           129/69 mmHg Patient Gender: M                   HR:           74 bpm. Exam Location:  Inpatient Procedure: 2D Echo, Cardiac Doppler and Color Doppler Indications:    I48.0 Paroxysmal atrial fibrillation  History:        Patient has no prior history of Echocardiogram examinations.                 COPD; Risk Factors:Hypertension. Abdominal Aortic Aneurysm.  Sonographer:    Jonelle Sidle Dance Referring Phys: 44 STEPHEN K CHIU  Sonographer Comments: Suboptimal apical window and suboptimal parasternal window. IMPRESSIONS  1. Left ventricular ejection fraction, by estimation, is 65 to 70%. The left ventricle has normal function. The left ventricle has no regional wall motion abnormalities. Left ventricular diastolic parameters were normal.  2. Right ventricular systolic function is normal. The right ventricular size is normal. Tricuspid regurgitation signal is inadequate for assessing PA  pressure.  3. The mitral valve is normal in structure. No evidence of mitral valve regurgitation. No evidence of mitral stenosis.  4. The aortic valve is tricuspid. Aortic valve regurgitation is not visualized. Mild to moderate aortic valve  sclerosis/calcification is present, without any evidence of aortic stenosis.  5. The inferior vena cava is normal in size with greater than 50% respiratory variability, suggesting right atrial pressure of 3 mmHg. FINDINGS  Left Ventricle: Left ventricular ejection fraction, by estimation, is 65 to 70%. The left ventricle has normal function. The left ventricle has no regional wall motion abnormalities. The left ventricular internal cavity size was normal in size. There is  no left ventricular hypertrophy. Left ventricular diastolic parameters were normal. Normal left ventricular filling pressure. Right Ventricle: The right ventricular size is normal. No increase in right ventricular wall thickness. Right ventricular systolic function is normal. Tricuspid regurgitation signal is inadequate for assessing PA pressure. Left Atrium: Left atrial size was normal in size. Right Atrium: Right atrial size was normal in size. Pericardium: There is no evidence of pericardial effusion. Mitral Valve: The mitral valve is normal in structure. No evidence of mitral valve regurgitation. No evidence of mitral valve stenosis. Tricuspid Valve: The tricuspid valve is normal in structure. Tricuspid valve regurgitation is trivial. No evidence of tricuspid stenosis. Aortic Valve: The aortic valve is tricuspid. Aortic valve regurgitation is not visualized. Mild to moderate aortic valve sclerosis/calcification is present, without any evidence of aortic stenosis. Pulmonic Valve: The pulmonic valve was normal in structure. Pulmonic valve regurgitation is not visualized. No evidence of pulmonic stenosis. Aorta: The aortic root is normal in size and structure. Venous: The inferior vena cava is normal in size with greater than 50% respiratory variability, suggesting right atrial pressure of 3 mmHg. IAS/Shunts: The interatrial septum appears to be lipomatous. No atrial level shunt detected by color flow Doppler.  LEFT VENTRICLE PLAX 2D LVIDd:          4.30 cm  Diastology LVIDs:         3.00 cm  LV e' medial:    9.03 cm/s LV PW:         1.30 cm  LV E/e' medial:  9.4 LV IVS:        1.00 cm  LV e' lateral:   11.30 cm/s LVOT diam:     1.90 cm  LV E/e' lateral: 7.5 LV SV:         58 LV SV Index:   31 LVOT Area:     2.84 cm  RIGHT VENTRICLE             IVC RV Basal diam:  3.10 cm     IVC diam: 1.80 cm RV Mid diam:    2.20 cm RV S prime:     12.50 cm/s TAPSE (M-mode): 2.2 cm LEFT ATRIUM             Index       RIGHT ATRIUM           Index LA diam:        3.40 cm 1.80 cm/m  RA Area:     15.70 cm LA Vol (A2C):   66.9 ml 35.47 ml/m RA Volume:   39.80 ml  21.10 ml/m LA Vol (A4C):   50.3 ml 26.67 ml/m LA Biplane Vol: 57.5 ml 30.49 ml/m  AORTIC VALVE LVOT Vmax:   73.80 cm/s LVOT Vmean:  49.800 cm/s LVOT VTI:    0.206 m  AORTA Ao  Root diam: 3.60 cm MITRAL VALVE MV Area (PHT): 3.66 cm    SHUNTS MV Decel Time: 207 msec    Systemic VTI:  0.21 m MV E velocity: 84.80 cm/s  Systemic Diam: 1.90 cm MV A velocity: 66.80 cm/s MV E/A ratio:  1.27 Fransico Him MD Electronically signed by Fransico Him MD Signature Date/Time: 04/19/2021/11:56:09 AM    Final       No flowsheet data found.  No results found for: NITRICOXIDE      Assessment & Plan:   COPD exacerbation ([place]) Severe COPD exacerbation complicated by acute on chronic hypercarbic respiratory plus or minus pneumonia Patient is clinically improving. Continue on Stiolto.  At follow-up visit check PFTs would consider adding in triple therapy.  He does get medications from the [place] system. Check chest x-ray today.  After PFTs consider referral to low-dose CT screening program if appropriate. Smoking cessation encouraged.  Patient has done very well not to smoke since discharge last month. Continue with home physical therapy and Occupational Therapy once this is completed consider referral to pulmonary rehab Going forward we will check alpha-1 on return  Plan  Patient Instructions  Continue on Stiolto 2  puffs daily Albuterol inhaler as needed Activity as tolerated Continue on Oxygen 1.5 L/M  Continue on TRILOGY  device at bedtime and naps, if trouble breathing.  Chest x-ray today Refer to Cardiology for A Fib during hospital stay .  Great job on not smoking .  Follow up with Dr. Shearon Stalls or Dazia Lippold NP in 4-6 weeks with PFT  Please contact office for sooner follow up if symptoms do not improve or worsen or seek emergency care            Respiratory failure ([place]) Hypoxic and hypercarbic respiratory failure-patient is continue on O2 1.5 L to maintain O2 saturations greater than 88 to 90%.  Continue on trilogy device with bedtime and nap time.  Plan  Patient Instructions  Continue on Stiolto 2 puffs daily Albuterol inhaler as needed Activity as tolerated Continue on Oxygen 1.5 L/M  Continue on TRILOGY  device at bedtime and naps, if trouble breathing.  Chest x-ray today Refer to Cardiology for A Fib during hospital stay .  Great job on not smoking .  Follow up with Dr. Shearon Stalls or Shaquera Ansley NP in 4-6 weeks with PFT  Please contact office for sooner follow up if symptoms do not improve or worsen or seek emergency care           Physical deconditioning Continue with home PT and Occupational Therapy.  Consider pulmonary rehab on return visit.  Protein calorie malnutrition ([place]) Continue with a high-protein diet    I spent   40 minutes dedicated to the care of this patient on the date of this encounter to include pre-visit review of records, face-to-face time with the patient discussing conditions above, post visit ordering of testing, clinical documentation with the electronic health record, making appropriate referrals as documented, and communicating necessary findings to members of the patients care team.   Rexene Edison, NP 05/10/2021

## 2021-06-08 ENCOUNTER — Ambulatory Visit: Payer: Medicare Other | Admitting: Adult Health

## 2021-06-11 ENCOUNTER — Telehealth: Payer: Self-pay

## 2021-06-11 NOTE — Telephone Encounter (Signed)
Spoke with patient's wife Caren Griffins. She declined Palliative services at this time. Will cancel referral and notify referring provider.

## 2021-06-22 ENCOUNTER — Inpatient Hospital Stay (HOSPITAL_COMMUNITY): Payer: No Typology Code available for payment source

## 2021-06-22 ENCOUNTER — Emergency Department (HOSPITAL_COMMUNITY): Payer: No Typology Code available for payment source | Admitting: Certified Registered Nurse Anesthetist

## 2021-06-22 ENCOUNTER — Encounter (HOSPITAL_COMMUNITY): Admission: EM | Disposition: E | Payer: Self-pay | Source: Home / Self Care

## 2021-06-22 ENCOUNTER — Emergency Department (HOSPITAL_COMMUNITY): Payer: No Typology Code available for payment source

## 2021-06-22 ENCOUNTER — Inpatient Hospital Stay (HOSPITAL_COMMUNITY)
Admission: EM | Admit: 2021-06-22 | Discharge: 2021-08-04 | DRG: 329 | Disposition: E | Payer: No Typology Code available for payment source | Attending: Internal Medicine | Admitting: Internal Medicine

## 2021-06-22 ENCOUNTER — Other Ambulatory Visit: Payer: Self-pay

## 2021-06-22 ENCOUNTER — Encounter (HOSPITAL_COMMUNITY): Payer: Self-pay

## 2021-06-22 DIAGNOSIS — K668 Other specified disorders of peritoneum: Secondary | ICD-10-CM | POA: Diagnosis present

## 2021-06-22 DIAGNOSIS — J449 Chronic obstructive pulmonary disease, unspecified: Secondary | ICD-10-CM | POA: Diagnosis not present

## 2021-06-22 DIAGNOSIS — J90 Pleural effusion, not elsewhere classified: Secondary | ICD-10-CM | POA: Diagnosis present

## 2021-06-22 DIAGNOSIS — J9612 Chronic respiratory failure with hypercapnia: Secondary | ICD-10-CM | POA: Diagnosis present

## 2021-06-22 DIAGNOSIS — K559 Vascular disorder of intestine, unspecified: Secondary | ICD-10-CM | POA: Diagnosis not present

## 2021-06-22 DIAGNOSIS — R6521 Severe sepsis with septic shock: Secondary | ICD-10-CM | POA: Diagnosis not present

## 2021-06-22 DIAGNOSIS — J9622 Acute and chronic respiratory failure with hypercapnia: Secondary | ICD-10-CM | POA: Diagnosis present

## 2021-06-22 DIAGNOSIS — E872 Acidosis: Secondary | ICD-10-CM | POA: Diagnosis not present

## 2021-06-22 DIAGNOSIS — E871 Hypo-osmolality and hyponatremia: Secondary | ICD-10-CM | POA: Diagnosis present

## 2021-06-22 DIAGNOSIS — Z2821 Immunization not carried out because of patient refusal: Secondary | ICD-10-CM

## 2021-06-22 DIAGNOSIS — Z23 Encounter for immunization: Secondary | ICD-10-CM

## 2021-06-22 DIAGNOSIS — L89156 Pressure-induced deep tissue damage of sacral region: Secondary | ICD-10-CM | POA: Diagnosis present

## 2021-06-22 DIAGNOSIS — E43 Unspecified severe protein-calorie malnutrition: Secondary | ICD-10-CM | POA: Diagnosis present

## 2021-06-22 DIAGNOSIS — F05 Delirium due to known physiological condition: Secondary | ICD-10-CM | POA: Diagnosis not present

## 2021-06-22 DIAGNOSIS — C20 Malignant neoplasm of rectum: Secondary | ICD-10-CM | POA: Diagnosis not present

## 2021-06-22 DIAGNOSIS — R64 Cachexia: Secondary | ICD-10-CM | POA: Diagnosis present

## 2021-06-22 DIAGNOSIS — D649 Anemia, unspecified: Secondary | ICD-10-CM | POA: Diagnosis present

## 2021-06-22 DIAGNOSIS — D630 Anemia in neoplastic disease: Secondary | ICD-10-CM | POA: Diagnosis present

## 2021-06-22 DIAGNOSIS — Z8551 Personal history of malignant neoplasm of bladder: Secondary | ICD-10-CM

## 2021-06-22 DIAGNOSIS — Z8673 Personal history of transient ischemic attack (TIA), and cerebral infarction without residual deficits: Secondary | ICD-10-CM

## 2021-06-22 DIAGNOSIS — L8990 Pressure ulcer of unspecified site, unspecified stage: Secondary | ICD-10-CM | POA: Diagnosis present

## 2021-06-22 DIAGNOSIS — I4891 Unspecified atrial fibrillation: Secondary | ICD-10-CM | POA: Diagnosis present

## 2021-06-22 DIAGNOSIS — Z66 Do not resuscitate: Secondary | ICD-10-CM | POA: Diagnosis not present

## 2021-06-22 DIAGNOSIS — N200 Calculus of kidney: Secondary | ICD-10-CM | POA: Diagnosis present

## 2021-06-22 DIAGNOSIS — G928 Other toxic encephalopathy: Secondary | ICD-10-CM | POA: Diagnosis present

## 2021-06-22 DIAGNOSIS — R54 Age-related physical debility: Secondary | ICD-10-CM | POA: Diagnosis present

## 2021-06-22 DIAGNOSIS — K55049 Acute infarction of large intestine, extent unspecified: Secondary | ICD-10-CM | POA: Diagnosis not present

## 2021-06-22 DIAGNOSIS — D72829 Elevated white blood cell count, unspecified: Secondary | ICD-10-CM

## 2021-06-22 DIAGNOSIS — Z20822 Contact with and (suspected) exposure to covid-19: Secondary | ICD-10-CM | POA: Diagnosis present

## 2021-06-22 DIAGNOSIS — Z9981 Dependence on supplemental oxygen: Secondary | ICD-10-CM

## 2021-06-22 DIAGNOSIS — Z932 Ileostomy status: Secondary | ICD-10-CM

## 2021-06-22 DIAGNOSIS — C187 Malignant neoplasm of sigmoid colon: Secondary | ICD-10-CM | POA: Diagnosis not present

## 2021-06-22 DIAGNOSIS — E8770 Fluid overload, unspecified: Secondary | ICD-10-CM | POA: Diagnosis present

## 2021-06-22 DIAGNOSIS — Z79899 Other long term (current) drug therapy: Secondary | ICD-10-CM

## 2021-06-22 DIAGNOSIS — N170 Acute kidney failure with tubular necrosis: Secondary | ICD-10-CM | POA: Diagnosis present

## 2021-06-22 DIAGNOSIS — E876 Hypokalemia: Secondary | ICD-10-CM | POA: Diagnosis not present

## 2021-06-22 DIAGNOSIS — E440 Moderate protein-calorie malnutrition: Secondary | ICD-10-CM | POA: Diagnosis not present

## 2021-06-22 DIAGNOSIS — A419 Sepsis, unspecified organism: Secondary | ICD-10-CM | POA: Diagnosis not present

## 2021-06-22 DIAGNOSIS — I10 Essential (primary) hypertension: Secondary | ICD-10-CM | POA: Diagnosis present

## 2021-06-22 DIAGNOSIS — K631 Perforation of intestine (nontraumatic): Secondary | ICD-10-CM | POA: Diagnosis not present

## 2021-06-22 DIAGNOSIS — C19 Malignant neoplasm of rectosigmoid junction: Secondary | ICD-10-CM | POA: Diagnosis not present

## 2021-06-22 DIAGNOSIS — K5939 Other megacolon: Secondary | ICD-10-CM | POA: Diagnosis present

## 2021-06-22 DIAGNOSIS — J9601 Acute respiratory failure with hypoxia: Secondary | ICD-10-CM

## 2021-06-22 DIAGNOSIS — R571 Hypovolemic shock: Secondary | ICD-10-CM | POA: Diagnosis not present

## 2021-06-22 DIAGNOSIS — Z452 Encounter for adjustment and management of vascular access device: Secondary | ICD-10-CM

## 2021-06-22 DIAGNOSIS — G9340 Encephalopathy, unspecified: Secondary | ICD-10-CM | POA: Diagnosis not present

## 2021-06-22 DIAGNOSIS — J9611 Chronic respiratory failure with hypoxia: Secondary | ICD-10-CM | POA: Diagnosis present

## 2021-06-22 DIAGNOSIS — Z7189 Other specified counseling: Secondary | ICD-10-CM | POA: Diagnosis not present

## 2021-06-22 DIAGNOSIS — R680 Hypothermia, not associated with low environmental temperature: Secondary | ICD-10-CM | POA: Diagnosis not present

## 2021-06-22 DIAGNOSIS — J9621 Acute and chronic respiratory failure with hypoxia: Secondary | ICD-10-CM | POA: Diagnosis not present

## 2021-06-22 DIAGNOSIS — R34 Anuria and oliguria: Secondary | ICD-10-CM | POA: Diagnosis present

## 2021-06-22 DIAGNOSIS — N19 Unspecified kidney failure: Secondary | ICD-10-CM

## 2021-06-22 DIAGNOSIS — R5381 Other malaise: Secondary | ICD-10-CM | POA: Diagnosis present

## 2021-06-22 DIAGNOSIS — E785 Hyperlipidemia, unspecified: Secondary | ICD-10-CM | POA: Diagnosis present

## 2021-06-22 DIAGNOSIS — Z6822 Body mass index (BMI) 22.0-22.9, adult: Secondary | ICD-10-CM

## 2021-06-22 DIAGNOSIS — Z515 Encounter for palliative care: Secondary | ICD-10-CM | POA: Diagnosis not present

## 2021-06-22 DIAGNOSIS — K56699 Other intestinal obstruction unspecified as to partial versus complete obstruction: Secondary | ICD-10-CM | POA: Diagnosis not present

## 2021-06-22 DIAGNOSIS — K6389 Other specified diseases of intestine: Secondary | ICD-10-CM

## 2021-06-22 DIAGNOSIS — R001 Bradycardia, unspecified: Secondary | ICD-10-CM | POA: Diagnosis not present

## 2021-06-22 DIAGNOSIS — K219 Gastro-esophageal reflux disease without esophagitis: Secondary | ICD-10-CM | POA: Diagnosis present

## 2021-06-22 DIAGNOSIS — Z8546 Personal history of malignant neoplasm of prostate: Secondary | ICD-10-CM

## 2021-06-22 DIAGNOSIS — R627 Adult failure to thrive: Secondary | ICD-10-CM | POA: Diagnosis present

## 2021-06-22 DIAGNOSIS — I9589 Other hypotension: Secondary | ICD-10-CM | POA: Diagnosis present

## 2021-06-22 DIAGNOSIS — Z978 Presence of other specified devices: Secondary | ICD-10-CM

## 2021-06-22 DIAGNOSIS — R339 Retention of urine, unspecified: Secondary | ICD-10-CM | POA: Diagnosis not present

## 2021-06-22 DIAGNOSIS — R1084 Generalized abdominal pain: Secondary | ICD-10-CM | POA: Diagnosis not present

## 2021-06-22 DIAGNOSIS — C772 Secondary and unspecified malignant neoplasm of intra-abdominal lymph nodes: Secondary | ICD-10-CM | POA: Diagnosis present

## 2021-06-22 DIAGNOSIS — R0602 Shortness of breath: Secondary | ICD-10-CM

## 2021-06-22 DIAGNOSIS — I973 Postprocedural hypertension: Secondary | ICD-10-CM | POA: Diagnosis not present

## 2021-06-22 DIAGNOSIS — Z28310 Unvaccinated for covid-19: Secondary | ICD-10-CM

## 2021-06-22 DIAGNOSIS — C188 Malignant neoplasm of overlapping sites of colon: Secondary | ICD-10-CM | POA: Diagnosis not present

## 2021-06-22 DIAGNOSIS — J9600 Acute respiratory failure, unspecified whether with hypoxia or hypercapnia: Secondary | ICD-10-CM | POA: Diagnosis not present

## 2021-06-22 DIAGNOSIS — I96 Gangrene, not elsewhere classified: Secondary | ICD-10-CM | POA: Diagnosis present

## 2021-06-22 DIAGNOSIS — R0603 Acute respiratory distress: Secondary | ICD-10-CM | POA: Diagnosis not present

## 2021-06-22 DIAGNOSIS — F17210 Nicotine dependence, cigarettes, uncomplicated: Secondary | ICD-10-CM | POA: Diagnosis present

## 2021-06-22 DIAGNOSIS — R252 Cramp and spasm: Secondary | ICD-10-CM | POA: Diagnosis not present

## 2021-06-22 DIAGNOSIS — D62 Acute posthemorrhagic anemia: Secondary | ICD-10-CM | POA: Diagnosis not present

## 2021-06-22 DIAGNOSIS — K55069 Acute infarction of intestine, part and extent unspecified: Secondary | ICD-10-CM | POA: Diagnosis present

## 2021-06-22 DIAGNOSIS — R1310 Dysphagia, unspecified: Secondary | ICD-10-CM | POA: Diagnosis present

## 2021-06-22 HISTORY — PX: LAPAROTOMY: SHX154

## 2021-06-22 HISTORY — DX: Chronic obstructive pulmonary disease, unspecified: J44.9

## 2021-06-22 LAB — CBC WITH DIFFERENTIAL/PLATELET
Abs Immature Granulocytes: 0.2 10*3/uL — ABNORMAL HIGH (ref 0.00–0.07)
Basophils Absolute: 0 10*3/uL (ref 0.0–0.1)
Basophils Relative: 0 %
Eosinophils Absolute: 0 10*3/uL (ref 0.0–0.5)
Eosinophils Relative: 0 %
HCT: 35.2 % — ABNORMAL LOW (ref 39.0–52.0)
Hemoglobin: 10.6 g/dL — ABNORMAL LOW (ref 13.0–17.0)
Immature Granulocytes: 1 %
Lymphocytes Relative: 3 %
Lymphs Abs: 0.5 10*3/uL — ABNORMAL LOW (ref 0.7–4.0)
MCH: 26.5 pg (ref 26.0–34.0)
MCHC: 30.1 g/dL (ref 30.0–36.0)
MCV: 88 fL (ref 80.0–100.0)
Monocytes Absolute: 1.3 10*3/uL — ABNORMAL HIGH (ref 0.1–1.0)
Monocytes Relative: 8 %
Neutro Abs: 13.3 10*3/uL — ABNORMAL HIGH (ref 1.7–7.7)
Neutrophils Relative %: 88 %
Platelets: 462 10*3/uL — ABNORMAL HIGH (ref 150–400)
RBC: 4 MIL/uL — ABNORMAL LOW (ref 4.22–5.81)
RDW: 17.4 % — ABNORMAL HIGH (ref 11.5–15.5)
WBC: 15.3 10*3/uL — ABNORMAL HIGH (ref 4.0–10.5)
nRBC: 0 % (ref 0.0–0.2)

## 2021-06-22 LAB — COMPREHENSIVE METABOLIC PANEL
ALT: 7 U/L (ref 0–44)
AST: 10 U/L — ABNORMAL LOW (ref 15–41)
Albumin: 3.1 g/dL — ABNORMAL LOW (ref 3.5–5.0)
Alkaline Phosphatase: 88 U/L (ref 38–126)
Anion gap: 11 (ref 5–15)
BUN: 26 mg/dL — ABNORMAL HIGH (ref 8–23)
CO2: 32 mmol/L (ref 22–32)
Calcium: 9.4 mg/dL (ref 8.9–10.3)
Chloride: 90 mmol/L — ABNORMAL LOW (ref 98–111)
Creatinine, Ser: 0.88 mg/dL (ref 0.61–1.24)
GFR, Estimated: >60 mL/min (ref >60)
Glucose, Bld: 136 mg/dL — ABNORMAL HIGH (ref 70–99)
Potassium: 3.8 mmol/L (ref 3.5–5.1)
Sodium: 133 mmol/L — ABNORMAL LOW (ref 135–145)
Total Bilirubin: 0.7 mg/dL (ref 0.3–1.2)
Total Protein: 6.7 g/dL (ref 6.5–8.1)

## 2021-06-22 LAB — ABO/RH: ABO/RH(D): A POS

## 2021-06-22 LAB — BLOOD GAS, ARTERIAL
Acid-base deficit: 0.4 mmol/L (ref 0.0–2.0)
Bicarbonate: 24.5 mmol/L (ref 20.0–28.0)
Drawn by: 270211
FIO2: 100
MECHVT: 620 mL
O2 Saturation: 99.2 %
Patient temperature: 98.6
RATE: 14 resp/min
pCO2 arterial: 44.7 mmHg (ref 32.0–48.0)
pH, Arterial: 7.358 (ref 7.350–7.450)
pO2, Arterial: 148 mmHg — ABNORMAL HIGH (ref 83.0–108.0)

## 2021-06-22 LAB — BASIC METABOLIC PANEL
Anion gap: 9 (ref 5–15)
BUN: 26 mg/dL — ABNORMAL HIGH (ref 8–23)
CO2: 25 mmol/L (ref 22–32)
Calcium: 7.7 mg/dL — ABNORMAL LOW (ref 8.9–10.3)
Chloride: 100 mmol/L (ref 98–111)
Creatinine, Ser: 0.95 mg/dL (ref 0.61–1.24)
GFR, Estimated: >60 mL/min (ref >60)
Glucose, Bld: 198 mg/dL — ABNORMAL HIGH (ref 70–99)
Potassium: 4 mmol/L (ref 3.5–5.1)
Sodium: 134 mmol/L — ABNORMAL LOW (ref 135–145)

## 2021-06-22 LAB — PREPARE RBC (CROSSMATCH)

## 2021-06-22 LAB — HEMOGLOBIN AND HEMATOCRIT, BLOOD
HCT: 25 % — ABNORMAL LOW (ref 39.0–52.0)
HCT: 26.5 % — ABNORMAL LOW (ref 39.0–52.0)
Hemoglobin: 7.6 g/dL — ABNORMAL LOW (ref 13.0–17.0)
Hemoglobin: 8.2 g/dL — ABNORMAL LOW (ref 13.0–17.0)

## 2021-06-22 LAB — GLUCOSE, CAPILLARY
Glucose-Capillary: 164 mg/dL — ABNORMAL HIGH (ref 70–99)
Glucose-Capillary: 171 mg/dL — ABNORMAL HIGH (ref 70–99)
Glucose-Capillary: 180 mg/dL — ABNORMAL HIGH (ref 70–99)

## 2021-06-22 LAB — BRAIN NATRIURETIC PEPTIDE: B Natriuretic Peptide: 168.2 pg/mL — ABNORMAL HIGH (ref 0.0–100.0)

## 2021-06-22 LAB — RESP PANEL BY RT-PCR (FLU A&B, COVID) ARPGX2
Influenza A by PCR: NEGATIVE
Influenza B by PCR: NEGATIVE
SARS Coronavirus 2 by RT PCR: NEGATIVE

## 2021-06-22 LAB — TROPONIN I (HIGH SENSITIVITY)
Troponin I (High Sensitivity): 10 ng/L (ref <18)
Troponin I (High Sensitivity): 12 ng/L (ref <18)

## 2021-06-22 LAB — MRSA NEXT GEN BY PCR, NASAL: MRSA by PCR Next Gen: DETECTED — AB

## 2021-06-22 LAB — LACTIC ACID, PLASMA: Lactic Acid, Venous: 1.1 mmol/L (ref 0.5–1.9)

## 2021-06-22 SURGERY — LAPAROTOMY, EXPLORATORY
Anesthesia: General | Site: Abdomen

## 2021-06-22 MED ORDER — ONDANSETRON HCL 4 MG/2ML IJ SOLN
INTRAMUSCULAR | Status: DC | PRN
  Administered 2021-06-22: 4 mg via INTRAVENOUS

## 2021-06-22 MED ORDER — SODIUM CHLORIDE 0.9 % IV SOLN: Freq: Three times a day (TID) | INTRAVENOUS | Status: AC | PRN

## 2021-06-22 MED ORDER — ROCURONIUM BROMIDE 100 MG/10ML IV SOLN
INTRAVENOUS | Status: DC | PRN
Start: 2021-06-22 — End: 2021-06-22
  Administered 2021-06-22 (×2): 50 mg via INTRAVENOUS

## 2021-06-22 MED ORDER — DOCUSATE SODIUM 50 MG/5ML PO LIQD: 100.0000 mg | Freq: Two times a day (BID) | ORAL | Status: DC

## 2021-06-22 MED ORDER — CHLORHEXIDINE GLUCONATE CLOTH 2 % EX PADS
6.0000 | MEDICATED_PAD | Freq: Every day | CUTANEOUS | Status: DC
  Administered 2021-06-22: 6 via TOPICAL

## 2021-06-22 MED ORDER — ORAL CARE MOUTH RINSE
15.0000 mL | OROMUCOSAL | Status: DC
  Administered 2021-06-22 – 2021-06-25 (×32): 15 mL via OROMUCOSAL

## 2021-06-22 MED ORDER — VASOPRESSIN 20 UNIT/ML IV SOLN
INTRAVENOUS | Status: DC | PRN
  Administered 2021-06-22: 1 [IU] via INTRAVENOUS

## 2021-06-22 MED ORDER — SUCCINYLCHOLINE CHLORIDE 200 MG/10ML IV SOSY
PREFILLED_SYRINGE | INTRAVENOUS | Status: DC | PRN
Start: 2021-06-22 — End: 2021-06-22
  Administered 2021-06-22: 100 mg via INTRAVENOUS

## 2021-06-22 MED ORDER — LACTATED RINGERS IV SOLN: INTRAVENOUS | Status: AC

## 2021-06-22 MED ORDER — DIPHENHYDRAMINE HCL 50 MG/ML IJ SOLN: 12.5000 mg | Freq: Four times a day (QID) | INTRAMUSCULAR | Status: DC | PRN

## 2021-06-22 MED ORDER — ALBUMIN HUMAN 5 % IV SOLN: INTRAVENOUS | Status: DC | PRN

## 2021-06-22 MED ORDER — CHLORHEXIDINE GLUCONATE 0.12% ORAL RINSE (MEDLINE KIT)
15.0000 mL | Freq: Two times a day (BID) | OROMUCOSAL | Status: DC
  Administered 2021-06-22 – 2021-07-02 (×20): 15 mL via OROMUCOSAL

## 2021-06-22 MED ORDER — METHOCARBAMOL 1000 MG/10ML IJ SOLN
1000.0000 mg | Freq: Four times a day (QID) | INTRAVENOUS | Status: DC | PRN
  Filled 2021-06-22: qty 10

## 2021-06-22 MED ORDER — BUDESONIDE 0.5 MG/2ML IN SUSP
0.5000 mg | Freq: Two times a day (BID) | RESPIRATORY_TRACT | Status: DC
  Administered 2021-06-22 – 2021-07-10 (×33): 0.5 mg via RESPIRATORY_TRACT
  Filled 2021-06-22 (×37): qty 2

## 2021-06-22 MED ORDER — DEXAMETHASONE SODIUM PHOSPHATE 10 MG/ML IJ SOLN
INTRAMUSCULAR | Status: DC | PRN
  Administered 2021-06-22: 5 mg via INTRAVENOUS

## 2021-06-22 MED ORDER — SODIUM CHLORIDE 0.9 % IV SOLN: INTRAVENOUS | Status: DC

## 2021-06-22 MED ORDER — PHENYLEPHRINE HCL-NACL 20-0.9 MG/250ML-% IV SOLN
0.0000 ug/min | INTRAVENOUS | Status: DC
  Filled 2021-06-22: qty 250

## 2021-06-22 MED ORDER — FENTANYL BOLUS VIA INFUSION
25.0000 ug | INTRAVENOUS | Status: DC | PRN
  Administered 2021-06-23: 50 ug via INTRAVENOUS
  Administered 2021-06-23: 75 ug via INTRAVENOUS
  Administered 2021-06-23: 50 ug via INTRAVENOUS
  Filled 2021-06-22: qty 100

## 2021-06-22 MED ORDER — PHENYLEPHRINE HCL (PRESSORS) 10 MG/ML IV SOLN
INTRAVENOUS | Status: DC | PRN
Start: 2021-06-22 — End: 2021-06-22
  Administered 2021-06-22 (×2): 120 ug via INTRAVENOUS
  Administered 2021-06-22: 80 ug via INTRAVENOUS
  Administered 2021-06-22: 200 ug via INTRAVENOUS
  Administered 2021-06-22 (×2): 120 ug via INTRAVENOUS
  Administered 2021-06-22 (×3): 80 ug via INTRAVENOUS

## 2021-06-22 MED ORDER — MAGIC MOUTHWASH
15.0000 mL | Freq: Four times a day (QID) | ORAL | Status: DC | PRN
  Filled 2021-06-22: qty 15

## 2021-06-22 MED ORDER — IOHEXOL 350 MG/ML SOLN
100.0000 mL | Freq: Once | INTRAVENOUS | Status: AC | PRN
  Administered 2021-06-22: 80 mL via INTRAVENOUS

## 2021-06-22 MED ORDER — PROPOFOL 10 MG/ML IV BOLUS
INTRAVENOUS | Status: DC | PRN
  Administered 2021-06-22: 100 mg via INTRAVENOUS

## 2021-06-22 MED ORDER — CHLORHEXIDINE GLUCONATE CLOTH 2 % EX PADS
6.0000 | MEDICATED_PAD | Freq: Every day | CUTANEOUS | Status: DC
  Administered 2021-06-23 – 2021-06-26 (×4): 6 via TOPICAL

## 2021-06-22 MED ORDER — PHENYLEPHRINE 40 MCG/ML (10ML) SYRINGE FOR IV PUSH (FOR BLOOD PRESSURE SUPPORT)
PREFILLED_SYRINGE | INTRAVENOUS | Status: AC
  Filled 2021-06-22: qty 30

## 2021-06-22 MED ORDER — VASOPRESSIN 20 UNIT/ML IV SOLN
INTRAVENOUS | Status: AC
  Filled 2021-06-22: qty 1

## 2021-06-22 MED ORDER — SODIUM CHLORIDE 0.9 % IV SOLN
250.0000 mL | INTRAVENOUS | Status: DC
  Administered 2021-06-22 – 2021-06-28 (×2): 250 mL via INTRAVENOUS

## 2021-06-22 MED ORDER — INSULIN ASPART 100 UNIT/ML IJ SOLN
0.0000 [IU] | INTRAMUSCULAR | Status: DC
  Administered 2021-06-22 – 2021-06-23 (×7): 2 [IU] via SUBCUTANEOUS
  Administered 2021-06-24 – 2021-06-29 (×10): 1 [IU] via SUBCUTANEOUS

## 2021-06-22 MED ORDER — PHENOL 1.4 % MT LIQD: 2.0000 | OROMUCOSAL | Status: DC | PRN

## 2021-06-22 MED ORDER — ARFORMOTEROL TARTRATE 15 MCG/2ML IN NEBU
15.0000 ug | INHALATION_SOLUTION | Freq: Two times a day (BID) | RESPIRATORY_TRACT | Status: DC
  Administered 2021-06-22 – 2021-06-26 (×8): 15 ug via RESPIRATORY_TRACT
  Filled 2021-06-22 (×8): qty 2

## 2021-06-22 MED ORDER — VASOPRESSIN 20 UNITS/100 ML INFUSION FOR SHOCK
0.0000 [IU]/min | INTRAVENOUS | Status: DC
  Administered 2021-06-22 – 2021-06-25 (×7): 0.03 [IU]/min via INTRAVENOUS
  Filled 2021-06-22 (×7): qty 100

## 2021-06-22 MED ORDER — SODIUM CHLORIDE 0.9 % IV BOLUS
1000.0000 mL | Freq: Once | INTRAVENOUS | Status: AC
  Administered 2021-06-22: 1000 mL via INTRAVENOUS

## 2021-06-22 MED ORDER — PHENYLEPHRINE HCL-NACL 20-0.9 MG/250ML-% IV SOLN
INTRAVENOUS | Status: DC | PRN
  Administered 2021-06-22: 30 ug/min via INTRAVENOUS

## 2021-06-22 MED ORDER — PIPERACILLIN-TAZOBACTAM 3.375 G IVPB 30 MIN
3.3750 g | Freq: Once | INTRAVENOUS | Status: AC
  Administered 2021-06-22: 3.375 g via INTRAVENOUS
  Filled 2021-06-22: qty 50

## 2021-06-22 MED ORDER — LACTATED RINGERS IV BOLUS: 1000.0000 mL | Freq: Three times a day (TID) | INTRAVENOUS | Status: AC | PRN

## 2021-06-22 MED ORDER — FENTANYL CITRATE (PF) 100 MCG/2ML IJ SOLN
50.0000 ug | Freq: Once | INTRAMUSCULAR | Status: AC
  Administered 2021-06-22: 50 ug via INTRAVENOUS
  Filled 2021-06-22: qty 2

## 2021-06-22 MED ORDER — FENTANYL CITRATE (PF) 100 MCG/2ML IJ SOLN
INTRAMUSCULAR | Status: DC | PRN
  Administered 2021-06-22: 100 ug via INTRAVENOUS

## 2021-06-22 MED ORDER — LIDOCAINE HCL (CARDIAC) PF 100 MG/5ML IV SOSY
PREFILLED_SYRINGE | INTRAVENOUS | Status: DC | PRN
  Administered 2021-06-22: 60 mg via INTRATRACHEAL

## 2021-06-22 MED ORDER — PROCHLORPERAZINE EDISYLATE 10 MG/2ML IJ SOLN: 5.0000 mg | INTRAMUSCULAR | Status: DC | PRN

## 2021-06-22 MED ORDER — PHENYLEPHRINE HCL (PRESSORS) 10 MG/ML IV SOLN
INTRAVENOUS | Status: AC
  Filled 2021-06-22: qty 2

## 2021-06-22 MED ORDER — SODIUM CHLORIDE 0.9 % IV SOLN
8.0000 mg | Freq: Four times a day (QID) | INTRAVENOUS | Status: DC | PRN
  Filled 2021-06-22: qty 4

## 2021-06-22 MED ORDER — POLYETHYLENE GLYCOL 3350 17 G PO PACK: 17.0000 g | PACK | Freq: Every day | ORAL | Status: DC

## 2021-06-22 MED ORDER — SODIUM CHLORIDE 0.9 % IV SOLN
INTRAVENOUS | Status: AC
  Filled 2021-06-22: qty 2

## 2021-06-22 MED ORDER — FENTANYL 2500MCG IN NS 250ML (10MCG/ML) PREMIX INFUSION
25.0000 ug/h | INTRAVENOUS | Status: DC
  Administered 2021-06-22: 25 ug/h via INTRAVENOUS
  Administered 2021-06-24 – 2021-06-25 (×2): 75 ug/h via INTRAVENOUS
  Filled 2021-06-22 (×3): qty 250

## 2021-06-22 MED ORDER — LACTATED RINGERS IV SOLN: INTRAVENOUS | Status: DC | PRN

## 2021-06-22 MED ORDER — MIDAZOLAM HCL 2 MG/2ML IJ SOLN
INTRAMUSCULAR | Status: AC
  Filled 2021-06-22: qty 2

## 2021-06-22 MED ORDER — CHLORHEXIDINE GLUCONATE 0.12 % MT SOLN
OROMUCOSAL | Status: AC
  Administered 2021-06-22: 15 mL via OROMUCOSAL
  Filled 2021-06-22: qty 15

## 2021-06-22 MED ORDER — MENTHOL 3 MG MT LOZG: 1.0000 | LOZENGE | OROMUCOSAL | Status: DC | PRN

## 2021-06-22 MED ORDER — SIMETHICONE 80 MG PO CHEW
40.0000 mg | CHEWABLE_TABLET | Freq: Four times a day (QID) | ORAL | Status: DC | PRN
Start: 2021-06-22 — End: 2021-06-27

## 2021-06-22 MED ORDER — DEXMEDETOMIDINE HCL IN NACL 400 MCG/100ML IV SOLN
0.0000 ug/kg/h | INTRAVENOUS | Status: AC
  Administered 2021-06-22: 1.1 ug/kg/h via INTRAVENOUS
  Administered 2021-06-24 – 2021-06-25 (×2): 0.4 ug/kg/h via INTRAVENOUS
  Filled 2021-06-22 (×4): qty 100

## 2021-06-22 MED ORDER — NOREPINEPHRINE 4 MG/250ML-% IV SOLN
0.0000 ug/min | INTRAVENOUS | Status: DC
  Administered 2021-06-22: 10 ug/min via INTRAVENOUS
  Administered 2021-06-22: 16 ug/min via INTRAVENOUS
  Administered 2021-06-22: 7 ug/min via INTRAVENOUS
  Administered 2021-06-22: 2 ug/min via INTRAVENOUS
  Administered 2021-06-23: 9 ug/min via INTRAVENOUS
  Administered 2021-06-24: 4 ug/min via INTRAVENOUS
  Filled 2021-06-22 (×5): qty 250

## 2021-06-22 MED ORDER — CHLORHEXIDINE GLUCONATE CLOTH 2 % EX PADS
6.0000 | MEDICATED_PAD | Freq: Once | CUTANEOUS | Status: AC
  Administered 2021-06-22: 6 via TOPICAL

## 2021-06-22 MED ORDER — SODIUM CHLORIDE 0.9 % IV SOLN
2.0000 g | INTRAVENOUS | Status: AC
  Administered 2021-06-22: 2 g via INTRAVENOUS

## 2021-06-22 MED ORDER — LACTATED RINGERS IV SOLN: INTRAVENOUS | Status: DC

## 2021-06-22 MED ORDER — DEXMEDETOMIDINE HCL IN NACL 200 MCG/50ML IV SOLN
0.0000 ug/kg/h | INTRAVENOUS | Status: DC
  Administered 2021-06-22: 1 ug/kg/h via INTRAVENOUS
  Administered 2021-06-22: 0.4 ug/kg/h via INTRAVENOUS
  Administered 2021-06-22: 1.2 ug/kg/h via INTRAVENOUS
  Filled 2021-06-22 (×4): qty 50

## 2021-06-22 MED ORDER — PHENYLEPHRINE HCL-NACL 20-0.9 MG/250ML-% IV SOLN
INTRAVENOUS | Status: AC
  Administered 2021-06-22: 160 ug/min via INTRAVENOUS
  Filled 2021-06-22: qty 250

## 2021-06-22 MED ORDER — PIPERACILLIN-TAZOBACTAM 3.375 G IVPB
3.3750 g | Freq: Three times a day (TID) | INTRAVENOUS | Status: AC
  Administered 2021-06-22 – 2021-06-27 (×15): 3.375 g via INTRAVENOUS
  Filled 2021-06-22 (×11): qty 50

## 2021-06-22 MED ORDER — ONDANSETRON HCL 4 MG/2ML IJ SOLN: 4.0000 mg | Freq: Four times a day (QID) | INTRAMUSCULAR | Status: DC | PRN

## 2021-06-22 MED ORDER — FENTANYL CITRATE (PF) 250 MCG/5ML IJ SOLN
INTRAMUSCULAR | Status: AC
  Filled 2021-06-22: qty 5

## 2021-06-22 MED ORDER — MUPIROCIN 2 % EX OINT
1.0000 "application " | TOPICAL_OINTMENT | Freq: Two times a day (BID) | CUTANEOUS | Status: AC
  Administered 2021-06-22 – 2021-06-26 (×10): 1 via NASAL
  Filled 2021-06-22 (×4): qty 22

## 2021-06-22 MED ORDER — LIP MEDEX EX OINT
1.0000 "application " | TOPICAL_OINTMENT | Freq: Two times a day (BID) | CUTANEOUS | Status: DC
  Administered 2021-06-22 – 2021-07-10 (×29): 1 via TOPICAL
  Filled 2021-06-22 (×7): qty 7

## 2021-06-22 MED ORDER — ALUM & MAG HYDROXIDE-SIMETH 200-200-20 MG/5ML PO SUSP: 30.0000 mL | Freq: Four times a day (QID) | ORAL | Status: DC | PRN

## 2021-06-22 MED ORDER — PHENYLEPHRINE HCL-NACL 20-0.9 MG/250ML-% IV SOLN
25.0000 ug/min | INTRAVENOUS | Status: DC
  Administered 2021-06-22: 130 ug/min via INTRAVENOUS
  Filled 2021-06-22: qty 250

## 2021-06-22 MED ORDER — PROPOFOL 10 MG/ML IV BOLUS
INTRAVENOUS | Status: AC
  Filled 2021-06-22: qty 20

## 2021-06-22 MED ORDER — FENTANYL CITRATE (PF) 100 MCG/2ML IJ SOLN
25.0000 ug | INTRAMUSCULAR | Status: DC | PRN
  Administered 2021-06-22 (×2): 100 ug via INTRAVENOUS
  Filled 2021-06-22 (×2): qty 2

## 2021-06-22 MED ORDER — SODIUM CHLORIDE 0.9% IV SOLUTION: Freq: Once | INTRAVENOUS | Status: AC

## 2021-06-22 MED ORDER — NOREPINEPHRINE 4 MG/250ML-% IV SOLN
INTRAVENOUS | Status: AC
  Filled 2021-06-22: qty 250

## 2021-06-22 SURGICAL SUPPLY — 61 items
APL PRP STRL LF DISP 70% ISPRP (MISCELLANEOUS) ×1
BAG COUNTER SPONGE SURGICOUNT (BAG) ×1 IMPLANT
BAG SPNG CNTER NS LX DISP (BAG) ×1
BLADE EXTENDED COATED 6.5IN (ELECTRODE) ×1 IMPLANT
BLADE HEX COATED 2.75 (ELECTRODE) ×1 IMPLANT
CANISTER WOUND CARE 500ML ATS (WOUND CARE) ×1 IMPLANT
CHLORAPREP W/TINT 26 (MISCELLANEOUS) ×2 IMPLANT
CLAMP POUCH DRAINAGE QUIET (OSTOMY) ×1 IMPLANT
COUNTER NEEDLE 20 DBL MAG RED (NEEDLE) ×1 IMPLANT
COVER MAYO STAND STRL (DRAPES) ×1 IMPLANT
COVER SURGICAL LIGHT HANDLE (MISCELLANEOUS) ×2 IMPLANT
DRAIN CHANNEL 19F RND (DRAIN) IMPLANT
DRAPE LAPAROSCOPIC ABDOMINAL (DRAPES) ×2 IMPLANT
DRAPE SHEET LG 3/4 BI-LAMINATE (DRAPES) ×1 IMPLANT
DRAPE UTILITY XL STRL (DRAPES) ×2 IMPLANT
DRAPE WARM FLUID 44X44 (DRAPES) ×2 IMPLANT
DRSG OPSITE POSTOP 4X10 (GAUZE/BANDAGES/DRESSINGS) IMPLANT
DRSG OPSITE POSTOP 4X6 (GAUZE/BANDAGES/DRESSINGS) IMPLANT
DRSG OPSITE POSTOP 4X8 (GAUZE/BANDAGES/DRESSINGS) IMPLANT
DRSG VAC ATS MED SENSATRAC (GAUZE/BANDAGES/DRESSINGS) ×1 IMPLANT
ELECT REM PT RETURN 15FT ADLT (MISCELLANEOUS) ×2 IMPLANT
GAUZE SPONGE 4X4 12PLY STRL (GAUZE/BANDAGES/DRESSINGS) ×1 IMPLANT
GLOVE SURG NEOPR MICRO LF SZ8 (GLOVE) ×4 IMPLANT
GLOVE SURG UNDER LTX SZ8 (GLOVE) ×4 IMPLANT
GOWN STRL REUS W/TWL XL LVL3 (GOWN DISPOSABLE) ×5 IMPLANT
HANDLE SUCTION POOLE (INSTRUMENTS) ×1 IMPLANT
HEMOSTAT SNOW SURGICEL 2X4 (HEMOSTASIS) ×2 IMPLANT
KIT BASIN OR (CUSTOM PROCEDURE TRAY) ×2 IMPLANT
KIT TURNOVER KIT A (KITS) ×2 IMPLANT
LEGGING LITHOTOMY PAIR STRL (DRAPES) IMPLANT
LIGASURE IMPACT 36 18CM CVD LR (INSTRUMENTS) ×1 IMPLANT
PACK GENERAL/GYN (CUSTOM PROCEDURE TRAY) ×2 IMPLANT
PAD POSITIONING PINK XL (MISCELLANEOUS) ×2 IMPLANT
PENCIL SMOKE EVACUATOR (MISCELLANEOUS) IMPLANT
POUCH OSTOMY 1 PC DRNBL  2 1/2 (OSTOMY) IMPLANT
POUCH OSTOMY 2 1/2 (OSTOMY) ×2
RELOAD BL CONTOUR (ENDOMECHANICALS) ×2 IMPLANT
RELOAD STAPLE 40 BLU REG (ENDOMECHANICALS) IMPLANT
SPONGE T-LAP 18X18 ~~LOC~~+RFID (SPONGE) ×5 IMPLANT
STAPLER CVD CUT BL 40 RELOAD (ENDOMECHANICALS) ×2 IMPLANT
STAPLER CVD CUT BLU 40 RELOAD (ENDOMECHANICALS) IMPLANT
STAPLER VISISTAT 35W (STAPLE) ×2 IMPLANT
SUCTION POOLE HANDLE (INSTRUMENTS) ×2
SUT MNCRL AB 4-0 PS2 18 (SUTURE) IMPLANT
SUT PDS AB 1 CTX 36 (SUTURE) IMPLANT
SUT PDS AB 1 TP1 96 (SUTURE) ×2 IMPLANT
SUT PROLENE 2 0 SH DA (SUTURE) ×1 IMPLANT
SUT SILK 0 (SUTURE)
SUT SILK 0 30XBRD TIE 6 (SUTURE) IMPLANT
SUT SILK 2 0 (SUTURE) ×2
SUT SILK 2 0 SH CR/8 (SUTURE) ×2 IMPLANT
SUT SILK 2-0 18XBRD TIE 12 (SUTURE) ×1 IMPLANT
SUT SILK 3 0 (SUTURE) ×2
SUT SILK 3 0 SH CR/8 (SUTURE) ×2 IMPLANT
SUT SILK 3-0 18XBRD TIE 12 (SUTURE) ×1 IMPLANT
SUT VIC AB 2-0 SH 18 (SUTURE) ×2 IMPLANT
SUT VICRYL 0 UR6 27IN ABS (SUTURE) IMPLANT
TAPE UMBILICAL 1/8 X36 TWILL (MISCELLANEOUS) ×1 IMPLANT
TOWEL OR 17X26 10 PK STRL BLUE (TOWEL DISPOSABLE) ×3 IMPLANT
TOWEL OR NON WOVEN STRL DISP B (DISPOSABLE) ×2 IMPLANT
TRAY FOLEY MTR SLVR 16FR STAT (SET/KITS/TRAYS/PACK) ×2 IMPLANT

## 2021-06-22 NOTE — Op Note (Signed)
06/11/2021  10:59 AM  PATIENT:  Daniel King  75 y.o. 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  See anesthesia record  Delay start of Pharmacological VTE agent (>24hrs) due to surgical blood loss or risk of bleeding:  yes (came in already fully anticoagulated on Plavix)  DRAINS: none   SPECIMEN: Abdominal colon - Rectosigmoid stricture probable cancer.  DISPOSITION OF SPECIMEN:  PATHOLOGY  COUNTS:  YES  PLAN OF CARE: Admit to inpatient   PATIENT DISPOSITION:  ICU - intubated and critically ill.  INDICATION: Patient with history atrial fibrillation and stroke chronically accolade on Plavix.  Ox dependent COPD worsening abdominal pain and failure to thrive.  Found to have massively dilated colon with pneumatosis and perforation on the right and rectosigmoid stricture suspicious for malignancy.  Becoming tachycardic in early shock.  I recommended emergent abdominal exploration.  Probable colectomy and ostomy.  The anatomy & physiology of the digestive tract was discussed.  The pathophysiology of perforation was discussed.  Differential diagnosis such as perforated ulcer or colon, etc was discussed.   Natural history risks without surgery such as death was discussed.  I recommended abdominal exploration to diagnose & treat the source of the problem.  Laparoscopic & open techniques were discussed.   Risks such as bleeding, infection,  abscess, leak, reoperation, bowel resection, possible ostomy, injury to other organs, need for repair of tissues / organs, hernia, heart attack, death, and other risks were discussed.   The risks of no intervention will lead to serious problems including death.   I expressed a good likelihood that surgery will address the problem.    Goals of post-operative recovery were discussed as well.  We will work to minimize complications although risks in an emergent setting are high.   Questions were answered.  The patient and his wife expressed understanding & wishes to proceed with surgery.      OR FINDINGS: Massively dilated colon.  Cecum and ascending colon with ischemia and patches of phlegmon and gangrene.  Microperforation.  No major feculent contamination.  Rocky hard rectosigmoid mass without any major inflammation.  Suspicious for malignancy.  Abdominal colectomy with end ileostomy done.  Prolene suture at rectal stump.  Oozy tissues since fully anticoagulated Plavix but hemostasis insured.  CASE DATA:  Type of patient?: LDOW CASE (Surgical Hospitalist [facility] Inpatient)  Status of Case? EMERGENT Add On  Infection Present At Time Of Surgery (PATOS)?  PHLEGMON  DESCRIPTION: Informed consent was confirmed.   The patient received IV antibiotics and underwent general anesthesia without any difficulty. The patient was positioned appropriately. Foley catheter had been sterilely placed. SCDs were active during the entire case.  The abdomen was prepped and draped in a sterile fashion.  Surgical timeout confirmed our plan.  Entry was gained through a midline incision.    Upon entering the abdomen (organ space), I encountered a phlegmon involving the cecum and ascending colon with patches of gangrene in foul odor suspicion for perforation.  The colon was massively dilated.  Eviscerated the small bowel.  We then mobilized the colon in a lateral medial fashion starting in the ileocecal region.  Mobilized off the  retroperitoneum and came around the hepatic flexure.  Right colon eviscerated confirming significant ischemia with patches of small perforation.  And then freed the greater omentum off the transverse colon towards the splenic flexure.  Follow descending colon down to the sigmoid.  Could feel a hard rocky mass at the rectosigmoid junction suspicious for malignancy.  No major inflammation.  Mobilized the rectosigmoid off the left pelvis in the lateral medial fashion.  Freed the mesentery off the retroperitoneum.  I could see and spare the left ureter gonadal's and kept them in the retroperitoneal position.  I found a window at the proximal rectum at the level of the sacral promontory that was viable and healthy.  I transected across it using a contour stapler.  We then mobilized the left colon off the retroperitoneum and left kidney.  We then focused around the splenic flexure of the colon and eventually mobilized it into a superior to inferior fashion taking care to spare the spleen which is rather small.  With the entire colon mobilized to the midline we focused on transection of the mesentery.  I started at the IMA pedicle.  Transected using a LigaSure vessel sealer.  Came around proximally to the ileocecal region.  Took care to make sure ureters and duodenum left in the retroperitoneal position and spared.  Created a window in the ileocecal mesentery.  Transected the terminal ileum with another load of the contour stapler.  Completed transection of the mesentery and sent the specimen off.  We did inspection.  Controlled some mild oozing on the retroperitoneum right and left lateral at the right of told using touch cautery did suture at the ligament of Treitz where the transverse colon had been stuck with a silk suture to good result.  Did copious irrigation.  The splenic hilum retroperitoneum had some mild oozing.  We placed snow up there and packed.  I felt a nodule in the left posterior pelvis ellipsoid.   Looks like an auto infarcted epiploic appendage but was able to remove that and send that separately.  We inspected the omentum and removed a few ischemic segments but was able to spare a decent portion of it.  We irrigated the abdomen copiously and did meticulous inspection and proved hemostasis on the mesentery and retroperitoneum.  We ran the small bowel and allowed it to return into the abdominal cavity with a nice broad mesentery.  Terminal ileum mesentery resting in the right paracolic gutter  Chose a region in the right upper quadrant in the paramedian region.  I made a 2 cm subcutaneous circular defect and came through the subcutaneous tissues.  I came through the right anterior to's fascia transversely and rectus muscle and posterior rectus fascia vertically.  Dilated to 1 finger.  Brought the terminal ileum up through there.  Confirmed mesentery laid well without any twisting or torsion.  Reinspected the abdomen last time to converse good hemostasis.  We changed gloves and redraped.  I closed the fascia using #1 PDS in a running fashion.  We left the subcutaneous tissues open given the perforation and placed a wound VAC on the fascia.  I then matured the ileostomy in a Brooke fashion using 2-0 Vicryl interrupted sutures to have a 3 cm high ileostomy.  Mucosa edematous and viable.  Had had good bleeding at the distal end and and assured  hemostasis with sutures.  Ileostomy appliance applied.  Patient did have an episode of hypotension in the middle of the case in the absence of any major blood loss.  Improved with colloid and volume.  On pressors but better stabilized.  Myself and anesthesia felt it would be wise to keep the patient intubated with critical care consultation.  Patient transferred to the intensive care unit.  I called and discussed with the critical care team who will consult and follow with [place].  I called and updated the patient's wife.  Splane operative findings.  Noted he is sick in  the intensive care unit.  He still has a high risk of morbidity & mortality; but, hopefully with the gangrenous perforated colon removed and probable malignancy improved, he has a chance now.  Questions answered.  She expressed understanding and appreciation.       [name], M.D., F.A.C.S. Gastrointestinal and Minimally Invasive Surgery [facility] [address], [place], [place] [address] [phone] Main / Paging

## 2021-06-22 NOTE — Consult Note (Signed)
NAME:  Daniel King, MRN:  [id number], DOB:  05-Apr-1946, LOS: 0 ADMISSION DATE:  06/19/2021, CONSULTATION DATE:  8/19 REFERRING MD:  Dr. Johney Maine, CHIEF COMPLAINT:  Post-operative respiratory failure    History of Present Illness:  75 y/o M who presented to [facility] on [facility] with reports of 24 hours of abdominal pain that worsened the am of presentation.   He reported his last BM on 8/18, prior to that was one week ago.  On presentation he was note to have abdominal distention, tenderness.  CT of the abdomen was assessed with concerns for primary colon cancer in the mid sigmoid colon complicated by colonic obstruction with pneumatosis involving the cecum and ascending colon, consistent with ischemic colitis. Evidence of bowel perforation with small volume pneumoperitoneum.  He was evaluated by [facility]CS and recommended for open colectomy / ileostomy.  He was on plavix prior to admit.  He was started on IV zosyn.  Intraoperative findings notable for massively dilated colon, cecum and ascending colon with ischemia and patches of phlegmon & gangrene with approximately 3 feet of colon removed, small volume pneumoperitoneum, rocky hard rectosigmoid mass worrisome for malignancy.  EBL estimated at 120m.  He had intraoperative hypotension requiring vasopressors.  The patient was returned to the ICU post surgery on mechanical ventilation. Post op Hgb 7.6.  The patient was given 1 unit PRBC.    PCCM consulted for assistance with ICU care.    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 Returned to ICU on vent.   Interim History / Subjective:  CRNA reports EBL ~ 150, approx 3 ft colon removed,  hypotension intraoperative On neosynephrine   Objective   Blood pressure (!) 135/53, pulse 73, temperature 99.1 F (37.3 C), resp. rate 14, height 6' (1.829 m), weight 68 kg, SpO2 100 %.    Vent Mode: PRVC FiO2 (%):  [40 %-60 %] 40 % Set Rate:  [14 bmp] 14 bmp Vt Set:  [620 mL] 620 mL PEEP:  [5 cmH20] 5 cmH20 Plateau Pressure:  [13 cmH20-17 cmH20] 17 cmH20   Intake/Output Summary (Last 24 hours) at 06/16/2021 1735 Last data filed at 06/29/2021 1623 Gross per 24 hour  Intake 4469.65 ml  Output 300 ml  Net 4169.65 ml   Filed Weights   06/29/2021 0356  Weight: 68 kg    Examination: General: ill appearing adult male lying in bed in NAD on vent HENT: ETT, pupils 393mreactive, anicteric Lungs: non-labored on vent, lungs bilaterally distant but clear, barrel chest noted  Cardiovascular: s1s2 rrr, no m/r/g Abdomen: midline VAC in place, right colostomy with pink stoma, thin bloody drainage in ostomy bag Extremities: warm/dry, no edema Neuro: sedate    Resolved Hospital Problem list     Assessment & Plan:   Acute Bowel Perforation s/p Open Colectomy with Ileostomy Concern for malignancy based on intraoperative findings.  -post operative and wound care per CCS  -follow up pathology  -will need WOC assistance for teaching once able to perform self care of ostomy   Acute Hypoxic Respiratory Failure  COPD without AE -PRVC 8cc/kg  -wean PEEP / fiO2 for sats >90% -follow up ABG -CXR post intubation with ETT in  good position  -SBT / WUA in am  -brovana + pulmicort BID   Sedation Needs while on Mechanical Ventilation  -RASS Goal -1 to -2 -PAD protocol with fentanyl, precedex   HTN, HLD -hold home metoprolol, norvasc, lipitor, lisinopril, lopressor   Hx CVA -hold home plavix, ASA   At Risk Malnutrition  -defer timing of enteral feeding to CCS   Hx Prostate Cancer  On Xtandi at home  -hold home meds  Best Practice (right click and "Reselect all SmartList Selections"  daily)  Diet/type: NPO DVT prophylaxis: SCD GI prophylaxis: PPI Lines: Central line Foley:  Yes, and it is still needed Code Status:  full code Last date of multidisciplinary goals of care discussion - family updated per Dr. Silas Flood 8/19 on plan of care   Labs   CBC: Recent Labs  Lab 06/30/2021 0406 06/16/2021 1116  WBC 15.3*  --   NEUTROABS 13.3*  --   HGB 10.6* 7.6*  HCT 35.2* 25.0*  MCV 88.0  --   PLT 462*  --     Basic Metabolic Panel: Recent Labs  Lab 06/28/2021 0406  NA 133*  K 3.8  CL 90*  CO2 32  GLUCOSE 136*  BUN 26*  CREATININE 0.88  CALCIUM 9.4   GFR: Estimated Creatinine Clearance: 69.8 mL/min (by C-G formula based on SCr of 0.88 mg/dL). Recent Labs  Lab 06/12/2021 0406  WBC 15.3*  LATICACIDVEN 1.1    Liver Function Tests: Recent Labs  Lab 06/06/2021 0406  AST 10*  ALT 7  ALKPHOS 88  BILITOT 0.7  PROT 6.7  ALBUMIN 3.1*   No results for input(s): LIPASE, AMYLASE in the last 168 hours. No results for input(s): AMMONIA in the last 168 hours.  ABG    Component Value Date/Time   PHART 7.358 06/19/2021 1130   PCO2ART 44.7 06/09/2021 1130   PO2ART 148 (H) 06/08/2021 1130   HCO3 24.5 07/01/2021 1130   ACIDBASEDEF 0.4 06/26/2021 1130   O2SAT 99.2 06/25/2021 1130     Coagulation Profile: No results for input(s): INR, PROTIME in the last 168 hours.  Cardiac Enzymes: No results for input(s): CKTOTAL, CKMB, CKMBINDEX, TROPONINI in the last 168 hours.  HbA1C: No results found for: HGBA1C  CBG: No results for input(s): GLUCAP in the last 168 hours.  Review of Systems:   Unable to complete as patient is altered on mechanical ventilation   Past Medical History:  He,  has a past medical history of COPD (chronic obstructive pulmonary disease) ([place]).   Surgical History:  History reviewed. No pertinent surgical history.   Social History:   reports that he has been smoking cigarettes. He has a 15.00 pack-year smoking history. He has never used  smokeless tobacco. He reports that he does not currently use drugs after having used the following drugs: Marijuana. He reports that he does not drink alcohol.   Family History:  His family history is not on file.   Allergies No Known Allergies   Home Medications  Prior to Admission medications   Medication Sig Start Date End Date Taking? Authorizing Provider  acetaminophen (TYLENOL) 500 MG tablet Take 500 mg by mouth every 6 (six) hours as needed for mild pain, fever or headache.   Yes [provider]  albuterol (VENTOLIN HFA) 108 (90 Base) MCG/ACT inhaler Inhale 1-2 puffs into the lungs every 6 (six) hours as needed for wheezing or shortness of breath.   Yes [provider]  alendronate (FOSAMAX) 70 MG tablet Take  70 mg by mouth once a week. Take with a full glass of water on an empty stomach.   Yes [provider]  amLODipine (NORVASC) 5 MG tablet Take 5 mg by mouth 2 (two) times daily.   Yes [provider]  aspirin EC 81 MG tablet Take 81 mg by mouth daily. Swallow whole.   Yes [provider]  atorvastatin (LIPITOR) 40 MG tablet Take 40 mg by mouth daily.   Yes [provider]  Cholecalciferol (VITAMIN D3) 50 MCG (2000 UT) capsule Take 2,000 Units by mouth daily.   Yes [provider]  clopidogrel (PLAVIX) 75 MG tablet Take 75 mg by mouth daily.   Yes [provider]  docusate sodium (COLACE) 100 MG capsule Take 100 mg by mouth 2 (two) times daily as needed for mild constipation.   Yes [provider]  ferrous sulfate 325 (65 FE) MG tablet Take 325 mg by mouth 3 (three) times a week. Monday, Wednesday, Friday   Yes [provider]  lisinopril (ZESTRIL) 20 MG tablet Take 20 mg by mouth in the morning and at bedtime.   Yes [provider]  metoprolol tartrate (LOPRESSOR) 25 MG tablet Take 12.5 mg by mouth 2 (two) times daily.   Yes [provider]  Tiotropium Bromide-Olodaterol  (STIOLTO RESPIMAT) 2.5-2.5 MCG/ACT AERS Inhale 1 puff into the lungs daily.   Yes [provider]  vitamin C (ASCORBIC ACID) 500 MG tablet Take 500 mg by mouth daily.   Yes [provider]  XTANDI 40 MG tablet Take 160 mg by mouth daily. 03/06/21  Yes [provider]     Critical care time:  15 minutes     Noe Gens, MSN, APRN, NP-C, AGACNP-BC [facility] 06/08/2021, 5:35 PM   Please see [email or web address] for pager details.   From 7A-7P if no response, please call [phone] After hours, please call ELink [phone]

## 2021-06-22 NOTE — ED Provider Notes (Signed)
Patient with COPD on home oxygen here with several day history of increasing abdominal distention and pain.  Last bowel movement was yesterday.  Chronically ill-appearing.  Abdomen is very distended and diffusely tender with voluntary guarding.  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  Right-sided colon appears to be ischemic and there is perforation in the right upper quadrant.  Discussed with Dr. Ninfa Linden of general surgery who will relay findings to oncoming surgeon at 7 AM.  Patient started on IV fluids and IV antibiotics. D/w patient and wife. He does want surgery if offered and wants to be a full code.  Blood pressure and mental status remained stable.  CRITICAL CARE Performed by: Ezequiel Essex Total critical care time: 45 minutes Critical care time was exclusive of separately billable procedures and treating other patients. Critical care was necessary to treat or prevent imminent or life-threatening deterioration. Critical care was time spent personally by me on the following activities: development of treatment plan with patient and/or surrogate as well as nursing, discussions with consultants, evaluation of patient's response to treatment, examination of patient, obtaining history from patient or surrogate, ordering and performing treatments and interventions, ordering and review of laboratory studies, ordering and review of radiographic studies, pulse oximetry and re-evaluation of patient's condition.    Ezequiel Essex, MD 06/07/2021 [phone]

## 2021-06-22 NOTE — ED Provider Notes (Signed)
Care transferred from [place], [place] at shift change. See note for full HPI.  In summation here with abd pain over last few days. Last BM yesterday. COPD on home O2 at baseline.  Pending CT AP Physical Exam  BP 134/77   Pulse 86   Temp 98.8 F (37.1 C) (Oral)   Resp 20   Ht 6' (1.829 m)   Wt 68 kg   SpO2 97%   BMI 20.34 kg/m   Physical Exam Vitals and nursing note reviewed.  Constitutional:      General: He is not in acute distress.    Appearance: He is well-developed. He is not ill-appearing or diaphoretic.  HENT:     Head: Atraumatic.  Eyes:     Pupils: Pupils are equal, round, and reactive to light.  Cardiovascular:     Rate and Rhythm: Normal rate and regular rhythm.  Pulmonary:     Effort: Pulmonary effort is normal. No respiratory distress.  Abdominal:     General: Bowel sounds are normal. There is no distension.     Palpations: Abdomen is soft.     Tenderness: There is generalized abdominal tenderness. There is guarding and rebound.  Musculoskeletal:        General: Normal range of motion.     Cervical back: Normal range of motion and neck supple.  Skin:    General: Skin is warm and dry.  Neurological:     General: No focal deficit present.     Mental Status: He is alert and oriented to person, place, and time.    ED Course/Procedures     .Critical Care  Date/Time: 06/11/2021 8:00 AM Performed by: Nettie Elm, PA-C Authorized by: Nettie Elm, PA-C   Critical care provider statement:    Critical care time (minutes):  31   Critical care was necessary to treat or prevent imminent or life-threatening deterioration of the following conditions:  Circulatory failure   Critical care was time spent personally by me on the following activities:  Discussions with consultants, evaluation of patient's response to treatment, examination of patient, ordering and performing treatments and interventions, ordering and review of laboratory studies, ordering  and review of radiographic studies, pulse oximetry, re-evaluation of patient's condition, obtaining history from patient or surrogate and review of old charts Labs Reviewed  CBC WITH DIFFERENTIAL/PLATELET - Abnormal; Notable for the following components:      Result Value   WBC 15.3 (*)    RBC 4.00 (*)    Hemoglobin 10.6 (*)    HCT 35.2 (*)    RDW 17.4 (*)    Platelets 462 (*)    Neutro Abs 13.3 (*)    Lymphs Abs 0.5 (*)    Monocytes Absolute 1.3 (*)    Abs Immature Granulocytes 0.20 (*)    All other components within normal limits  COMPREHENSIVE METABOLIC PANEL - Abnormal; Notable for the following components:   Sodium 133 (*)    Chloride 90 (*)    Glucose, Bld 136 (*)    BUN 26 (*)    Albumin 3.1 (*)    AST 10 (*)    All other components within normal limits  BRAIN NATRIURETIC PEPTIDE - Abnormal; Notable for the following components:   B Natriuretic Peptide 168.2 (*)    All other components within normal limits  RESP PANEL BY RT-PCR (FLU A&B, COVID) ARPGX2  LACTIC ACID, PLASMA  URINALYSIS, ROUTINE W REFLEX MICROSCOPIC   CT ABDOMEN PELVIS W CONTRAST  Result Date:  06/10/2021 CLINICAL DATA:  Abdominal pain for the past 2 days. EXAM: CT ABDOMEN AND PELVIS WITH CONTRAST TECHNIQUE: Multidetector CT imaging of the abdomen and pelvis was performed using the standard protocol following bolus administration of intravenous contrast. CONTRAST:  1m OMNIPAQUE IOHEXOL 350 MG/ML SOLN COMPARISON:  CTA abdomen and pelvis dated January 13, 2020. CT abdomen pelvis dated October 18, 2019. FINDINGS: Lower chest: Small right pleural effusion. Hepatobiliary: No focal liver abnormality is seen. No gallstones, gallbladder wall thickening, or biliary dilatation. Pancreas: Unremarkable. No pancreatic ductal dilatation or surrounding inflammatory changes. Spleen: Normal in size without focal abnormality. Adrenals/Urinary Tract: Unchanged bilateral adrenal gland thickening. Unchanged bilateral renal cysts.  Unchanged left renal staghorn calculus. No hydronephrosis. The bladder is unremarkable. Stomach/Bowel: Focal irregular wall thickening of the mid sigmoid colon (series 2, image 69). The remaining colon is dilated to this point. The right colon is markedly dilated with pneumatosis involving the cecum and ascending colon. No small bowel distension. Small hiatal hernia. The stomach is otherwise within normal limits. Vascular/Lymphatic: Unchanged infrarenal abdominal aortic aneurysm status post endograft repair. Aneurysm sac currently measures 5.8 x 4.8 cm, previously 5.9 x 4.7 cm. Extensive aortoiliac atherosclerotic calcification. No enlarged abdominal or pelvic lymph nodes. Reproductive: Prior prostatectomy. Other: Small volume pneumoperitoneum. Small amount of free fluid in the pelvis. Musculoskeletal: No acute or significant osseous findings. IMPRESSION: 1. 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 Evidence of bowel perforation with small volume pneumoperitoneum. 2. Unchanged infrarenal abdominal aortic aneurysm status post endograft repair. Aneurysm sac currently measures 5.8 x 4.8 cm, previously 5.9 x 4.7 cm. 3. Small right pleural effusion. 4. Unchanged left renal staghorn calculus. 5. Aortic Atherosclerosis (ICD10-I70.0). Critical Value/emergent results were called by telephone at the time of interpretation on 06/06/2021 at 6:52 am to [facility] who verbally acknowledged these results. Electronically Signed   By: WTitus DubinM.D.   On: 06/04/2021 06:55   DG Chest Port 1 View  Result Date: 06/21/2021 CLINICAL DATA:  Shortness of breath EXAM: PORTABLE CHEST 1 VIEW COMPARISON:  05/10/2021 FINDINGS: Normal heart size and mediastinal contours. Chronic reticulation of lung markings with hazy density. There is no edema, air bronchogram, effusion, or  pneumothorax. Vague increased density over the right lung is not convincing for pneumonia. No acute osseous finding IMPRESSION: Chronic lung disease.  No acute finding when compared to priors. Electronically Signed   By: JMonte FantasiaM.D.   On: 07/01/2021 05:17    MDM  Plan on FU on imaging.  CTAP with ischemic colitis, bowel mass and perforation.  Dr. BNinfa Lindenwith surgery consulted by attending Dr. RWyvonnia Dusky Will make oncoming team aware.  Patient is FULL CODE  Started on IV Abx and IVF. Hemodynamically stable.  Discussed with patient and wife in room.  The patient appears reasonably stabilized for admission considering the current resources, flow, and capabilities available in the ED at this time, and I doubt any other [facility] further screening and/or treatment in the ED prior to admission.        Stephine Langbehn A, PA-C 06/05/2021 0801    STruddie Hidden MD 06/10/2021 [phone]

## 2021-06-22 NOTE — Transfer of Care (Signed)
Immediate Anesthesia Transfer of Care Note  Patient: Daniel King  Procedure(s) Performed: EXPLORATORY LAPAROTOMY ABDOMINAL COLECTOMY WITH END ILEOSTOMY (Abdomen)  Patient Location: PACU  Anesthesia Type:General  Level of Consciousness: Patient remains intubated per anesthesia plan  Airway & Oxygen Therapy: Patient placed on Ventilator (see vital sign flow sheet for setting)  Post-op Assessment: Report given to RN and Post -op Vital signs reviewed and stable  Post vital signs: Reviewed and stable  Last Vitals:  Vitals Value Taken Time  BP    Temp    Pulse 78 06/23/2021 1043  Resp 16 06/27/2021 1043  SpO2 100 % 06/09/2021 1043  Vitals shown include unvalidated device data.  Last Pain:  Vitals:   06/25/2021 0729  TempSrc:   PainSc: Asleep         Complications: No notable events documented.

## 2021-06-22 NOTE — H&P (Signed)
Daniel King  03-15-1946 [id number]  CARE TEAM:  PCP: Henreitta Cea, MD  Outpatient Care Team: Patient Care Team: Henreitta Cea, MD as PCP - General (Family Medicine)  Inpatient Treatment Team: Treatment Team: Consulting Physician: Edison Pace, Md, MD   This patient is a 75 y.o.male who presents today for surgical evaluation at the request of Dr Wyvonnia Dusky.   Chief complaint / Reason for evaluation: Abdominal pain with pneumatosis and probable colon obstruction  75 year old gentleman with multiple medical problems including atrial fibrillation chronic anticoagulation on Plavix and oxygen pendant COPD.  2-day history of worsening abdominal pain and discomfort.  Came to emergency room.  Concern for peritonitis.  CAT scan shows diffusely dilated colon with pneumatosis of all the right colon and probable transition point at the rectosigmoid region consistent with colon obstruction with ischemic perforation.  Wife at bedside.  Patient with history of prior open prostatectomy many years ago.  No other abdominal surgeries.   Assessment  Daniel King  75 y.o. male  Day of Surgery  Procedure(s): OPEN COLECTOMY/ILEOSTOMY  Problem List:  Active Problems:   * No active hospital problems. *   Peritonitis with abdominal distention and evidence of pneumatosis and free air concerning for ischemic right colon perforation in setting of a rectosigmoid stricture.  Malignancy versus diverticular  Plan:  Patient requires emergent operative exploration.  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  He is fully anticoagulated Plavix so bleeding risks are increased but he cannot wait 5 days.  I recommended surgery now.  I cautioned that most likely he will be in the intensive care unit needing critical care help.  May need to stay on the ventilator.  He has major risk of multiple complications including death but I think his risk of death is imminent without surgery.  Patient and wife agree to  proceed with emergency surgery  The anatomy & physiology of the digestive tract was discussed.  The pathophysiology of perforation was discussed.  Differential diagnosis such as perforated ulcer or colon, etc was discussed.   Natural history risks without surgery such as death was discussed.  I recommended abdominal exploration to diagnose & treat the source of the problem.  Laparoscopic & open techniques were discussed.   Risks such as bleeding, infection, abscess, leak, reoperation, bowel resection, possible ostomy, injury to other organs, need for repair of tissues / organs, hernia, heart attack, death, and other risks were discussed.   The risks of no intervention will lead to serious problems including death.   I expressed a good likelihood that surgery will address the problem.    Goals of post-operative recovery were discussed as well.  We will work to minimize complications although risks in an emergent setting are high.   Questions were answered.  The patient expressed understanding & wishes to proceed with surgery.         20 minutes spent in review, evaluation, examination, counseling, and coordination of care.  More than 50% of that time was spent in counseling.  [name], MD, FACS, MASCRS Esophageal, Gastrointestinal & Colorectal Surgery Robotic and Minimally Invasive Surgery  [facility], [place]  [place]. [address], [place], [place] [address] [phone] Fax [phone] Main  CONTACT INFORMATION:  Weekday (9AM-5PM): Call [facility] main office at [phone]  Weeknight (5PM-9AM) or Weekend/Holiday: Check [email or web address] (password " [email or web address]") for General Surgery [facility] coverage  (Please, do not use SecureChat as it is not reliable communication  to operating surgeons for immediate patient care)      07/04/2021      Past Medical History:  Diagnosis Date   COPD (chronic obstructive pulmonary disease) ([place])      History reviewed. No pertinent surgical history.  Social History   Socioeconomic History   Marital status: Married    Spouse name: Not on file   Number of children: Not on file   Years of education: Not on file   Highest education level: Not on file  Occupational History   Not on file  Tobacco Use   Smoking status: Every Day    Packs/day: 0.25    Years: 60.00    Pack years: 15.00    Types: Cigarettes   Smokeless tobacco: Never  Vaping Use   Vaping Use: Never used  Substance and Sexual Activity   Alcohol use: Never   Drug use: Not Currently    Types: Marijuana   Sexual activity: Yes  Other Topics Concern   Not on file  Social History Narrative   Not on file   Social Determinants of Health   Financial Resource Strain: Not on file  Food Insecurity: Not on file  Transportation Needs: Not on file  Physical Activity: Not on file  Stress: Not on file  Social Connections: Not on file  Intimate Partner Violence: Not on file    History reviewed. No pertinent family history.  Current Facility-Administered Medications  Medication Dose Route Frequency Provider Last Rate Last Admin   0.9 %  sodium chloride infusion   Intravenous Continuous Rancour, Stephen, MD       cefoTEtan (CEFOTAN) 2 g in sodium chloride 0.9 % 100 mL IVPB  2 g Intravenous On Call to OR Michael [place], MD       sodium chloride 0.9 % with cefoTEtan (CEFOTAN) ADS Med              No Known Allergies  ROS:   All other systems reviewed & are negative except per HPI or as noted below: Constitutional:  No fevers, chills, sweats.  Weight loss Eyes:  No vision changes, No discharge HENT:  No sore throats, nasal drainage Lymph: No neck swelling, No bruising easily Pulmonary:  No cough, productive sputum CV: No orthopnea, PND  Patient walks 1 block.  No exertional chest/neck/shoulder/arm pain.  ++DOE GI:  No personal nor family history of GI/colon cancer, inflammatory bowel disease, irritable bowel  syndrome, allergy such as Celiac Sprue, dietary/dairy problems, colitis, ulcers nor gastritis.  No recent sick contacts/gastroenteritis.  No travel outside the country.  No changes in diet. Renal: No UTIs, No hematuria Genital:  No drainage, bleeding, masses Musculoskeletal: No severe joint pain.  Good ROM major joints Skin:  No sores or lesions.  No rashes Heme/Lymph:  No easy bleeding.  No swollen lymph nodes Neuro: No focal weakness/numbness.  No seizures Psych: No suicidal ideation.  No hallucinations  BP 136/79   Pulse 91   Temp 98.8 F (37.1 C) (Oral)   Resp 18   Ht 6' (1.829 m)   Wt 68 kg   SpO2 98%   BMI 20.34 kg/m   Physical Exam:  Constitutional: Not cachectic.  Hygeine adequate.  Vitals signs as above.   Eyes: Pupils reactive, normal extraocular movements. Sclera nonicteric Neuro: CN II-XII intact.  No major focal sensory defects.  No major motor deficits. Lymph: No head/neck/groin lymphadenopathy Psych:  No severe agitation.  No severe anxiety.  Judgment & insight Adequate, Oriented x4, HENT:  Normocephalic, Mucus membranes moist.  No thrush.   Neck: Supple, No tracheal deviation.  No obvious thyromegaly Chest: No pain to chest wall compression.  Good respiratory excursion.  No audible wheezing CV:  Pulses intact.  Regular rhythm.  No major extremity edema  Abdomen:   Hernia: Not present. Diastasis recti: Large supraumbilical midline. Mostly firm.   Very distended.  Tenderness at RLQ w peritonitis .  No hepatomegaly.  No splenomegaly  Gen:  Inguinal hernia: Not present.  Inguinal lymph nodes: without lymphadenopathy.    Rectal: (Deferred)  Ext: No obvious deformity or contracture.  Edema: Not present.  No cyanosis Skin: No major subcutaneous nodules.  Warm and dry Musculoskeletal: Severe joint rigidity not present.  No obvious clubbing.  No digital petechiae.     Results:   Labs: Results for orders placed or performed during the hospital encounter of 06/25/2021  (from the past 48 hour(s))  CBC with Differential     Status: Abnormal   Collection Time: 06/14/2021  4:06 AM  Result Value Ref Range   WBC 15.3 (H) 4.0 - 10.5 K/uL   RBC 4.00 (L) 4.22 - 5.81 MIL/uL   Hemoglobin 10.6 (L) 13.0 - 17.0 g/dL   HCT 35.2 (L) 39.0 - 52.0 %   MCV 88.0 80.0 - 100.0 fL   MCH 26.5 26.0 - 34.0 pg   MCHC 30.1 30.0 - 36.0 g/dL   RDW 17.4 (H) 11.5 - 15.5 %   Platelets 462 (H) 150 - 400 K/uL   nRBC 0.0 0.0 - 0.2 %   Neutrophils Relative % 88 %   Neutro Abs 13.3 (H) 1.7 - 7.7 K/uL   Lymphocytes Relative 3 %   Lymphs Abs 0.5 (L) 0.7 - 4.0 K/uL   Monocytes Relative 8 %   Monocytes Absolute 1.3 (H) 0.1 - 1.0 K/uL   Eosinophils Relative 0 %   Eosinophils Absolute 0.0 0.0 - 0.5 K/uL   Basophils Relative 0 %   Basophils Absolute 0.0 0.0 - 0.1 K/uL   Immature Granulocytes 1 %   Abs Immature Granulocytes 0.20 (H) 0.00 - 0.07 K/uL    Comment: Performed at [facility], [place] [address] [place], [place] [address]  Comprehensive metabolic panel     Status: Abnormal   Collection Time: 06/12/2021  4:06 AM  Result Value Ref Range   Sodium 133 (L) 135 - 145 mmol/L   Potassium 3.8 3.5 - 5.1 mmol/L   Chloride 90 (L) 98 - 111 mmol/L   CO2 32 22 - 32 mmol/L   Glucose, Bld 136 (H) 70 - 99 mg/dL    Comment: Glucose reference range applies only to samples taken after fasting for at least 8 hours.   BUN 26 (H) 8 - 23 mg/dL   Creatinine, Ser 0.88 0.61 - 1.24 mg/dL   Calcium 9.4 8.9 - 10.3 mg/dL   Total Protein 6.7 6.5 - 8.1 g/dL   Albumin 3.1 (L) 3.5 - 5.0 g/dL   AST 10 (L) 15 - 41 U/L   ALT 7 0 - 44 U/L   Alkaline Phosphatase 88 38 - 126 U/L   Total Bilirubin 0.7 0.3 - 1.2 mg/dL   GFR, Estimated >60 >60 mL/min    Comment: (NOTE) Calculated using the CKD-EPI Creatinine Equation (2021)    Anion gap 11 5 - 15    Comment: Performed at [facility], [place] [address], [place], [place] [address]  Lactic acid, plasma     Status: None  Collection  Time: 06/10/2021  4:06 AM  Result Value Ref Range   Lactic Acid, Venous 1.1 0.5 - 1.9 mmol/L    Comment: Performed at [facility], [place] [address] [place], [place] [address]  Brain natriuretic peptide     Status: Abnormal   Collection Time: 06/26/2021  4:07 AM  Result Value Ref Range   B Natriuretic Peptide 168.2 (H) 0.0 - 100.0 pg/mL    Comment: Performed at [facility], [place] [address] [place], [place] [address]  Resp Panel by RT-PCR (Flu A&B, Covid) Nasopharyngeal Swab     Status: None   Collection Time: 06/14/2021  6:37 AM   Specimen: Nasopharyngeal Swab; Nasopharyngeal(NP) swabs in vial transport medium  Result Value Ref Range   SARS Coronavirus 2 by RT PCR NEGATIVE NEGATIVE    Comment: (NOTE) SARS-CoV-2 target nucleic acids are NOT DETECTED.  The SARS-CoV-2 RNA is generally detectable in upper respiratory specimens during the acute phase of infection. The lowest concentration of SARS-CoV-2 viral copies this assay can detect is 138 copies/mL. A negative result does not preclude SARS-Cov-2 infection and should not be used as the sole basis for treatment or other patient management decisions. A negative result may occur with  improper specimen collection/handling, submission of specimen other than nasopharyngeal swab, presence of viral mutation(s) within the areas targeted by this assay, and inadequate number of viral copies(<138 copies/mL). A negative result must be combined with clinical observations, patient history, and epidemiological information. The expected result is Negative.  Fact Sheet for Patients:  EntrepreneurPulse.com.au  Fact Sheet for Healthcare Providers:  IncredibleEmployment.be  This test is no t yet approved or cleared by the [place] FDA and  has been authorized for detection and/or diagnosis of SARS-CoV-2 by FDA under an Emergency Use Authorization (EUA). This EUA will remain  in  effect (meaning this test can be used) for the duration of the COVID-19 declaration under Section 564(b)(1) of the Act, 21 U.S.C.section 360bbb-3(b)(1), unless the authorization is terminated  or revoked sooner.       Influenza A by PCR NEGATIVE NEGATIVE   Influenza B by PCR NEGATIVE NEGATIVE    Comment: (NOTE) The Xpert Xpress SARS-CoV-2/FLU/RSV plus assay is intended as an aid in the diagnosis of influenza from Nasopharyngeal swab specimens and should not be used as a sole basis for treatment. Nasal washings and aspirates are unacceptable for Xpert Xpress SARS-CoV-2/FLU/RSV testing.  Fact Sheet for Patients: EntrepreneurPulse.com.au  Fact Sheet for Healthcare Providers: IncredibleEmployment.be  This test is not yet approved or cleared by the [place] FDA and has been authorized for detection and/or diagnosis of SARS-CoV-2 by FDA under an Emergency Use Authorization (EUA). This EUA will remain in effect (meaning this test can be used) for the duration of the COVID-19 declaration under Section 564(b)(1) of the Act, 21 U.S.C. section 360bbb-3(b)(1), unless the authorization is terminated or revoked.  Performed at [facility], [place] [address] [place], [place] [address]     Imaging / Studies: CT ABDOMEN PELVIS W CONTRAST  Result Date: 06/23/2021 CLINICAL DATA:  Abdominal pain for the past 2 days. EXAM: CT ABDOMEN AND PELVIS WITH CONTRAST TECHNIQUE: Multidetector CT imaging of the abdomen and pelvis was performed using the standard protocol following bolus administration of intravenous contrast. CONTRAST:  46m OMNIPAQUE IOHEXOL 350 MG/ML SOLN COMPARISON:  CTA abdomen and pelvis dated January 13, 2020. CT abdomen pelvis dated October 18, 2019. FINDINGS: Lower chest: Small right pleural effusion. Hepatobiliary: No focal  liver abnormality is seen. No gallstones, gallbladder wall thickening, or biliary dilatation. Pancreas:  Unremarkable. No pancreatic ductal dilatation or surrounding inflammatory changes. Spleen: Normal in size without focal abnormality. Adrenals/Urinary Tract: Unchanged bilateral adrenal gland thickening. Unchanged bilateral renal cysts. Unchanged left renal staghorn calculus. No hydronephrosis. The bladder is unremarkable. Stomach/Bowel: Focal irregular wall thickening of the mid sigmoid colon (series 2, image 69). The remaining colon is dilated to this point. The right colon is markedly dilated with pneumatosis involving the cecum and ascending colon. No small bowel distension. Small hiatal hernia. The stomach is otherwise within normal limits. Vascular/Lymphatic: Unchanged infrarenal abdominal aortic aneurysm status post endograft repair. Aneurysm sac currently measures 5.8 x 4.8 cm, previously 5.9 x 4.7 cm. Extensive aortoiliac atherosclerotic calcification. No enlarged abdominal or pelvic lymph nodes. Reproductive: Prior prostatectomy. Other: Small volume pneumoperitoneum. Small amount of free fluid in the pelvis. Musculoskeletal: No acute or significant osseous findings. IMPRESSION: 1. 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 Evidence of bowel perforation with small volume pneumoperitoneum. 2. Unchanged infrarenal abdominal aortic aneurysm status post endograft repair. Aneurysm sac currently measures 5.8 x 4.8 cm, previously 5.9 x 4.7 cm. 3. Small right pleural effusion. 4. Unchanged left renal staghorn calculus. 5. Aortic Atherosclerosis (ICD10-I70.0). Critical Value/emergent results were called by telephone at the time of interpretation on 06/28/2021 at 6:52 am to provider [facility], who verbally acknowledged these results. Electronically Signed   By: Titus Dubin M.D.   On: 07/01/2021 06:55   DG Chest Port 1 View  Result Date: 07/04/2021 CLINICAL DATA:   Shortness of breath EXAM: PORTABLE CHEST 1 VIEW COMPARISON:  05/10/2021 FINDINGS: Normal heart size and mediastinal contours. Chronic reticulation of lung markings with hazy density. There is no edema, air bronchogram, effusion, or pneumothorax. Vague increased density over the right lung is not convincing for pneumonia. No acute osseous finding IMPRESSION: Chronic lung disease.  No acute finding when compared to priors. Electronically Signed   By: Monte Fantasia M.D.   On: 06/24/2021 05:17    Medications / Allergies: per chart  Antibiotics: Anti-infectives (From admission, onward)    Start     Dose/Rate Route Frequency Ordered Stop   06/09/2021 0745  cefoTEtan (CEFOTAN) 2 g in sodium chloride 0.9 % 100 mL IVPB        2 g 200 mL/hr over 30 Minutes Intravenous On call to O.R. 06/23/2021 0740 06/23/21 0559   06/13/2021 0745  sodium chloride 0.9 % with cefoTEtan (CEFOTAN) ADS Med       Note to Pharmacy: Charmayne Sheer   : cabinet override      06/10/2021 0745 06/26/2021 1959   07/01/2021 0700  piperacillin-tazobactam (ZOSYN) IVPB 3.375 g        3.375 g 100 mL/hr over 30 Minutes Intravenous  Once 06/28/2021 0653 06/30/2021 0733         Note: Portions of this report may have been transcribed using voice recognition software. Every effort was made to ensure accuracy; however, inadvertent computerized transcription errors may be present.   Any transcriptional errors that result from this process are unintentional.    [name], MD, FACS, MASCRS Esophageal, Gastrointestinal & Colorectal Surgery Robotic and Minimally Invasive Surgery  [facility], [place]  [place]. [address], [place], [place] [address] [phone] Fax [phone] Main  CONTACT INFORMATION:  Weekday (9AM-5PM):  Call [facility] main office at [phone]  Weeknight (5PM-9AM) or Weekend/Holiday: Check [email or web address] (password " [email or web address]") for General Surgery [facility]  coverage  (Please, do not use SecureChat as it is not reliable communication to operating surgeons for immediate patient care)        06/27/2021  8:10 AM

## 2021-06-22 NOTE — Interval H&P Note (Signed)
History and Physical Interval Note:  06/05/2021 8:14 AM  Daniel King  has presented today for surgery, with the diagnosis of PERFORATED BOWEL.  The various methods of treatment have been discussed with the patient and family. After consideration of risks, benefits and other options for treatment, the patient has consented to  Procedure(s): OPEN COLECTOMY/ILEOSTOMY (N/A) as a surgical intervention.  The patient's history has been reviewed, patient examined, no change in status, stable for surgery.  I have reviewed the patient's chart and labs.  Questions were answered to the patient's satisfaction.    I have re-reviewed the the patient's records, history, medications, and allergies.  I have re-examined the patient.  I again discussed intraoperative plans and goals of post-operative recovery.  The patient agrees to proceed.  Daniel King  [birth year] [id number]  Patient Care Team: Henreitta Cea, MD as PCP - General (Family Medicine)  There are no problems to display for this patient.   Past Medical History:  Diagnosis Date   COPD (chronic obstructive pulmonary disease) ([place])     History reviewed. No pertinent surgical history.  Social History   Socioeconomic History   Marital status: Married    Spouse name: Not on file   Number of children: Not on file   Years of education: Not on file   Highest education level: Not on file  Occupational History   Not on file  Tobacco Use   Smoking status: Every Day    Packs/day: 0.25    Years: 60.00    Pack years: 15.00    Types: Cigarettes   Smokeless tobacco: Never  Vaping Use   Vaping Use: Never used  Substance and Sexual Activity   Alcohol use: Never   Drug use: Not Currently    Types: Marijuana   Sexual activity: Yes  Other Topics Concern   Not on file  Social History Narrative   Not on file   Social Determinants of Health   Financial Resource Strain: Not on file  Food Insecurity: Not on file  Transportation Needs:  Not on file  Physical Activity: Not on file  Stress: Not on file  Social Connections: Not on file  Intimate Partner Violence: Not on file    History reviewed. No pertinent family history.  No medications prior to admission.    Current Facility-Administered Medications  Medication Dose Route Frequency Provider Last Rate Last Admin   0.9 %  sodium chloride infusion   Intravenous Continuous Rancour, Annie Main, MD       cefoTEtan (CEFOTAN) 2 g in sodium chloride 0.9 % 100 mL IVPB  2 g Intravenous On Call to OR Michael Boston, MD       lactated ringers infusion   Intravenous Continuous Stechschulte, Nickola Major, MD 50 mL/hr at 06/10/2021 0813 New Bag at 06/30/2021 0813   sodium chloride 0.9 % with cefoTEtan (CEFOTAN) ADS Med              No Known Allergies  BP 136/79   Pulse 91   Temp 98.8 F (37.1 C) (Oral)   Resp 18   Ht 6' (1.829 m)   Wt 68 kg   SpO2 98%   BMI 20.34 kg/m   Labs: Results for orders placed or performed during the hospital encounter of 06/29/2021 (from the past 48 hour(s))  CBC with Differential     Status: Abnormal   Collection Time: 06/18/2021  4:06 AM  Result Value Ref Range   WBC 15.3 (H) 4.0 - 10.5 K/uL  RBC 4.00 (L) 4.22 - 5.81 MIL/uL   Hemoglobin 10.6 (L) 13.0 - 17.0 g/dL   HCT 35.2 (L) 39.0 - 52.0 %   MCV 88.0 80.0 - 100.0 fL   MCH 26.5 26.0 - 34.0 pg   MCHC 30.1 30.0 - 36.0 g/dL   RDW 17.4 (H) 11.5 - 15.5 %   Platelets 462 (H) 150 - 400 K/uL   nRBC 0.0 0.0 - 0.2 %   Neutrophils Relative % 88 %   Neutro Abs 13.3 (H) 1.7 - 7.7 K/uL   Lymphocytes Relative 3 %   Lymphs Abs 0.5 (L) 0.7 - 4.0 K/uL   Monocytes Relative 8 %   Monocytes Absolute 1.3 (H) 0.1 - 1.0 K/uL   Eosinophils Relative 0 %   Eosinophils Absolute 0.0 0.0 - 0.5 K/uL   Basophils Relative 0 %   Basophils Absolute 0.0 0.0 - 0.1 K/uL   Immature Granulocytes 1 %   Abs Immature Granulocytes 0.20 (H) 0.00 - 0.07 K/uL    Comment: Performed at [facility], [place] [address]  [place], [place] [address]  Comprehensive metabolic panel     Status: Abnormal   Collection Time: 06/28/2021  4:06 AM  Result Value Ref Range   Sodium 133 (L) 135 - 145 mmol/L   Potassium 3.8 3.5 - 5.1 mmol/L   Chloride 90 (L) 98 - 111 mmol/L   CO2 32 22 - 32 mmol/L   Glucose, Bld 136 (H) 70 - 99 mg/dL    Comment: Glucose reference range applies only to samples taken after fasting for at least 8 hours.   BUN 26 (H) 8 - 23 mg/dL   Creatinine, Ser 0.88 0.61 - 1.24 mg/dL   Calcium 9.4 8.9 - 10.3 mg/dL   Total Protein 6.7 6.5 - 8.1 g/dL   Albumin 3.1 (L) 3.5 - 5.0 g/dL   AST 10 (L) 15 - 41 U/L   ALT 7 0 - 44 U/L   Alkaline Phosphatase 88 38 - 126 U/L   Total Bilirubin 0.7 0.3 - 1.2 mg/dL   GFR, Estimated >60 >60 mL/min    Comment: (NOTE) Calculated using the CKD-EPI Creatinine Equation (2021)    Anion gap 11 5 - 15    Comment: Performed at [facility], [place] [address] [place], [place] [address]  Lactic acid, plasma     Status: None   Collection Time: 06/05/2021  4:06 AM  Result Value Ref Range   Lactic Acid, Venous 1.1 0.5 - 1.9 mmol/L    Comment: Performed at [facility], [place] [address], [place], [place] [address]  Brain natriuretic peptide     Status: Abnormal   Collection Time: 06/23/2021  4:07 AM  Result Value Ref Range   B Natriuretic Peptide 168.2 (H) 0.0 - 100.0 pg/mL    Comment: Performed at [facility], [place] [address] [place], [place] [address]  Resp Panel by RT-PCR (Flu A&B, Covid) Nasopharyngeal Swab     Status: None   Collection Time: 06/11/2021  6:37 AM   Specimen: Nasopharyngeal Swab; Nasopharyngeal(NP) swabs in vial transport medium  Result Value Ref Range   SARS Coronavirus 2 by RT PCR NEGATIVE NEGATIVE    Comment: (NOTE) SARS-CoV-2 target nucleic acids are NOT DETECTED.  The SARS-CoV-2 RNA is generally detectable in upper respiratory specimens during the acute phase of infection. The lowest concentration of  SARS-CoV-2 viral copies this assay can detect is 138 copies/mL. A negative result does not preclude  SARS-Cov-2 infection and should not be used as the sole basis for treatment or other patient management decisions. A negative result may occur with  improper specimen collection/handling, submission of specimen other than nasopharyngeal swab, presence of viral mutation(s) within the areas targeted by this assay, and inadequate number of viral copies(<138 copies/mL). A negative result must be combined with clinical observations, patient history, and epidemiological information. The expected result is Negative.  Fact Sheet for Patients:  EntrepreneurPulse.com.au  Fact Sheet for Healthcare Providers:  IncredibleEmployment.be  This test is no t yet approved or cleared by the [place] FDA and  has been authorized for detection and/or diagnosis of SARS-CoV-2 by FDA under an Emergency Use Authorization (EUA). This EUA will remain  in effect (meaning this test can be used) for the duration of the COVID-19 declaration under Section 564(b)(1) of the Act, 21 U.S.C.section 360bbb-3(b)(1), unless the authorization is terminated  or revoked sooner.       Influenza A by PCR NEGATIVE NEGATIVE   Influenza B by PCR NEGATIVE NEGATIVE    Comment: (NOTE) The Xpert Xpress SARS-CoV-2/FLU/RSV plus assay is intended as an aid in the diagnosis of influenza from Nasopharyngeal swab specimens and should not be used as a sole basis for treatment. Nasal washings and aspirates are unacceptable for Xpert Xpress SARS-CoV-2/FLU/RSV testing.  Fact Sheet for Patients: EntrepreneurPulse.com.au  Fact Sheet for Healthcare Providers: IncredibleEmployment.be  This test is not yet approved or cleared by the [place] FDA and has been authorized for detection and/or diagnosis of SARS-CoV-2 by FDA under an Emergency Use Authorization (EUA).  This EUA will remain in effect (meaning this test can be used) for the duration of the COVID-19 declaration under Section 564(b)(1) of the Act, 21 U.S.C. section 360bbb-3(b)(1), unless the authorization is terminated or revoked.  Performed at [facility], [place] [address] [place], [place] [address]     Imaging / Studies: CT ABDOMEN PELVIS W CONTRAST  Result Date: 06/19/2021 CLINICAL DATA:  Abdominal pain for the past 2 days. EXAM: CT ABDOMEN AND PELVIS WITH CONTRAST TECHNIQUE: Multidetector CT imaging of the abdomen and pelvis was performed using the standard protocol following bolus administration of intravenous contrast. CONTRAST:  83m OMNIPAQUE IOHEXOL 350 MG/ML SOLN COMPARISON:  CTA abdomen and pelvis dated January 13, 2020. CT abdomen pelvis dated October 18, 2019. FINDINGS: Lower chest: Small right pleural effusion. Hepatobiliary: No focal liver abnormality is seen. No gallstones, gallbladder wall thickening, or biliary dilatation. Pancreas: Unremarkable. No pancreatic ductal dilatation or surrounding inflammatory changes. Spleen: Normal in size without focal abnormality. Adrenals/Urinary Tract: Unchanged bilateral adrenal gland thickening. Unchanged bilateral renal cysts. Unchanged left renal staghorn calculus. No hydronephrosis. The bladder is unremarkable. Stomach/Bowel: Focal irregular wall thickening of the mid sigmoid colon (series 2, image 69). The remaining colon is dilated to this point. The right colon is markedly dilated with pneumatosis involving the cecum and ascending colon. No small bowel distension. Small hiatal hernia. The stomach is otherwise within normal limits. Vascular/Lymphatic: Unchanged infrarenal abdominal aortic aneurysm status post endograft repair. Aneurysm sac currently measures 5.8 x 4.8 cm, previously 5.9 x 4.7 cm. Extensive aortoiliac atherosclerotic calcification. No enlarged abdominal or pelvic lymph nodes. Reproductive: Prior prostatectomy.  Other: Small volume pneumoperitoneum. Small amount of free fluid in the pelvis. Musculoskeletal: No acute or significant osseous findings. IMPRESSION: 1. 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 Evidence of bowel perforation  with small volume pneumoperitoneum. 2. Unchanged infrarenal abdominal aortic aneurysm status post endograft repair. Aneurysm sac currently measures 5.8 x 4.8 cm, previously 5.9 x 4.7 cm. 3. Small right pleural effusion. 4. Unchanged left renal staghorn calculus. 5. Aortic Atherosclerosis (ICD10-I70.0). Critical Value/emergent results were called by telephone at the time of interpretation on 06/23/2021 at 6:52 am to provider [facility], [facility], [facility], who verbally acknowledged these results. Electronically Signed   By: Titus Dubin M.D.   On: 07/02/2021 06:55   DG Chest Port 1 View  Result Date: 07/04/2021 CLINICAL DATA:  Shortness of breath EXAM: PORTABLE CHEST 1 VIEW COMPARISON:  05/10/2021 FINDINGS: Normal heart size and mediastinal contours. Chronic reticulation of lung markings with hazy density. There is no edema, air bronchogram, effusion, or pneumothorax. Vague increased density over the right lung is not convincing for pneumonia. No acute osseous finding IMPRESSION: Chronic lung disease.  No acute finding when compared to priors. Electronically Signed   By: Monte Fantasia M.D.   On: 06/05/2021 05:17     .[name], M.D., F.A.C.S. Gastrointestinal and Minimally Invasive Surgery [facility] [address], [place], [place] [address] [phone] Main / Paging  06/14/2021 8:14 AM    [name]

## 2021-06-22 NOTE — Anesthesia Procedure Notes (Signed)
Procedure Name: Intubation Date/Time: 06/18/2021 8:54 AM Performed by: Gean Maidens, CRNA Pre-anesthesia Checklist: Patient identified, Emergency Drugs available, Suction available, Patient being monitored and Timeout performed Patient Re-evaluated:Patient Re-evaluated prior to induction Oxygen Delivery Method: Circle system utilized Preoxygenation: Pre-oxygenation with 100% oxygen Induction Type: IV induction and Rapid sequence Laryngoscope Size: Mac and 4 Grade View: Grade I Tube type: Oral Tube size: 7.5 mm Number of attempts: 1 Airway Equipment and Method: Stylet Placement Confirmation: ETT inserted through vocal cords under direct vision, positive ETCO2 and breath sounds checked- equal and bilateral Secured at: 23 cm Tube secured with: Tape Dental Injury: Teeth and Oropharynx as per pre-operative assessment

## 2021-06-22 NOTE — Anesthesia Preprocedure Evaluation (Addendum)
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 Bowel sounds: decreased.  Peds  Hematology negative hematology ROS (+)   Anesthesia Other Findings   Reproductive/Obstetrics                            Anesthesia Physical Anesthesia Plan  ASA: 3 and emergent  Anesthesia Plan: General   Post-op Pain Management:    Induction: Rapid sequence and Intravenous  PONV Risk Score and Plan: 1 and Ondansetron, Dexamethasone and Treatment may vary due to age or medical condition  Airway Management Planned: Mask and Oral ETT  Additional Equipment: None  Intra-op Plan:   Post-operative Plan: Possible Post-op intubation/ventilation  Informed Consent: I have reviewed the patients History and Physical, chart, labs and discussed the procedure including the risks, benefits and alternatives for the proposed anesthesia with the patient or authorized representative who has indicated his/her understanding and acceptance.     Dental advisory given  Plan Discussed with: CRNA  Anesthesia Plan Comments: (Lab Results      Component                Value               Date                      WBC                      15.3 (H)             06/19/2021                HGB                      10.6 (L)            06/17/2021                HCT                      35.2 (L)            06/21/2021                MCV                      88.0                06/27/2021                PLT                      462 (H)  06/29/2021           Lab Results      Component                Value               Date                      NA                       133 (L)             06/28/2021                K                        3.8                 06/05/2021                CO2                      32                  06/06/2021                GLUCOSE                  136 (H)             06/25/2021                BUN                      26 (H)              06/12/2021                CREATININE               0.88                06/12/2021                CALCIUM                  9.4                 06/13/2021                GFRNONAA                 >60                 06/04/2021          )       Anesthesia Quick Evaluation

## 2021-06-22 NOTE — ED Triage Notes (Signed)
Pt to ED via [facility] with c/o abdominal pain since yesterday, worsening this morning.  Last BM yesterday, last BM prior was one week ago.  Pt denies hx of same pain/distention.

## 2021-06-22 NOTE — Progress Notes (Signed)
Initial Nutrition Assessment  DOCUMENTATION CODES:   Non-severe (moderate) malnutrition in context of chronic illness  INTERVENTION:  - will monitor for plan concerning nutrition.    NUTRITION DIAGNOSIS:   Moderate Malnutrition related to chronic illness (COPD) as evidenced by mild fat depletion, mild muscle depletion, moderate muscle depletion.  GOAL:   Patient will meet greater than or equal to 90% of their needs  MONITOR:   Vent status, Labs, Weight trends, Skin, I & O's  REASON FOR ASSESSMENT:   Ventilator  ASSESSMENT:   75 year-old male with medical history of COPD. He presented to the ED due to worsening abdominal pain. CT showed diffuse dilated colon with pneumatosis consistent with colon obstruction with ischemic perforation.  No family or visitors present. Patient is POD #0 ex lap with colectomy and end ileostomy.   He remains intubated with NGT in R nare to LIS; no output in canister yet. Able to talk with RN at bedside.   Weight today documented as 150 lb, which appears to be a stated weight. No other weight documented in the chart. Weight +3.7 L since admission.    Patient is currently intubated on ventilator support MV: 8.5 L/min Temp (24hrs), Avg:95.4 F (35.2 C), Min:93 F (33.9 C), Max:98.8 F (37.1 C) Propofol: none BP: 138/61 and MAP: 85   Labs reviewed; Na: 133 mmol/l, Cl: 90 mmol/l, BUN: 26 mg/dl. Medications reviewed.  Drips; vaso @ 0.03 units/min, neo @ 70 mcg/min, levo @ 13 mcg/min, precedex @ 1 mcg/kg/hr.  IVF; LR @ 75 ml/hr.     NUTRITION - FOCUSED PHYSICAL EXAM:  Flowsheet Row Most Recent Value  Orbital Region Mild depletion  Upper Arm Region Moderate depletion  Thoracic and Lumbar Region Unable to assess  Buccal Region Mild depletion  Temple Region Mild depletion  Clavicle Bone Region Mild depletion  Clavicle and Acromion Bone Region Mild depletion  Scapular Bone Region Unable to assess  Dorsal Hand No depletion  Patellar  Region Moderate depletion  Anterior Thigh Region Moderate depletion  Posterior Calf Region Moderate depletion  Edema (RD Assessment) None  Hair Reviewed  Eyes Unable to assess  Skin Reviewed  Nails Reviewed       Diet Order:   Diet Order             Diet NPO time specified  Diet effective now                   EDUCATION NEEDS:   No education needs have been identified at this time  Skin:  Skin Assessment: Skin Integrity Issues: Skin Integrity Issues:: Incisions Incisions: abdomen (8/19)  Last BM:  PTA/unknown  Height:   Ht Readings from Last 1 Encounters:  06/13/2021 6' (1.829 m)    Weight:   Wt Readings from Last 1 Encounters:  07/01/2021 68 kg     Estimated Nutritional Needs:  Kcal:  1647 kcal Protein:  >/= 136 grams Fluid:  >/= 2 L/day      Jarome Matin, MS, RD, LDN, CNSC Inpatient Clinical Dietitian RD pager # available in [facility]  After hours/weekend pager # available in [facility]

## 2021-06-22 NOTE — Anesthesia Postprocedure Evaluation (Signed)
Anesthesia Post Note  Patient: Daniel King  Procedure(s) Performed: EXPLORATORY LAPAROTOMY ABDOMINAL COLECTOMY WITH END ILEOSTOMY (Abdomen)     Patient location during evaluation: SICU Anesthesia Type: General Level of consciousness: sedated Pain management: pain level controlled Vital Signs Assessment: post-procedure vital signs reviewed and stable Respiratory status: patient remains intubated per anesthesia plan Cardiovascular status: stable Postop Assessment: no apparent nausea or vomiting Anesthetic complications: no   No notable events documented.  Last Vitals:  Vitals:   06/11/2021 0730 06/17/2021 1038  BP: 136/79 137/63  Pulse: 91 73  Resp: 18 14  Temp:    SpO2: 98% 100%    Last Pain:  Vitals:   07/03/2021 0729  TempSrc:   PainSc: Asleep                 March Rummage Aubrey Voong

## 2021-06-22 NOTE — Progress Notes (Signed)
A consult was received from an ED physician for Zosyn per pharmacy dosing.  The patient's profile has been reviewed for ht/wt/allergies/indication/available labs.   A one time order has already been placed for Zosyn 3.375 grams IV x1.  Further antibiotics/pharmacy consults should be ordered by admitting physician if indicated.                       Thank you, Clayburn Pert, PharmD, [place] [phone] 06/29/2021  6:57 AM

## 2021-06-22 NOTE — ED Notes (Signed)
PT OFF FLOOR TO SHORT STAY.

## 2021-06-22 NOTE — ED Provider Notes (Signed)
[facility] DEPT Provider Note   CSN: [id number] Arrival date & time: 06/05/2021  0340     History Chief Complaint  Patient presents with   Abdominal Pain    Daniel King is a 75 y.o. male.  Patient presents to the emergency department with a chief complaint of right lower abdominal pain.  States his symptoms started about 6 hours ago.  Rates the pain is severe.  Reports an episode of vomiting.  States that he has never had pain like this before.  Denies difficulty with urination.  Last bowel movement was yesterday.  Reports some shortness of breath, but states this is normal for him secondary to his COPD.  He is on chronic O2.  The history is provided by the patient. No language interpreter was used.      Past Medical History:  Diagnosis Date   COPD (chronic obstructive pulmonary disease) ([place])     There are no problems to display for this patient.   History reviewed. No pertinent surgical history.     History reviewed. No pertinent family history.  Social History   Tobacco Use   Smoking status: Every Day    Packs/day: 0.25    Years: 60.00    Pack years: 15.00    Types: Cigarettes   Smokeless tobacco: Never  Vaping Use   Vaping Use: Never used  Substance Use Topics   Alcohol use: Never   Drug use: Not Currently    Types: Marijuana    Home Medications Prior to Admission medications   Not on File    Allergies    Patient has no known allergies.  Review of Systems   Review of Systems  All other systems reviewed and are negative.  Physical Exam Updated Vital Signs BP 111/68 (BP Location: Right Arm)   Pulse 80   Temp 98.8 F (37.1 C) (Oral)   Resp 20   Ht 6' (1.829 m)   Wt 68 kg   SpO2 96%   BMI 20.34 kg/m   Physical Exam Vitals and nursing note reviewed.  Constitutional:      Appearance: He is well-developed.  HENT:     Head: Normocephalic and atraumatic.  Eyes:     Conjunctiva/sclera: Conjunctivae normal.   Cardiovascular:     Rate and Rhythm: Normal rate and regular rhythm.     Heart sounds: No murmur heard. Pulmonary:     Effort: Pulmonary effort is normal. No respiratory distress.     Breath sounds: Normal breath sounds.  Abdominal:     General: There is distension.     Palpations: Abdomen is soft.     Tenderness: There is abdominal tenderness. There is guarding.  Musculoskeletal:     Cervical back: Neck supple.  Skin:    General: Skin is warm and dry.  Neurological:     Mental Status: He is alert.    ED Results / Procedures / Treatments   Labs (all labs ordered are listed, but only abnormal results are displayed) Labs Reviewed  CBC WITH DIFFERENTIAL/PLATELET  COMPREHENSIVE METABOLIC PANEL  LACTIC ACID, PLASMA  LACTIC ACID, PLASMA  BRAIN NATRIURETIC PEPTIDE  URINALYSIS, ROUTINE W REFLEX MICROSCOPIC    EKG None  Radiology No results found.  Procedures .Critical Care  Date/Time: 06/23/2021 10:02 PM Performed by: Montine Circle, PA-C Authorized by: Montine Circle, PA-C   Critical care provider statement:    Critical care time (minutes):  45   Critical care was necessary to treat or prevent imminent  or life-threatening deterioration of the following conditions:  Circulatory failure   Critical care was time spent personally by me on the following activities:  Discussions with consultants, evaluation of patient's response to treatment, examination of patient, ordering and performing treatments and interventions, ordering and review of laboratory studies, ordering and review of radiographic studies, pulse oximetry, re-evaluation of patient's condition, obtaining history from patient or surrogate and review of old charts   Medications Ordered in ED Medications - No data to display  ED Course  I have reviewed the triage vital signs and the nursing notes.  Pertinent labs & imaging results that were available during my care of the patient were reviewed by me and  considered in my medical decision making (see chart for details).    MDM Rules/Calculators/A&P                           Patient here with abdominal pain and distention.  Check bladder scan.  Will check CT given new distention and pain.  He is afebrile.  We will check labs.  Will reassess.  Last BM was yesterday.  Patient denies urinary complaints.  He has history of COPD, and states that his shortness of breath is about baseline for him.  Patient signed out at shift change to [facility], PA-C, who will continue care. Final Clinical Impression(s) / ED Diagnoses Final diagnoses:  Bowel perforation ([place])  Ischemic colitis [facility])  Mass of colon    Rx / DC Orders ED Discharge Orders     None        Montine Circle, Hershal Coria 06/23/21 2202    Ezequiel Essex, MD 06/24/21 1523

## 2021-06-22 NOTE — Procedures (Signed)
Central Venous Catheter Insertion Procedure Note  Daniel King  [id number]  1946/06/19  Date:06/06/2021  Time:4:18 PM   Provider Performing:Ammara Raj R Claudell Rhody   Procedure: Insertion of Non-tunneled Central Venous Catheter(36556) with US guidance [id number])   Indication(s) Medication administration  Consent Unable to obtain consent due to emergent nature of procedure.  Anesthesia Topical only with 1% lidocaine   Timeout Verified patient identification, verified procedure, site/side was marked, verified correct patient position, special equipment/implants available, medications/allergies/relevant history reviewed, required imaging and test results available.  Sterile Technique Maximal sterile technique including full sterile barrier drape, hand hygiene, sterile gown, sterile gloves, mask, hair covering, sterile ultrasound probe cover (if used).  Procedure Description Area of catheter insertion was cleaned with chlorhexidine and draped in sterile fashion.  With real-time ultrasound guidance a central venous catheter was placed into the left internal jugular vein. Nonpulsatile blood flow and easy flushing noted in all ports.  The catheter was sutured in place and sterile dressing applied.  Complications/Tolerance None; patient tolerated the procedure well. Chest X-ray is ordered to verify placement for internal jugular or subclavian cannulation.   Chest x-ray is not ordered for femoral cannulation.  EBL Minimal  Specimen(s) None

## 2021-06-23 ENCOUNTER — Inpatient Hospital Stay (HOSPITAL_COMMUNITY): Payer: No Typology Code available for payment source

## 2021-06-23 DIAGNOSIS — K55049 Acute infarction of large intestine, extent unspecified: Secondary | ICD-10-CM | POA: Diagnosis not present

## 2021-06-23 LAB — HEMOGLOBIN A1C
Hgb A1c MFr Bld: 5.6 % (ref 4.8–5.6)
Mean Plasma Glucose: 114.02 mg/dL

## 2021-06-23 LAB — CBC
HCT: 23 % — ABNORMAL LOW (ref 39.0–52.0)
Hemoglobin: 7.3 g/dL — ABNORMAL LOW (ref 13.0–17.0)
MCH: 27.5 pg (ref 26.0–34.0)
MCHC: 31.7 g/dL (ref 30.0–36.0)
MCV: 86.8 fL (ref 80.0–100.0)
Platelets: 308 10*3/uL (ref 150–400)
RBC: 2.65 MIL/uL — ABNORMAL LOW (ref 4.22–5.81)
RDW: 16.8 % — ABNORMAL HIGH (ref 11.5–15.5)
WBC: 13.7 10*3/uL — ABNORMAL HIGH (ref 4.0–10.5)
nRBC: 0.3 % — ABNORMAL HIGH (ref 0.0–0.2)

## 2021-06-23 LAB — GLUCOSE, CAPILLARY
Glucose-Capillary: 146 mg/dL — ABNORMAL HIGH (ref 70–99)
Glucose-Capillary: 156 mg/dL — ABNORMAL HIGH (ref 70–99)
Glucose-Capillary: 160 mg/dL — ABNORMAL HIGH (ref 70–99)
Glucose-Capillary: 153 mg/dL — ABNORMAL HIGH (ref 70–99)
Glucose-Capillary: 137 mg/dL — ABNORMAL HIGH (ref 70–99)

## 2021-06-23 LAB — COMPREHENSIVE METABOLIC PANEL
ALT: 38 U/L (ref 0–44)
AST: 64 U/L — ABNORMAL HIGH (ref 15–41)
Albumin: 2.2 g/dL — ABNORMAL LOW (ref 3.5–5.0)
Alkaline Phosphatase: 59 U/L (ref 38–126)
Anion gap: 5 (ref 5–15)
BUN: 33 mg/dL — ABNORMAL HIGH (ref 8–23)
CO2: 25 mmol/L (ref 22–32)
Calcium: 7.2 mg/dL — ABNORMAL LOW (ref 8.9–10.3)
Chloride: 101 mmol/L (ref 98–111)
Creatinine, Ser: 1.22 mg/dL (ref 0.61–1.24)
GFR, Estimated: >60 mL/min (ref >60)
Glucose, Bld: 162 mg/dL — ABNORMAL HIGH (ref 70–99)
Potassium: 3.8 mmol/L (ref 3.5–5.1)
Sodium: 131 mmol/L — ABNORMAL LOW (ref 135–145)
Total Bilirubin: 1.1 mg/dL (ref 0.3–1.2)
Total Protein: 4.5 g/dL — ABNORMAL LOW (ref 6.5–8.1)

## 2021-06-23 LAB — HEMOGLOBIN: Hemoglobin: 6.7 g/dL — CL (ref 13.0–17.0)

## 2021-06-23 LAB — MAGNESIUM: Magnesium: 1.7 mg/dL (ref 1.7–2.4)

## 2021-06-23 LAB — PREPARE RBC (CROSSMATCH)

## 2021-06-23 MED ORDER — SODIUM CHLORIDE 0.9% IV SOLUTION: Freq: Once | INTRAVENOUS | Status: AC

## 2021-06-23 MED ORDER — PANTOPRAZOLE SODIUM 40 MG IV SOLR
40.0000 mg | Freq: Every day | INTRAVENOUS | Status: DC
  Administered 2021-06-23 – 2021-06-27 (×5): 40 mg via INTRAVENOUS
  Filled 2021-06-23 (×5): qty 40

## 2021-06-23 MED ORDER — MAGNESIUM SULFATE 2 GM/50ML IV SOLN
2.0000 g | Freq: Once | INTRAVENOUS | Status: AC
  Administered 2021-06-23: 2 g via INTRAVENOUS
  Filled 2021-06-23: qty 50

## 2021-06-23 MED ORDER — LACTATED RINGERS IV BOLUS
1000.0000 mL | Freq: Once | INTRAVENOUS | Status: AC
  Administered 2021-06-23: 1000 mL via INTRAVENOUS

## 2021-06-23 NOTE — Progress Notes (Signed)
Date and time results received: 06/23/21 1432 (use smartphrase ".now" to insert current time)  Test: Hgb  Critical Value: 6.7  Name of Provider Notified: [place]  Orders Received? Or Actions Taken?:  MD to order 1 U PRBC

## 2021-06-23 NOTE — Progress Notes (Addendum)
Subjective 1U PRBC given. Remains intubated/sedate  Objective: Vital signs in last 24 hours: Temp:  [93 F (33.9 C)-100 F (37.8 C)] 98.1 F (36.7 C) (08/20 0715) Pulse Rate:  [51-79] 61 (08/20 0715) Resp:  [0-29] 16 (08/20 0715) BP: (64-186)/(16-99) 106/45 (08/20 0715) SpO2:  [85 %-100 %] 99 % (08/20 0802) FiO2 (%):  [40 %-100 %] 40 % (08/20 0802) Weight:  [67.9 kg] 67.9 kg (08/20 0456) Last BM Date:  (PTA)  Intake/Output from previous day: 08/19 0701 - 08/20 0700 In: 6906.9 [I.V.:5699.6; Blood:478.3; IV Piggyback:729] Out: 774 [Urine:524; Drains:150; Blood:100] Intake/Output this shift: No intake/output data recorded.  Gen: Intubated, sedate CV: RRR Pulm: Normal work of breathing Abd: Soft, not significantly distended; ileostomy pink with sweat in appliance. VAC in place with thin ss effluent Ext: SCDs in place  Lab Results: CBC  Recent Labs    07/04/2021 0406 07/04/2021 1116 06/27/2021 1701 06/23/21 0320  WBC 15.3*  --   --  13.7*  HGB 10.6*   < > 8.2* 7.3*  HCT 35.2*   < > 26.5* 23.0*  PLT 462*  --   --  308   < > = values in this interval not displayed.   BMET Recent Labs    06/10/2021 1701 06/23/21 0320  NA 134* 131*  K 4.0 3.8  CL 100 101  CO2 25 25  GLUCOSE 198* 162*  BUN 26* 33*  CREATININE 0.95 1.22  CALCIUM 7.7* 7.2*   PT/INR No results for input(s): LABPROT, INR in the last 72 hours. ABG Recent Labs    06/16/2021 1130  PHART 7.358  HCO3 24.5    Studies/Results:  Anti-infectives: Anti-infectives (From admission, onward)    Start     Dose/Rate Route Frequency Ordered Stop   06/06/2021 1400  piperacillin-tazobactam (ZOSYN) IVPB 3.375 g        3.375 g 12.5 mL/hr over 240 Minutes Intravenous Every 8 hours 06/12/2021 1357 06/27/21 1414   06/13/2021 0745  cefoTEtan (CEFOTAN) 2 g in sodium chloride 0.9 % 100 mL IVPB        2 g 200 mL/hr over 30 Minutes Intravenous On call to O.R. 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 Increased MIVF to 125cc/hr; anticipate 3rd space losses and volume shifts with having had large amount of colon removed (total abdominal colectomy), laparotomy. -Ppx: SCDs; PPI; ok for chemical dvt  ppx if hgb stable -Wife present at bedside this morning and updated, questions were answered   LOS: 1 day   Nadeen Landau, MD [facility] Use [email or web address] to contact on call provider

## 2021-06-23 NOTE — Progress Notes (Signed)
The patient was flipped back into PRVC mode. He appeared to be tachypnic, and not meeting target respirations. As well as desynchronous with the ventilator settings. He is tolerating change well at this time and resting. RT will continue to monitor.

## 2021-06-23 NOTE — Consult Note (Signed)
[facility] Nurse Consult Note: Reason for Consult: Ileostomy and open midline wound with NPWT .  Consult received and [place] nurse to make planned visit on Monday, 06/25/21.  [facility] nursing team will follow, and will remain available to this patient, the nursing and medical teams.    Thanks, Maudie Flakes, MSN, RN, [place], Arther Abbott  Pager# [phone]

## 2021-06-23 NOTE — Consult Note (Signed)
NAME:  Daniel King, MRN:  [id number], DOB:  Aug 13, 1946, LOS: 1 ADMISSION DATE:  06/19/2021, CONSULTATION DATE:  8/19 REFERRING MD:  Dr. Johney Maine, CHIEF COMPLAINT:  Post-operative respiratory failure    History of Present Illness:  75 y/o M who presented to [facility] on 8/19 with reports of 24 hours of abdominal pain that worsened the am of presentation.   He reported his last BM on 8/18, prior to that was one week ago.  On presentation he was note to have abdominal distention, tenderness.  CT of the abdomen was assessed with concerns for primary colon cancer in the mid sigmoid colon complicated by colonic obstruction with pneumatosis involving the cecum and ascending colon, consistent with ischemic colitis. Evidence of bowel perforation with small volume pneumoperitoneum.  He was evaluated by CCS and recommended for open colectomy / ileostomy.  He was on plavix prior to admit.  He was started on IV zosyn.  Intraoperative findings notable for massively dilated colon, cecum and ascending colon with ischemia and patches of phlegmon & gangrene with approximately 3 feet of colon removed, small volume pneumoperitoneum, rocky hard rectosigmoid mass worrisome for malignancy.  EBL estimated at 159m.  He had intraoperative hypotension requiring vasopressors.  The patient was returned to the ICU post surgery on mechanical ventilation. Post op Hgb 7.6.  The patient was given 1 unit PRBC.    PCCM consulted for assistance with ICU care.    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 Returned to ICU on vent.   Interim History / Subjective:  NAEON. UOP not great. Cr rise overnight. Remains on  pressors.  Objective   Blood pressure (!) 106/45, pulse 61, temperature 98.1 F (36.7 C), resp. rate 16, height 6' (1.829 m), weight 67.9 kg, SpO2 99 %. CVP:  [5 mmHg] 5 mmHg  Vent Mode: CPAP;PSV FiO2 (%):  [40 %-100 %] 40 % Set Rate:  [14 bmp] 14 bmp Vt Set:  [620 mL] 620 mL PEEP:  [5 cmH20] 5 cmH20 Pressure Support:  [15 cmH20] 15 cmH20 Plateau Pressure:  [13 cmH20-17 cmH20] 16 cmH20   Intake/Output Summary (Last 24 hours) at 06/23/2021 0818 Last data filed at 06/23/2021 0700 Gross per 24 hour  Intake 6906.94 ml  Output 774 ml  Net 6132.94 ml    Filed Weights   06/27/2021 0356 06/23/21 0456  Weight: 68 kg 67.9 kg    Examination: General: ill appearing adult male lying in bed in NAD on vent HENT: ETT, PERRL anicteric Lungs: non-labored on vent, lungs bilaterally distant but clear, barrel chest noted  Cardiovascular: s1s2 rrr, no m/r/g Abdomen: midline VAC in place, right colostomy with pink stoma, bloody drainage in ostomy bag Extremities: warm/dry, no edema Neuro: sedate    Resolved Hospital Problem list     Assessment & Plan:   Acute Bowel Perforation s/p Open Colectomy with Ileostomy Concern for malignancy based on intraoperative findings.  -post operative and wound care per CCS  -follow up pathology  -will need WOC assistance for teaching once able to perform self care of ostomy   Shock: Septic, hypovolemia, blood loss anemia. Hgb continues to drift down. --zosyn given intraabdominal source --MAP > 65, NE --  Trend Hgb, sp 1u PRBC 8/19  Acute Kidney Injury: UOP on the low side, Cr rising. Presumed ATN in setting of shock. --NE MAP > 65  Acute Hypoxic Respiratory Failure  COPD without AE -PRVC 8cc/kg  -wean PEEP / fiO2 for sats >90% -SBT / WUA in am -brovana + pulmicort BID   Sedation Needs while on Mechanical Ventilation  -RASS Goal -1 to -2 -PAD protocol with fentanyl, precedex   HTN, HLD -hold home metoprolol, norvasc, lipitor, lisinopril,  lopressor   Hx CVA -hold home plavix, ASA   At Risk Malnutrition  -defer timing of enteral feeding to CCS   Hx Prostate Cancer  On Xtandi at home  -hold home meds  Best Practice (right click and "Reselect all SmartList Selections" daily)  Diet/type: NPO DVT prophylaxis: SCD GI prophylaxis: PPI Lines: Central line Foley:  Yes, and it is still needed Code Status:  full code Last date of multidisciplinary goals of care discussion - family updated per Dr. Silas Flood 8/19 on plan of care   Labs   CBC: Recent Labs  Lab 06/04/2021 0406 06/28/2021 1116 06/21/2021 1701 06/23/21 0320  WBC 15.3*  --   --  13.7*  NEUTROABS 13.3*  --   --   --   HGB 10.6* 7.6* 8.2* 7.3*  HCT 35.2* 25.0* 26.5* 23.0*  MCV 88.0  --   --  86.8  PLT 462*  --   --  308     Basic Metabolic Panel: Recent Labs  Lab 06/26/2021 0406 07/04/2021 1701 06/23/21 0320  NA 133* 134* 131*  K 3.8 4.0 3.8  CL 90* 100 101  CO2 32 25 25  GLUCOSE 136* 198* 162*  BUN 26* 26* 33*  CREATININE 0.88 0.95 1.22  CALCIUM 9.4 7.7* 7.2*  MG  --   --  1.7    GFR: Estimated Creatinine Clearance: 50.2 mL/min (by C-G formula based on SCr of 1.22 mg/dL). Recent Labs  Lab 07/03/2021 0406 06/23/21 0320  WBC 15.3* 13.7*  LATICACIDVEN 1.1  --      Liver Function Tests: Recent Labs  Lab 06/05/2021 0406 06/23/21 0320  AST 10* 64*  ALT 7 38  ALKPHOS 88 59  BILITOT 0.7 1.1  PROT 6.7 4.5*  ALBUMIN 3.1* 2.2*    No results for input(s): LIPASE, AMYLASE in the last 168 hours. No results for input(s): AMMONIA in the last 168 hours.  ABG    Component Value Date/Time   PHART 7.358 06/17/2021 1130   PCO2ART 44.7 07/04/2021 1130   PO2ART 148 (H) 06/05/2021 1130   HCO3 24.5 06/24/2021 1130   ACIDBASEDEF 0.4 06/04/2021 1130   O2SAT 99.2 06/05/2021 1130      Coagulation Profile: No results for input(s): INR, PROTIME in the last 168 hours.  Cardiac Enzymes: No results for input(s): CKTOTAL, CKMB, CKMBINDEX, TROPONINI in  the last 168 hours.  HbA1C: Hgb A1c MFr Bld  Date/Time Value Ref Range Status  07/02/2021 05:01 PM 5.6 4.8 - 5.6 % Final    Comment:    (NOTE) Pre diabetes:          5.7%-6.4%  Diabetes:              >6.4%  Glycemic control for   <7.0% adults with diabetes     CBG: Recent Labs  Lab 06/21/2021 1746 06/12/2021 1925 06/24/2021 2333 06/23/21 0338 06/23/21 0738  GLUCAP 180* 171* 164* 146* 156*    Review of Systems:   Unable to complete as patient  is altered on mechanical ventilation   Past Medical History:  He,  has a past medical history of COPD (chronic obstructive pulmonary disease) ([place]).   Surgical History:  History reviewed. No pertinent surgical history.   Social History:   reports that he has been smoking cigarettes. He has a 15.00 pack-year smoking history. He has never used smokeless tobacco. He reports that he does not currently use drugs after having used the following drugs: Marijuana. He reports that he does not drink alcohol.   Family History:  His family history is not on file.   Allergies No Known Allergies   Home Medications  Prior to Admission medications   Medication Sig Start Date End Date Taking? Authorizing Provider  acetaminophen (TYLENOL) 500 MG tablet Take 500 mg by mouth every 6 (six) hours as needed for mild pain, fever or headache.   Yes [provider]  albuterol (VENTOLIN HFA) 108 (90 Base) MCG/ACT inhaler Inhale 1-2 puffs into the lungs every 6 (six) hours as needed for wheezing or shortness of breath.   Yes [provider]  alendronate (FOSAMAX) 70 MG tablet Take 70 mg by mouth once a week. Take with a full glass of water on an empty stomach.   Yes [provider]  amLODipine (NORVASC) 5 MG tablet Take 5 mg by mouth 2 (two) times daily.   Yes [provider]  aspirin EC 81 MG tablet Take 81 mg by mouth daily. Swallow whole.   Yes [provider]  atorvastatin (LIPITOR) 40 MG tablet Take 40 mg by  mouth daily.   Yes [provider]  Cholecalciferol (VITAMIN D3) 50 MCG (2000 UT) capsule Take 2,000 Units by mouth daily.   Yes [provider]  clopidogrel (PLAVIX) 75 MG tablet Take 75 mg by mouth daily.   Yes [provider]  docusate sodium (COLACE) 100 MG capsule Take 100 mg by mouth 2 (two) times daily as needed for mild constipation.   Yes [provider]  ferrous sulfate 325 (65 FE) MG tablet Take 325 mg by mouth 3 (three) times a week. Monday, Wednesday, Friday   Yes [provider]  lisinopril (ZESTRIL) 20 MG tablet Take 20 mg by mouth in the morning and at bedtime.   Yes [provider]  metoprolol tartrate (LOPRESSOR) 25 MG tablet Take 12.5 mg by mouth 2 (two) times daily.   Yes [provider]  Tiotropium Bromide-Olodaterol (STIOLTO RESPIMAT) 2.5-2.5 MCG/ACT AERS Inhale 1 puff into the lungs daily.   Yes [provider]  vitamin C (ASCORBIC ACID) 500 MG tablet Take 500 mg by mouth daily.   Yes [provider]  XTANDI 40 MG tablet Take 160 mg by mouth daily. 03/06/21  Yes [provider]     Critical care time:     CRITICAL CARE Performed by: Lanier Clam   Total critical care time: 32 minutes  Critical care time was exclusive of separately billable procedures and treating other patients.  Critical care was necessary to treat or prevent imminent or life-threatening deterioration.  Critical care was time spent personally by me on the following activities: development of treatment plan with patient and/or surrogate as well as nursing, discussions with consultants, evaluation of patient's response to treatment, examination of patient, obtaining history from patient or surrogate, ordering and performing treatments and interventions, ordering and review of laboratory studies, ordering and review of radiographic studies, pulse oximetry and re-evaluation of patient's condition.   Bonna Gains  Laneka Mcgrory,  MD See Amion for contact info

## 2021-06-24 ENCOUNTER — Encounter (HOSPITAL_COMMUNITY): Payer: Self-pay | Admitting: Surgery

## 2021-06-24 DIAGNOSIS — K55049 Acute infarction of large intestine, extent unspecified: Secondary | ICD-10-CM | POA: Diagnosis not present

## 2021-06-24 LAB — GLUCOSE, CAPILLARY
Glucose-Capillary: 109 mg/dL — ABNORMAL HIGH (ref 70–99)
Glucose-Capillary: 121 mg/dL — ABNORMAL HIGH (ref 70–99)
Glucose-Capillary: 121 mg/dL — ABNORMAL HIGH (ref 70–99)
Glucose-Capillary: 124 mg/dL — ABNORMAL HIGH (ref 70–99)
Glucose-Capillary: 129 mg/dL — ABNORMAL HIGH (ref 70–99)
Glucose-Capillary: 131 mg/dL — ABNORMAL HIGH (ref 70–99)
Glucose-Capillary: 133 mg/dL — ABNORMAL HIGH (ref 70–99)

## 2021-06-24 LAB — CBC
HCT: 24.8 % — ABNORMAL LOW (ref 39.0–52.0)
Hemoglobin: 7.9 g/dL — ABNORMAL LOW (ref 13.0–17.0)
MCH: 27.8 pg (ref 26.0–34.0)
MCHC: 31.9 g/dL (ref 30.0–36.0)
MCV: 87.3 fL (ref 80.0–100.0)
Platelets: 276 10*3/uL (ref 150–400)
RBC: 2.84 MIL/uL — ABNORMAL LOW (ref 4.22–5.81)
RDW: 16.5 % — ABNORMAL HIGH (ref 11.5–15.5)
WBC: 19.2 10*3/uL — ABNORMAL HIGH (ref 4.0–10.5)
nRBC: 0.2 % (ref 0.0–0.2)

## 2021-06-24 LAB — BASIC METABOLIC PANEL
Anion gap: 8 (ref 5–15)
BUN: 34 mg/dL — ABNORMAL HIGH (ref 8–23)
CO2: 25 mmol/L (ref 22–32)
Calcium: 7.5 mg/dL — ABNORMAL LOW (ref 8.9–10.3)
Chloride: 102 mmol/L (ref 98–111)
Creatinine, Ser: 1.28 mg/dL — ABNORMAL HIGH (ref 0.61–1.24)
GFR, Estimated: 58 mL/min — ABNORMAL LOW (ref >60)
Glucose, Bld: 137 mg/dL — ABNORMAL HIGH (ref 70–99)
Potassium: 3.6 mmol/L (ref 3.5–5.1)
Sodium: 135 mmol/L (ref 135–145)

## 2021-06-24 MED ORDER — SODIUM CHLORIDE 0.9% FLUSH
10.0000 mL | Freq: Two times a day (BID) | INTRAVENOUS | Status: DC
  Administered 2021-06-24 – 2021-07-02 (×18): 10 mL

## 2021-06-24 MED ORDER — SODIUM CHLORIDE 0.9% FLUSH
10.0000 mL | INTRAVENOUS | Status: DC | PRN
  Administered 2021-06-24: 10 mL

## 2021-06-24 MED ORDER — FUROSEMIDE 10 MG/ML IJ SOLN
80.0000 mg | Freq: Once | INTRAMUSCULAR | Status: AC
  Administered 2021-06-24: 80 mg via INTRAVENOUS
  Filled 2021-06-24: qty 8

## 2021-06-24 MED ORDER — LACTATED RINGERS IV BOLUS: 1000.0000 mL | Freq: Once | INTRAVENOUS | Status: DC

## 2021-06-24 MED ORDER — HEPARIN SODIUM (PORCINE) 5000 UNIT/ML IJ SOLN
5000.0000 [IU] | Freq: Three times a day (TID) | INTRAMUSCULAR | Status: DC
  Administered 2021-06-24 – 2021-07-10 (×49): 5000 [IU] via SUBCUTANEOUS
  Filled 2021-06-24 (×44): qty 1

## 2021-06-24 NOTE — Significant Event (Signed)
Patient failed SBT due to mild desaturation but mainly due to development of tachypnea and significant tachycardia to the 150s.  Placed back on PRVC.  Pressors weaned throughout the afternoon.  He has tight pitting edema.  Suspect element of volume overload contributing to inability to wean.  Urine output slowly improving.  Now that pressors have been reduced, Lasix 80 mg IV x1 ordered to help diurese off additional fluid.

## 2021-06-24 NOTE — Consult Note (Signed)
NAME:  Daniel King, MRN:  [id number], DOB:  [birth year], LOS: 2 ADMISSION DATE:  06/16/2021, CONSULTATION DATE:  8/19 REFERRING MD:  Dr. Johney Maine, CHIEF COMPLAINT:  Post-operative respiratory failure    History of Present Illness:  75 y/o M who presented to [facility] on 8/19 with reports of 24 hours of abdominal pain that worsened the am of presentation.   He reported his last BM on 8/18, prior to that was one week ago.  On presentation he was note to have abdominal distention, tenderness.  CT of the abdomen was assessed with concerns for primary colon cancer in the mid sigmoid colon complicated by colonic obstruction with pneumatosis involving the cecum and ascending colon, consistent with ischemic colitis. Evidence of bowel perforation with small volume pneumoperitoneum.  He was evaluated by CCS and recommended for open colectomy / ileostomy.  He was on plavix prior to admit.  He was started on IV zosyn.  Intraoperative findings notable for massively dilated colon, cecum and ascending colon with ischemia and patches of phlegmon & gangrene with approximately 3 feet of colon removed, small volume pneumoperitoneum, rocky hard rectosigmoid mass worrisome for malignancy.  EBL estimated at 171m.  He had intraoperative hypotension requiring vasopressors.  The patient was returned to the ICU post surgery on mechanical ventilation. Post op Hgb 7.6.  The patient was given 1 unit PRBC.    PCCM consulted for assistance with ICU care.    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 Returned to ICU on vent.   Interim History / Subjective:  NAEON. UOP a bit better, still relatively oliguric. Cr  stable. Alert, on SBT. Anticipate extubation.  Objective   Blood pressure (!) 141/48, pulse (!) 57, temperature 97.9 F (36.6 C), resp. rate 14, height 6' (1.829 m), weight 66.4 kg, SpO2 98 %. CVP:  [7 mmHg-12 mmHg] 8 mmHg  Vent Mode: PRVC FiO2 (%):  [30 %-40 %] 30 % Set Rate:  [14 bmp] 14 bmp Vt Set:  [620 mL] 620 mL PEEP:  [5 cmH20] 5 cmH20 Plateau Pressure:  [13 cmH20-20 cmH20] 14 cmH20   Intake/Output Summary (Last 24 hours) at 06/24/2021 0853 Last data filed at 06/24/2021 [id number] per 24 hour  Intake 3626.85 ml  Output 948 ml  Net 2678.85 ml    Filed Weights   06/21/2021 0356 06/23/21 0456 06/24/21 0500  Weight: 68 kg 67.9 kg 66.4 kg    Examination: General: ill appearing adult male lying in bed in NAD on vent HENT: ETT, PERRL anicteric Lungs: non-labored on vent, lungs bilaterally distant but clear, barrel chest noted  Cardiovascular: s1s2 rrr, no m/r/g Abdomen: midline VAC in place, right colostomy with pink stoma, bloody drainage in ostomy bag Extremities: warm/dry, no edema Neuro: sedate    Resolved Hospital Problem list     Assessment & Plan:   Acute Bowel Perforation s/p Open Colectomy with Ileostomy Concern for malignancy based on intraoperative findings.  -post operative and wound care per CCS  -follow up pathology  -will need WOC assistance for teaching once able to perform self care of ostomy   Shock: Septic, hypovolemia, blood loss anemia. Hgb continues to drift down. --zosyn given intraabdominal source --  MAP > 65, NE --Trend Hgb, sp 1u PRBC 8/19  Acute Kidney Injury: UOP on the low side, Cr rising, but stabke. Presumed ATN in setting of shock. --NE MAP > 65  Acute Hypoxic Respiratory Failure  COPD without AE -PRVC 8cc/kg  -wean PEEP / fiO2 for sats >90% -SBT, some desaturations, tentative plan for extubation -brovana + pulmicort BID   Sedation Needs while on Mechanical Ventilation  -RASS Goal -1 to -2 -PAD protocol with fentanyl, precedex    HTN, HLD -hold home metoprolol, norvasc, lipitor, lisinopril, lopressor   Hx CVA -hold home plavix, ASA   At Risk Malnutrition  -defer timing of enteral feeding to CCS   Hx Prostate Cancer  On Xtandi at home  -hold home meds  Best Practice (right click and "Reselect all SmartList Selections" daily)  Diet/type: NPO DVT prophylaxis: SCD GI prophylaxis: PPI Lines: Central line Foley:  Yes, and it is still needed Code Status:  full code Last date of multidisciplinary goals of care discussion - family updated daily  Labs   CBC: Recent Labs  Lab 06/25/2021 0406 06/14/2021 1116 07/01/2021 1701 06/23/21 0320 06/23/21 1422 06/24/21 0601  WBC 15.3*  --   --  13.7*  --  19.2*  NEUTROABS 13.3*  --   --   --   --   --   HGB 10.6* 7.6* 8.2* 7.3* 6.7* 7.9*  HCT 35.2* 25.0* 26.5* 23.0*  --  24.8*  MCV 88.0  --   --  86.8  --  87.3  PLT 462*  --   --  308  --  276     Basic Metabolic Panel: Recent Labs  Lab 06/06/2021 0406 06/11/2021 1701 06/23/21 0320 06/24/21 0744  NA 133* 134* 131* 135  K 3.8 4.0 3.8 3.6  CL 90* 100 101 102  CO2 32 '25 25 25  '$ GLUCOSE 136* 198* 162* 137*  BUN 26* 26* 33* 34*  CREATININE 0.88 0.95 1.22 1.28*  CALCIUM 9.4 7.7* 7.2* 7.5*  MG  --   --  1.7  --     GFR: Estimated Creatinine Clearance: 46.8 mL/min (A) (by C-G formula based on SCr of 1.28 mg/dL (H)). Recent Labs  Lab 06/09/2021 0406 06/23/21 0320 06/24/21 0601  WBC 15.3* 13.7* 19.2*  LATICACIDVEN 1.1  --   --      Liver Function Tests: Recent Labs  Lab 06/29/2021 0406 06/23/21 0320  AST 10* 64*  ALT 7 38  ALKPHOS 88 59  BILITOT 0.7 1.1  PROT 6.7 4.5*  ALBUMIN 3.1* 2.2*    No results for input(s): LIPASE, AMYLASE in the last 168 hours. No results for input(s): AMMONIA in the last 168 hours.  ABG    Component Value Date/Time   PHART 7.358 06/13/2021 1130   PCO2ART 44.7 06/04/2021 1130   PO2ART 148 (H) 06/30/2021 1130   HCO3 24.5 06/25/2021 1130   ACIDBASEDEF 0.4 06/15/2021  1130   O2SAT 99.2 06/11/2021 1130      Coagulation Profile: No results for input(s): INR, PROTIME in the last 168 hours.  Cardiac Enzymes: No results for input(s): CKTOTAL, CKMB, CKMBINDEX, TROPONINI in the last 168 hours.  HbA1C: Hgb A1c MFr Bld  Date/Time Value Ref Range Status  06/09/2021 05:01 PM 5.6 4.8 - 5.6 % Final    Comment:    (NOTE) Pre diabetes:          5.7%-6.4%  Diabetes:              >6.4%  Glycemic control for   <7.0% adults with diabetes     CBG: Recent Labs  Lab 06/23/21 1623 06/23/21 1932 06/24/21 0027 06/24/21 0426 06/24/21 0821  GLUCAP 153* 137* 131* 133* 124*     Review of Systems:   Unable to complete as patient is altered on mechanical ventilation   Past Medical History:  He,  has a past medical history of COPD (chronic obstructive pulmonary disease) ([place]).   Surgical History:  History reviewed. No pertinent surgical history.   Social History:   reports that he has been smoking cigarettes. He has a 15.00 pack-year smoking history. He has never used smokeless tobacco. He reports that he does not currently use drugs after having used the following drugs: Marijuana. He reports that he does not drink alcohol.   Family History:  His family history is not on file.   Allergies No Known Allergies   Home Medications  Prior to Admission medications   Medication Sig Start Date End Date Taking? Authorizing Provider  acetaminophen (TYLENOL) 500 MG tablet Take 500 mg by mouth every 6 (six) hours as needed for mild pain, fever or headache.   Yes [provider]  albuterol (VENTOLIN HFA) 108 (90 Base) MCG/ACT inhaler Inhale 1-2 puffs into the lungs every 6 (six) hours as needed for wheezing or shortness of breath.   Yes [provider]  alendronate (FOSAMAX) 70 MG tablet Take 70 mg by mouth once a week. Take with a full glass of water on an empty stomach.   Yes [provider]  amLODipine (NORVASC) 5 MG tablet Take 5 mg  by mouth 2 (two) times daily.   Yes [provider]  aspirin EC 81 MG tablet Take 81 mg by mouth daily. Swallow whole.   Yes [provider]  atorvastatin (LIPITOR) 40 MG tablet Take 40 mg by mouth daily.   Yes [provider]  Cholecalciferol (VITAMIN D3) 50 MCG (2000 UT) capsule Take 2,000 Units by mouth daily.   Yes [provider]  clopidogrel (PLAVIX) 75 MG tablet Take 75 mg by mouth daily.   Yes [provider]  docusate sodium (COLACE) 100 MG capsule Take 100 mg by mouth 2 (two) times daily as needed for mild constipation.   Yes [provider]  ferrous sulfate 325 (65 FE) MG tablet Take 325 mg by mouth 3 (three) times a week. Monday, Wednesday, Friday   Yes [provider]  lisinopril (ZESTRIL) 20 MG tablet Take 20 mg by mouth in the morning and at bedtime.   Yes [provider]  metoprolol tartrate (LOPRESSOR) 25 MG tablet Take 12.5 mg by mouth 2 (two) times daily.   Yes [provider]  Tiotropium Bromide-Olodaterol (STIOLTO RESPIMAT) 2.5-2.5 MCG/ACT AERS Inhale 1 puff into the lungs daily.   Yes [provider]  vitamin C (ASCORBIC ACID) 500 MG tablet Take 500 mg by mouth daily.   Yes [provider]  XTANDI 40 MG tablet Take 160 mg by mouth daily. 03/06/21  Yes [provider]     Critical care time:     CRITICAL CARE Performed by: Lanier Clam   Total critical care time: 31 minutes  Critical care time was exclusive of separately billable procedures and treating other patients.  Critical care was necessary to treat or prevent imminent or life-threatening deterioration.  Critical care was time spent personally by me on the following activities: development of treatment plan with patient and/or surrogate as well as nursing,  discussions with consultants, evaluation of patient's response to treatment, examination of patient, obtaining history from patient or surrogate,  ordering and performing treatments and interventions, ordering and review of laboratory studies, ordering and review of radiographic studies, pulse oximetry and re-evaluation of patient's condition.   Lanier Clam, MD See Shea Evans for contact info

## 2021-06-24 NOTE — Progress Notes (Signed)
Noted weaning Levo gtt. Currently gtt is infusing delivering 80mq/kg/min.VSS

## 2021-06-24 NOTE — Progress Notes (Signed)
Subjective Appropriate response to transfusion. Remains intubated but awake and nodding appropriately  Objective: Vital signs in last 24 hours: Temp:  [97.2 F (36.2 C)-98.1 F (36.7 C)] 97.9 F (36.6 C) (08/21 0500) Pulse Rate:  [57-128] 57 (08/21 0500) Resp:  [12-26] 14 (08/21 0500) BP: (111-169)/(36-123) 141/48 (08/21 0500) SpO2:  [96 %-100 %] 98 % (08/21 0755) FiO2 (%):  [30 %-40 %] 30 % (08/21 0755) Weight:  [66.4 kg] 66.4 kg (08/21 0500) Last BM Date: 06/23/21  Intake/Output from previous day: 08/20 0701 - 08/21 0700 In: 3626.9 [I.V.:2978.1; Blood:513.3; IV Piggyback:135.4] Out: 948 [Urine:453; Emesis/NG output:250; Stool:245] Intake/Output this shift: No intake/output data recorded.  Gen: Intubated, awake, alert, answering questions with yes/no. CV: RRR Pulm: Normal work of breathing Abd: Soft, not significantly distended; ileostomy pink with sweat in appliance. VAC in place with thin ss effluent Ext: SCDs in place  Lab Results: CBC  Recent Labs    06/23/21 0320 06/23/21 1422 06/24/21 0601  WBC 13.7*  --  19.2*  HGB 7.3* 6.7* 7.9*  HCT 23.0*  --  24.8*  PLT 308  --  276   BMET Recent Labs    06/14/2021 1701 06/23/21 0320  NA 134* 131*  K 4.0 3.8  CL 100 101  CO2 25 25  GLUCOSE 198* 162*  BUN 26* 33*  CREATININE 0.95 1.22  CALCIUM 7.7* 7.2*   PT/INR No results for input(s): LABPROT, INR in the last 72 hours. ABG Recent Labs    06/11/2021 1130  PHART 7.358  HCO3 24.5    Studies/Results:  Anti-infectives: Anti-infectives (From admission, onward)    Start     Dose/Rate Route Frequency Ordered Stop   06/15/2021 1400  piperacillin-tazobactam (ZOSYN) IVPB 3.375 g        3.375 g 12.5 mL/hr over 240 Minutes Intravenous Every 8 hours 06/29/2021 1357 06/27/21 1414   06/09/2021 0745  cefoTEtan (CEFOTAN) 2 g in sodium chloride 0.9 % 100 mL IVPB        2 g 200 mL/hr over 30 Minutes Intravenous On call to O.R. 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 -Awaiting return of bowel fxn; will need WOCN to see for new stoma teaching -Oliguria; Cr 1.22 yesterday; no bmp yet today. Cont MIVF 125cc/hr; 1L bolus. Anticipate 3rd space losses and volume shifts with having had large amount of colon removed (total abdominal colectomy), laparotomy. Bolus 1L LR ordered -Ppx: SCDs; PPI; ordered SQH to  start today -Wife present at bedside this morning and updated again today, questions were answered   LOS: 2 days   Nadeen Landau, MD [facility] Use [email or web address] to contact on call provider

## 2021-06-24 NOTE — Progress Notes (Signed)
Patient was taken out of wean mode due to becoming anxious and tachycardic. Patient is tolerating being placed back into initial settings 620 14 +5 40%. RT will continue to monitor

## 2021-06-24 NOTE — Plan of Care (Signed)
PLAN OF CARE RTEVIEWED

## 2021-06-25 DIAGNOSIS — J9600 Acute respiratory failure, unspecified whether with hypoxia or hypercapnia: Secondary | ICD-10-CM

## 2021-06-25 DIAGNOSIS — K55049 Acute infarction of large intestine, extent unspecified: Secondary | ICD-10-CM | POA: Diagnosis not present

## 2021-06-25 LAB — TYPE AND SCREEN
ABO/RH(D): A POS
Antibody Screen: NEGATIVE
Unit division: 0
Unit division: 0

## 2021-06-25 LAB — BASIC METABOLIC PANEL
Anion gap: 8 (ref 5–15)
Anion gap: 9 (ref 5–15)
BUN: 26 mg/dL — ABNORMAL HIGH (ref 8–23)
BUN: 20 mg/dL (ref 8–23)
CO2: 28 mmol/L (ref 22–32)
CO2: 30 mmol/L (ref 22–32)
Calcium: 7.3 mg/dL — ABNORMAL LOW (ref 8.9–10.3)
Calcium: 7.3 mg/dL — ABNORMAL LOW (ref 8.9–10.3)
Chloride: 99 mmol/L (ref 98–111)
Chloride: 98 mmol/L (ref 98–111)
Creatinine, Ser: 0.97 mg/dL (ref 0.61–1.24)
Creatinine, Ser: 0.88 mg/dL (ref 0.61–1.24)
GFR, Estimated: >60 mL/min (ref >60)
GFR, Estimated: >60 mL/min (ref >60)
Glucose, Bld: 131 mg/dL — ABNORMAL HIGH (ref 70–99)
Glucose, Bld: 120 mg/dL — ABNORMAL HIGH (ref 70–99)
Potassium: 3 mmol/L — ABNORMAL LOW (ref 3.5–5.1)
Potassium: 2.8 mmol/L — ABNORMAL LOW (ref 3.5–5.1)
Sodium: 135 mmol/L (ref 135–145)
Sodium: 137 mmol/L (ref 135–145)

## 2021-06-25 LAB — CBC
HCT: 25 % — ABNORMAL LOW (ref 39.0–52.0)
Hemoglobin: 8 g/dL — ABNORMAL LOW (ref 13.0–17.0)
MCH: 28 pg (ref 26.0–34.0)
MCHC: 32 g/dL (ref 30.0–36.0)
MCV: 87.4 fL (ref 80.0–100.0)
Platelets: 231 10*3/uL (ref 150–400)
RBC: 2.86 MIL/uL — ABNORMAL LOW (ref 4.22–5.81)
RDW: 17 % — ABNORMAL HIGH (ref 11.5–15.5)
WBC: 11.9 10*3/uL — ABNORMAL HIGH (ref 4.0–10.5)
nRBC: 0.2 % (ref 0.0–0.2)

## 2021-06-25 LAB — BPAM RBC
Blood Product Expiration Date: 202209092359
Blood Product Expiration Date: 202209172359
ISSUE DATE / TIME: 202208191346
ISSUE DATE / TIME: 202208201827
Unit Type and Rh: 6200
Unit Type and Rh: 6200

## 2021-06-25 LAB — MAGNESIUM: Magnesium: 1.9 mg/dL (ref 1.7–2.4)

## 2021-06-25 LAB — GLUCOSE, CAPILLARY
Glucose-Capillary: 101 mg/dL — ABNORMAL HIGH (ref 70–99)
Glucose-Capillary: 102 mg/dL — ABNORMAL HIGH (ref 70–99)
Glucose-Capillary: 102 mg/dL — ABNORMAL HIGH (ref 70–99)
Glucose-Capillary: 108 mg/dL — ABNORMAL HIGH (ref 70–99)
Glucose-Capillary: 130 mg/dL — ABNORMAL HIGH (ref 70–99)
Glucose-Capillary: 137 mg/dL — ABNORMAL HIGH (ref 70–99)

## 2021-06-25 MED ORDER — DILTIAZEM HCL-DEXTROSE 125-5 MG/125ML-% IV SOLN (PREMIX)
5.0000 mg/h | INTRAVENOUS | Status: DC
  Administered 2021-06-25: 5 mg/h via INTRAVENOUS
  Filled 2021-06-25: qty 125

## 2021-06-25 MED ORDER — POTASSIUM CHLORIDE 10 MEQ/50ML IV SOLN: 10.0000 meq | INTRAVENOUS | Status: AC

## 2021-06-25 MED ORDER — AMIODARONE HCL IN DEXTROSE 360-4.14 MG/200ML-% IV SOLN
30.0000 mg/h | INTRAVENOUS | Status: DC
  Administered 2021-06-26 (×2): 30 mg/h via INTRAVENOUS
  Filled 2021-06-25: qty 200

## 2021-06-25 MED ORDER — LACTATED RINGERS IV BOLUS
500.0000 mL | Freq: Once | INTRAVENOUS | Status: AC
  Administered 2021-06-25: 500 mL via INTRAVENOUS

## 2021-06-25 MED ORDER — DEXTROSE IN LACTATED RINGERS 5 % IV SOLN: INTRAVENOUS | Status: DC

## 2021-06-25 MED ORDER — POTASSIUM CHLORIDE 10 MEQ/50ML IV SOLN
10.0000 meq | INTRAVENOUS | Status: AC
  Administered 2021-06-25 (×5): 10 meq via INTRAVENOUS
  Filled 2021-06-25 (×5): qty 50

## 2021-06-25 MED ORDER — MAGNESIUM SULFATE 2 GM/50ML IV SOLN
2.0000 g | Freq: Once | INTRAVENOUS | Status: AC
  Administered 2021-06-25: 2 g via INTRAVENOUS
  Filled 2021-06-25: qty 50

## 2021-06-25 MED ORDER — POTASSIUM CHLORIDE 10 MEQ/50ML IV SOLN
10.0000 meq | INTRAVENOUS | Status: AC
Start: 2021-06-25 — End: 2021-06-26
  Administered 2021-06-25 – 2021-06-26 (×8): 10 meq via INTRAVENOUS
  Filled 2021-06-25 (×8): qty 50

## 2021-06-25 MED ORDER — FUROSEMIDE 10 MG/ML IJ SOLN
80.0000 mg | Freq: Once | INTRAMUSCULAR | Status: AC
  Administered 2021-06-25: 80 mg via INTRAVENOUS
  Filled 2021-06-25: qty 8

## 2021-06-25 MED ORDER — NOREPINEPHRINE 4 MG/250ML-% IV SOLN
0.0000 ug/min | INTRAVENOUS | Status: DC
  Administered 2021-06-25: 2 ug/min via INTRAVENOUS

## 2021-06-25 MED ORDER — AMIODARONE LOAD VIA INFUSION
150.0000 mg | Freq: Once | INTRAVENOUS | Status: AC
  Administered 2021-06-25: 150 mg via INTRAVENOUS
  Filled 2021-06-25: qty 83.34

## 2021-06-25 MED ORDER — AMIODARONE HCL IN DEXTROSE 360-4.14 MG/200ML-% IV SOLN
60.0000 mg/h | INTRAVENOUS | Status: AC
  Administered 2021-06-25 (×2): 60 mg/h via INTRAVENOUS
  Filled 2021-06-25 (×2): qty 200

## 2021-06-25 MED ORDER — MIDAZOLAM HCL 2 MG/2ML IJ SOLN: 1.0000 mg | INTRAMUSCULAR | Status: DC | PRN

## 2021-06-25 MED ORDER — FENTANYL CITRATE (PF) 100 MCG/2ML IJ SOLN: 25.0000 ug | INTRAMUSCULAR | Status: DC | PRN

## 2021-06-25 NOTE — Progress Notes (Addendum)
   Improved aWake and oriented Indicating desire for extubation On some levophed 35mg and precedex  Plan Dc sedation prn Extubate to BiPAP and then bipap QHS (has advanced copd) Wean off pressors/precedex as tolerated     SIGNATURE    Dr. MBrand Males M.D., F.C.C.P,  Pulmonary and Critical Care Medicine Staff Physician, CAskewvilleDirector - Interstitial Lung Disease  Program  [place] [place] [id number] NPI Number:  NPI #[id number] Pager: [phone] If no answer  -> Check AMION or Try [phone] Telephone (clinical office): [phone] Telephone (research): [phone]  1:58 PM 06/25/2021

## 2021-06-25 NOTE — Progress Notes (Signed)
  Subjective No acute changes, did not tolerate vent wean  Objective: Vital signs in last 24 hours: Temp:  [97.8 F (36.6 C)-98.2 F (36.8 C)] 98.1 F (36.7 C) (08/22 0400) Pulse Rate:  [45-111] 45 (08/22 0630) Resp:  [11-27] 14 (08/22 0630) BP: (75-175)/(26-111) 166/63 (08/22 0630) SpO2:  [94 %-100 %] 100 % (08/22 0755) FiO2 (%):  [40 %] 40 % (08/22 0755) Last BM Date: 06/24/21  Intake/Output from previous day: 08/21 0701 - 08/22 0700 In: 1365 [I.V.:1128.6; IV Piggyback:236.5] Out: 2949 [Urine:2549; Emesis/NG output:300; Stool:100] Intake/Output this shift: No intake/output data recorded.  Gen: Intubated, awake, alert, answering questions with yes/no. CV: RRR Pulm: Normal work of breathing Abd: Soft, nondistended, appears appropriately tender. Midline wound appears healthy with early granulation. Stoma viable and edematous with ostomy sweat but no real stool output yet Ext: SCDs in place  Lab Results: CBC  Recent Labs    06/24/21 0601 06/25/21 0325  WBC 19.2* 11.9*  HGB 7.9* 8.0*  HCT 24.8* 25.0*  PLT 276 231   BMET Recent Labs    06/24/21 0744 06/25/21 0325  NA 135 135  K 3.6 3.0*  CL 102 99  CO2 25 28  GLUCOSE 137* 131*  BUN 34* 26*  CREATININE 1.28* 0.97  CALCIUM 7.5* 7.3*   PT/INR No results for input(s): LABPROT, INR in the last 72 hours. ABG Recent Labs    06/11/2021 1130  PHART 7.358  HCO3 24.5    Studies/Results:  Anti-infectives: Anti-infectives (From admission, onward)    Start     Dose/Rate Route Frequency Ordered Stop   06/13/2021 1400  piperacillin-tazobactam (ZOSYN) IVPB 3.375 g        3.375 g 12.5 mL/hr over 240 Minutes Intravenous Every 8 hours 06/27/2021 1357 06/27/21 1414   07/03/2021 0745  cefoTEtan (CEFOTAN) 2 g in sodium chloride 0.9 % 100 mL IVPB        2 g 200 mL/hr over 30 Minutes Intravenous On call to O.R. 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 -Awaiting return of bowel fxn; appreciate assistance from WOCN -Oliguria; improving, creatinine normalized; Cont MIVF,  potassium being replaced, Anticipate 3rd space losses and volume shifts with having had large amount of colon removed (total abdominal colectomy), laparotomy.  -Ppx: SCDs; PPI; ordered SQH to start today -Wife present at bedside    LOS: 3 days   [place] [facility] Use [email or web address] to contact on call provider

## 2021-06-25 NOTE — Progress Notes (Addendum)
Daniel King DOB: 03-27-1946   Pt has continued to run in and out of afib hr 80-170, and ST throughout the shift. MD was notified this morning / Patient was extubated this afternoon. Tried Cardizem drip and bp's began to drop. Cardizem turned off. LR 500 ml bolus given. Pts heart rate and bp's remain up and down constantly. MD wants cardiology consult. Current vitals hr 150, 02 99, rr 21, and bp 110/65. MD aware

## 2021-06-25 NOTE — Progress Notes (Addendum)
[place] Progress Note Patient Name: Daniel King DOB: 06-04-1946 MRN: [id number]   Date of Service  06/25/2021  HPI/Events of Note  K+ 2.8 crt , co2 at 30. Has Central line. Mag at 1.9 at noon.   Camera: Was refusing to go on BiPAP. Convinced to go on it with help of bed side RN. Agrees.   eICU Interventions  - IV Kcl ordered.Mag level in am. - going on BIPAP.     Intervention Category Intermediate Interventions: Electrolyte abnormality - evaluation and management  Elmer Sow 06/25/2021, 9:27 PM

## 2021-06-25 NOTE — Progress Notes (Signed)
PCCM Interval Progress Note  Pt with AF RVR for past few hours with rates in 1402 to 160s.  Cardizem was tried earlier but BP dropped; therefore, stopped.  Remains with AFRVR.  AM labs noteable for K 3.0, Mg 1.9.  S/p 2g Mag and 6 runs K. No repeat labs yet.   Repeat BMP STAT. Will order amio bolus and infusion. Hold on systemic AC for now given some bloody output from ostomy earlier per CCS and RN.   Montey Hora, [place] [facility] For pager details, please see AMION or use Epic chat  After 1900, please call [facility] for cross coverage needs 06/25/2021, 6:15 PM

## 2021-06-25 NOTE — Progress Notes (Signed)
K+ 3.0, Mg 1.9 Replaced per protocol  

## 2021-06-25 NOTE — Progress Notes (Signed)
SLP Cancellation Note  Patient Details Name: Daniel King MRN: [id number] DOB: 15-Nov-1945   Cancelled treatment:       Reason Eval/Treat Not Completed: Medical issues which prohibited therapy;Other (comment) (Order received for BSE; patient ramins intubated but with plans to extubate this afternoon to BiPAP. SLP will f/u with patient next date for readiness for swallow assessment.)  Sonia Baller, MA, CCC-SLP Speech Therapy

## 2021-06-25 NOTE — Consult Note (Signed)
NAME:  Daniel King, MRN:  [id number], DOB:  [birth year], LOS: 3 ADMISSION DATE:  06/24/2021, CONSULTATION DATE:  8/19 REFERRING MD:  Dr. Johney Maine, CHIEF COMPLAINT:  Post-operative respiratory failure    History of Present Illness:  75 y/o M who presented to [facility] on 8/19 with reports of 24 hours of abdominal pain that worsened the am of presentation.   He reported his last BM on 8/18, prior to that was one week ago.  On presentation he was note to have abdominal distention, tenderness.  CT of the abdomen was assessed with concerns for primary colon cancer in the mid sigmoid colon complicated by colonic obstruction with pneumatosis involving the cecum and ascending colon, consistent with ischemic colitis. Evidence of bowel perforation with small volume pneumoperitoneum.  He was evaluated by CCS and recommended for open colectomy / ileostomy.  He was on plavix prior to admit.  He was started on IV zosyn.  Intraoperative findings notable for massively dilated colon, cecum and ascending colon with ischemia and patches of phlegmon & gangrene with approximately 3 feet of colon removed, small volume pneumoperitoneum, rocky hard rectosigmoid mass worrisome for malignancy.  EBL estimated at 182m.  He had intraoperative hypotension requiring vasopressors.  The patient was returned to the ICU post surgery on mechanical ventilation. Post op Hgb 7.6.  The patient was given 1 unit PRBC.    PCCM consulted for assistance with ICU care.    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 Returned to ICU on vent.  MRSA PCR positive COVID PCR negative Flu PCR negative Left IJ 8/19  Foley 8/19 >  8/22 ETT 8/19  06/24/21 - NAEON. UOP a bit better, still relatively oliguric. Cr stable. Alert, on SBT. Anticipate extubation.  Interim History / Subjective:   06/25/21 : Failed SBT yesterday.  Lasix given.  Yesterday afebrile.  White count down to 11 point 9K.  Remains on ventilator 40% FiO2.  On fentanyl infusion, Precedex infusion, Levophed infusion off.  Only on vasopressin infusion.  Getting magnesium sulfate and potassium.  Remains on Zosyn.  Surgery noted that his oliguria is improving and creatinine normalized and to anticipate third space losses given colectomy  Objective   Blood pressure (!) 151/82, pulse (!) 49, temperature 98.1 F (36.7 C), temperature source Axillary, resp. rate (!) 21, height 6' (1.829 m), weight 66.4 kg, SpO2 97 %.    Vent Mode: PSV;CPAP FiO2 (%):  [40 %] 40 % Set Rate:  [14 bmp] 14 bmp Vt Set:  [620 mL] 620 mL PEEP:  [5 cmH20] 5 cmH20 Pressure Support:  [5 cmH20] 5 cmH20 Plateau Pressure:  [12 cmH20-18 cmH20] 14 cmH20   Intake/Output Summary (Last 24 hours) at 06/25/2021 0923 Last data filed at 06/25/2021 0600 Gross per 24 hour  Intake 1105.23 ml  Output 2949 ml  Net -1843.77 ml   Filed Weights   06/12/2021 0356 06/23/21 0456 06/24/21 0500  Weight: 68 kg 67.9 kg 66.4 kg    Examination: General Appearance:  Looks criticall ill . LOOKS CACHECTIC Head:  Normocephalic, without obvious abnormality, atraumatic Eyes:  PERRL - yes, conjunctiva/corneas - muddy     Ears:  Normal external  ear canals, both ears Nose:  G tube - yes right nostril Throat:  ETT TUBE - yes , OG tube - no Neck:  Supple,  No enlargement/tenderness/nodules Lungs: Clear to auscultation bilaterally, Ventilator   Synchrony - yes 40% Heart:  S1 and S2 normal, no murmur, CVP - x.  Pressors - vasopressin curently Abdomen:  Soft, no masses, no organomegaly Genitalia / Rectal:  Not done Extremities:  Extremities- intact Skin:  ntact in exposed areas . Sacral area - not  examined Neurologic:  Sedation - fent gtt -> RASS - -3 on fent gtt, precedex gtt . Moves all 4s - earlier yes when agitated. CAM-ICU - not able to test . Orientation - no       Resolved Hospital Problem list     Assessment & Plan:   Acute Bowel Perforation and Pneumoatosis s/p Open Colectomy with Ileostomy - present on admission and chief admission problem 06/29/2021 - concner for malignancy at admit (colonic obstruction likely secondary to sigmoid carcinoma.  Right-sided colon appears to be ischemic and there is perforation in the right upper quadrant)  06/25/2021 - dressing changed by [place] and CCS . Some mild bleeding via colosotomy  Plan -post operative and wound care per CCS  -follow up pathology  06/04/21 -will need WOC assistance for teaching once able to perform self care of ostomy   Current every day smoker - 60ppd hx - prresent on admit COPD - chronic hyppoxemic resp failure - 2L baseline without AE  - baseline prior to and present on admit Acute Hypoxic Respiratory Failure  due to post op - present at admission   06/25/2021 - 06/25/2021 - > does YES meet criteria for SBT in setting of Acute Respiratory Failure due to p-lpa. But needs sedation wean first  PLan -PRVC 8cc/kg  -wean PEEP / fiO2 for sats >90% -SBT 06/25/2021 -assess for extuibation -brovana + pulmicort BID  - high risk for trach/ltact given copd o2 dependence  Sedation Needs while on Mechanical Ventilation - present on admi  06/25/2021 - on fent gtt and precedex gtt. Sometimes gets agitated  Plan -RASS Goal -1 to -2 -dc fent gtt  - start fent prn  - start versed prn -PAD protocol with fentanyl, precedex      Shock: Septic, hypovolemia, blood loss anemia.  06/25/2021 - off levophed but on vasopressson  Plan  - levophed for MAP goal> 65 - vasopressin if levophed needs are > 32mg (so make swtich) --zosyn given intraabdominal source --MAP > 65, NE   Acute Kidney Injury:  Presumed ATN in setting of  shock.  06/25/2021- improving  Plan --NE MAP > 65  Anemia of blood loss and critical illness - presnte on admit 10.6gm% -  sp 1u PRBC 8/19  06/25/2021 - mild oozing via colostomy +  Plan  - - PRBC for hgb </= 6.9gm%    - exceptions are   -  if ACS susepcted/confirmed then transfuse for hgb </= 8.0gm%,  or    -  active bleeding with hemodynamic instability, then transfuse regardless of hemoglobin value   At at all times try to transfuse 1 unit prbc as possible with exception of active hemorrhage  Hypokalemia - moderate 06/25/21 Hypomagnesemia - mild 06/25/21  Plan  - replete  HTN, HLD -hold home metoprolol, norvasc, lipitor, lisinopril, lopressor   Hx CVA - on DAPT prior to admit  Plan -hold home plavix, ASA   At least moderate protein calorie Malnutrition @ admit 8/19 (alb 3, cachexia  with copd, decreased po intake leading into admit)  Plan  -  -defer timing of enteral feeding to CCS   Hx Prostate Cancer  On Xtandi at home  -hold home meds - check PSA  Best Practice (right click and "Reselect all SmartList Selections" daily)  Diet/type: NPO DVT prophylaxis: SCD GI prophylaxis: PPI Lines: Central line - L IJ 8/19  Foley:  Placed8/19 - remove 8/22 Code Status:  full code  Last date of multidisciplinary goals of care discussion - by CCS for 06/25/21     ATTESTATION & SIGNATURE   The patient Jonethan Veltre is critically ill with multiple organ systems failure and requires high complexity decision making for assessment and support, frequent evaluation and titration of therapies, application of advanced monitoring technologies and extensive interpretation of multiple databases.   Critical Care Time devoted to patient care services described in this note is  35  Minutes. This time reflects time of care of this signee Dr Brand Males. This critical care time does not reflect procedure time, or teaching time or supervisory time of PA/NP/Med student/Med Resident etc  but could involve care discussion time     Dr. Brand Males, M.D., [facility].C.P Pulmonary and Critical Care Medicine Staff Physician [place] [facility] Pager: [phone], If no answer or between  15:00h - 7:00h: call [phone]  06/25/2021 9:24 AM    LABS    PULMONARY Recent Labs  Lab 06/18/2021 1130  PHART 7.358  PCO2ART 44.7  PO2ART 148*  HCO3 24.5  O2SAT 99.2    CBC Recent Labs  Lab 06/23/21 0320 06/23/21 1422 06/24/21 0601 06/25/21 0325  HGB 7.3* 6.7* 7.9* 8.0*  HCT 23.0*  --  24.8* 25.0*  WBC 13.7*  --  19.2* 11.9*  PLT 308  --  276 231    COAGULATION No results for input(s): INR in the last 168 hours.  CARDIAC  No results for input(s): TROPONINI in the last 168 hours. No results for input(s): PROBNP in the last 168 hours.   CHEMISTRY Recent Labs  Lab 06/05/2021 0406 06/27/2021 1701 06/23/21 0320 06/24/21 0744 06/25/21 0325  NA 133* 134* 131* 135 135  K 3.8 4.0 3.8 3.6 3.0*  CL 90* 100 101 102 99  CO2 32 '25 25 25 28  '$ GLUCOSE 136* 198* 162* 137* 131*  BUN 26* 26* 33* 34* 26*  CREATININE 0.88 0.95 1.22 1.28* 0.97  CALCIUM 9.4 7.7* 7.2* 7.5* 7.3*  MG  --   --  1.7  --  1.9   Estimated Creatinine Clearance: 61.8 mL/min (by C-G formula based on SCr of 0.97 mg/dL).   LIVER Recent Labs  Lab 06/19/2021 0406 06/23/21 0320  AST 10* 64*  ALT 7 38  ALKPHOS 88 59  BILITOT 0.7 1.1  PROT 6.7 4.5*  ALBUMIN 3.1* 2.2*     INFECTIOUS Recent Labs  Lab 06/14/2021 0406  LATICACIDVEN 1.1     ENDOCRINE CBG (last 3)  Recent Labs    06/24/21 2305 06/25/21 0317 06/25/21 0724  GLUCAP 109* 130* 137*         IMAGING x48h  - image(s) personally visualized  -   highlighted in bold No results found.

## 2021-06-25 NOTE — Progress Notes (Signed)
Patent s/p emergent open abdominal colectomy and end ileostomy 07/04/2021 for obstructing colon mass.   Dx:  pT4a, pN1a rectosigmoid cancer causing obstruction  Patient is still in the hospital and most likely will be there for another week recovering.  Bell:   1.  Please set the patient up for GI Tumor Board.  Patient Care Team: Henreitta Cea, MD as PCP - General (Family Medicine) Michael Boston, MD as Consulting Physician (General Surgery)   2.  Alisha:  Please set up outpatient consult(s):  Dr Burr Medico or Benay Spice

## 2021-06-25 NOTE — Procedures (Signed)
Extubation Procedure Note  Patient Details:   Name: Daniel King DOB: 11/07/1945 MRN: [id number]   Airway Documentation:    Vent end date: 06/25/21 Vent end time: 1525   Evaluation  O2 sats: stable throughout Complications: No apparent complications Patient did tolerate procedure well. Bilateral Breath Sounds: Diminished   Yes  [name] 06/25/2021, 3:30 PM  PT able to whisper post extubation. Positive cuff leak pre extubation. Placed on BiPAP as ordered post intubation.

## 2021-06-25 NOTE — Progress Notes (Signed)
Pt refused BiPAP qhs.  Pt states that he is no longer able to tolerate the BiPAP mask on his face tonight while his NG tube is still in.  Pt educated on risk and benefits of wearing BiPAP qhs but still refused.  Pt encouraged to contact RT should he change his mind.

## 2021-06-25 NOTE — Progress Notes (Signed)
   Called back to room Gets intermittently tachycardic Known A Fib per wife Wife also reports COPD with triology at home x 6 weeks or so - uses it  EKG done - shows intermittent A Fib on precedex a. Off levophed  Plan Extubate to BiPAP (ok if NG tube there) Precedex gtt to continue Add caredizem gtt  Wife explained about high risk for extubation     SIGNATURE    Dr. Brand Males, M.D., F.C.C.P,  Pulmonary and Critical Care Medicine Staff Physician, [place] Director - Interstitial Lung Disease  Program  Pulmonary [place] at [place], [place], [address]  NPI Number:  NPI [id number]  Pager: [phone], If no answer  -> Check AMION or Try [phone] Telephone (clinical office): [phone] Telephone (research): [phone]  3:05 PM 06/25/2021

## 2021-06-25 NOTE — TOC Initial Note (Signed)
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 -Awaiting return of bowel fxn; appreciate assistance from WOCN -Oliguria; improving, creatinine normalized; Cont MIVF,  potassium being replaced, Anticipate 3rd space losses and volume shifts with having had large amount of colon removed (total abdominal colectomy), laparotomy.  -Ppx: SCDs; PPI; ordered SQH to start today -Wife present at bedside  TOC PLAN OF CARE: FOLLOWING FOR PROGRESSION AND VENT. Expected Discharge Plan: Home/Self Care Barriers to Discharge: Continued Medical Work up   Patient Goals and CMS Choice        Expected Discharge Plan and Services Expected Discharge Plan: Home/Self Care   Discharge Planning Services: CM Consult   Living arrangements for the past 2 months: Single Family Home                                      Prior Living Arrangements/Services Living arrangements for the past 2 months: Single Family Home Lives with:: Spouse Patient language and need for interpreter reviewed:: Yes               Criminal Activity/Legal Involvement Pertinent to Current Situation/Hospitalization: No - Comment as needed  Activities of Daily Living Home Assistive Devices/Equipment: Bedside commode/3-in-1, Hospital bed, Reacher, Oxygen, Walker (specify type), Wheelchair, Other (Comment) (trilogy machine, front wheeled walker) ADL Screening (condition at time of admission) Patient's cognitive ability adequate to safely complete daily activities?: Yes Is the patient deaf or have difficulty hearing?: No Does the patient have difficulty seeing, even when wearing glasses/contacts?: No Does the patient have difficulty concentrating, remembering, or making decisions?: No Patient able to express need for assistance with ADLs?: Yes Does the patient have difficulty dressing or bathing?: Yes Independently performs ADLs?: No Communication: Independent Dressing (OT): Needs assistance Is this a change from baseline?: Pre-admission baseline Grooming: Needs assistance Is this a change from baseline?: Pre-admission baseline Feeding: Needs assistance Is this a change from baseline?: Pre-admission baseline Bathing: Needs assistance Is this a change from baseline?: Pre-admission baseline Toileting: Needs assistance (patient wheelchair bound and will stand and pivot to bsc) Is this a change from baseline?: Pre-admission baseline In/Out Bed: Needs assistance Is this a change from baseline?: Pre-admission baseline Walks in Home: Dependent (wheelchair bound) Is this a change from baseline?: Pre-admission baseline Does the patient have difficulty walking or climbing stairs?: Yes (secondary to shortness of breath and weakness) Weakness of Legs: Both Weakness of Arms/Hands: Both  Permission Sought/Granted  Emotional Assessment Appearance:: Appears stated age Attitude/Demeanor/Rapport: Intubated (Following Commands or Not Following Commands)   Orientation: : Fluctuating Orientation (Suspected  and/or reported Sundowners) Alcohol / Substance Use: Not Applicable Psych Involvement: No (comment)  Admission diagnosis:  Bowel perforation ([place]) [K63.1] Mass of colon [K63.89] Ischemic colitis ([place]) [K55.9] Perforated bowel ([place]) [K63.1] Gangrene of colon ([place]) [K55.049] Patient Active Problem List   Diagnosis Date Noted   Perforated bowel ([place]) 06/26/2021   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

## 2021-06-25 NOTE — Consult Note (Addendum)
WOC Nurse Wound Consult Note: First post-op Vac dressing change performed with Dr Kae Heller present to assess wound appearance. Pt is sedated on the ventilator and tolerated with mod amt distress. Midline abd with full thickness post-op wound, beefy red, 17X4X.3cm, small amt pink drainage. Applied one piece black foam to 157m cont suction. WOC will plan to change again on Wed.  [place]isleNurse ostomy consult note Pt had ileostomy surgery performed 8/19. Ostomy pouch change performed with pt's wife.  Pt is sedated and on the ventilator and did not participate.   Stoma is red and viable, above skin level, 1 3/4 inches. Small amt bloody leakage around the base of the stoma has caused previous pouch to leak behind the barrier. small amt bloody liquid in the pouch, no stool or flatus.  Ostomy pouching: Applied barrier ring to attempt to maintain a seal, then 2 piece pouching system. Use supplies: barrier ring, Lawson # [phone] wafer LKellie Simmering# [id number], pouch Lawson # [id number]  2 sets of extra supplies left at the bedside for staff nurses' use.  Wife asked appropriate questions and was able to open and close the velcro to empty. [place] in [place]: Not yet  WOC team will continue to perform teaching sessions when pt is stable and out of ICU. Thank-you,  DJulien GirtMSN, [place]

## 2021-06-26 ENCOUNTER — Inpatient Hospital Stay (HOSPITAL_COMMUNITY): Payer: No Typology Code available for payment source

## 2021-06-26 DIAGNOSIS — G9340 Encephalopathy, unspecified: Secondary | ICD-10-CM

## 2021-06-26 DIAGNOSIS — R0603 Acute respiratory distress: Secondary | ICD-10-CM

## 2021-06-26 LAB — BLOOD GAS, ARTERIAL
Acid-Base Excess: 5 mmol/L — ABNORMAL HIGH (ref 0.0–2.0)
Bicarbonate: 30.8 mmol/L — ABNORMAL HIGH (ref 20.0–28.0)
O2 Saturation: 100 %
Patient temperature: 37
pCO2 arterial: 56.8 mmHg — ABNORMAL HIGH (ref 32.0–48.0)
pH, Arterial: 7.353 (ref 7.350–7.450)
pO2, Arterial: 182 mmHg — ABNORMAL HIGH (ref 83.0–108.0)

## 2021-06-26 LAB — CBC
HCT: 25 % — ABNORMAL LOW (ref 39.0–52.0)
Hemoglobin: 7.9 g/dL — ABNORMAL LOW (ref 13.0–17.0)
MCH: 28 pg (ref 26.0–34.0)
MCHC: 31.6 g/dL (ref 30.0–36.0)
MCV: 88.7 fL (ref 80.0–100.0)
Platelets: 263 10*3/uL (ref 150–400)
RBC: 2.82 MIL/uL — ABNORMAL LOW (ref 4.22–5.81)
RDW: 16.9 % — ABNORMAL HIGH (ref 11.5–15.5)
WBC: 11.5 10*3/uL — ABNORMAL HIGH (ref 4.0–10.5)
nRBC: 1 % — ABNORMAL HIGH (ref 0.0–0.2)

## 2021-06-26 LAB — GLUCOSE, CAPILLARY
Glucose-Capillary: 93 mg/dL (ref 70–99)
Glucose-Capillary: 99 mg/dL (ref 70–99)
Glucose-Capillary: 106 mg/dL — ABNORMAL HIGH (ref 70–99)
Glucose-Capillary: 102 mg/dL — ABNORMAL HIGH (ref 70–99)
Glucose-Capillary: 115 mg/dL — ABNORMAL HIGH (ref 70–99)
Glucose-Capillary: 100 mg/dL — ABNORMAL HIGH (ref 70–99)

## 2021-06-26 LAB — ECHOCARDIOGRAM COMPLETE
Area-P 1/2: 4.49 cm2
Calc EF: 65.6 %
Height: 72 in
S' Lateral: 2.6 cm
Single Plane A2C EF: 72.6 %
Single Plane A4C EF: 56.4 %
Weight: 2342.17 oz

## 2021-06-26 LAB — PHOSPHORUS: Phosphorus: 1.4 mg/dL — ABNORMAL LOW (ref 2.5–4.6)

## 2021-06-26 LAB — COMPREHENSIVE METABOLIC PANEL
ALT: 114 U/L — ABNORMAL HIGH (ref 0–44)
AST: 81 U/L — ABNORMAL HIGH (ref 15–41)
Albumin: 2.2 g/dL — ABNORMAL LOW (ref 3.5–5.0)
Alkaline Phosphatase: 68 U/L (ref 38–126)
Anion gap: 9 (ref 5–15)
BUN: 16 mg/dL (ref 8–23)
CO2: 31 mmol/L (ref 22–32)
Calcium: 7.5 mg/dL — ABNORMAL LOW (ref 8.9–10.3)
Chloride: 99 mmol/L (ref 98–111)
Creatinine, Ser: 0.76 mg/dL (ref 0.61–1.24)
GFR, Estimated: >60 mL/min (ref >60)
Glucose, Bld: 101 mg/dL — ABNORMAL HIGH (ref 70–99)
Potassium: 3.8 mmol/L (ref 3.5–5.1)
Sodium: 139 mmol/L (ref 135–145)
Total Bilirubin: 0.8 mg/dL (ref 0.3–1.2)
Total Protein: 5 g/dL — ABNORMAL LOW (ref 6.5–8.1)

## 2021-06-26 LAB — SURGICAL PATHOLOGY

## 2021-06-26 LAB — PSA: Prostatic Specific Antigen: 1.84 ng/mL (ref 0.00–4.00)

## 2021-06-26 LAB — MAGNESIUM: Magnesium: 1.9 mg/dL (ref 1.7–2.4)

## 2021-06-26 MED ORDER — POTASSIUM PHOSPHATES 15 MMOLE/5ML IV SOLN: 24.0000 mmol | Freq: Once | INTRAVENOUS | Status: DC

## 2021-06-26 MED ORDER — MAGNESIUM SULFATE 2 GM/50ML IV SOLN
2.0000 g | Freq: Once | INTRAVENOUS | Status: AC
  Administered 2021-06-26: 2 g via INTRAVENOUS
  Filled 2021-06-26: qty 50

## 2021-06-26 MED ORDER — DEXMEDETOMIDINE HCL IN NACL 200 MCG/50ML IV SOLN
0.2000 ug/kg/h | INTRAVENOUS | Status: DC
Start: 2021-06-26 — End: 2021-06-28
  Administered 2021-06-26 (×2): 0.7 ug/kg/h via INTRAVENOUS
  Administered 2021-06-26: 0.6 ug/kg/h via INTRAVENOUS
  Administered 2021-06-26: 0.4 ug/kg/h via INTRAVENOUS
  Administered 2021-06-26: 0.7 ug/kg/h via INTRAVENOUS
  Administered 2021-06-26: 0.8 ug/kg/h via INTRAVENOUS
  Administered 2021-06-27: 0.3 ug/kg/h via INTRAVENOUS
  Administered 2021-06-27: 0.7 ug/kg/h via INTRAVENOUS
  Administered 2021-06-27: 0.6 ug/kg/h via INTRAVENOUS
  Administered 2021-06-28: 0.3 ug/kg/h via INTRAVENOUS
  Filled 2021-06-26 (×2): qty 50
  Filled 2021-06-26: qty 100
  Filled 2021-06-26 (×6): qty 50

## 2021-06-26 MED ORDER — FUROSEMIDE 10 MG/ML IJ SOLN
20.0000 mg | Freq: Once | INTRAMUSCULAR | Status: AC
  Administered 2021-06-26: 20 mg via INTRAVENOUS
  Filled 2021-06-26: qty 2

## 2021-06-26 MED ORDER — IPRATROPIUM-ALBUTEROL 0.5-2.5 (3) MG/3ML IN SOLN
3.0000 mL | RESPIRATORY_TRACT | Status: DC
  Administered 2021-06-26 – 2021-06-29 (×17): 3 mL via RESPIRATORY_TRACT
  Filled 2021-06-26 (×18): qty 3

## 2021-06-26 MED ORDER — SODIUM PHOSPHATES 45 MMOLE/15ML IV SOLN
30.0000 mmol | Freq: Once | INTRAVENOUS | Status: AC
  Administered 2021-06-26: 30 mmol via INTRAVENOUS
  Filled 2021-06-26: qty 10

## 2021-06-26 NOTE — Consult Note (Addendum)
NAME:  Daniel King, MRN:  [id number], DOB:  April 05, 1946, LOS: 4 ADMISSION DATE:  06/27/2021, CONSULTATION DATE:  8/19 REFERRING MD:  Dr. Johney King, CHIEF COMPLAINT:  Post-operative respiratory failure    bRIEF - also MRN duplicate is MRN:  [id number]  74 y/o M who presented to [facility] on 8/19 with reports of 24 hours of abdominal pain that worsened the am of presentation.   He reported his last BM on 8/18, prior to that was one week ago.  On presentation he was note to have abdominal distention, tenderness.  CT of the abdomen was assessed with concerns for primary colon cancer in the mid sigmoid colon complicated by colonic obstruction with pneumatosis involving the cecum and ascending colon, consistent with ischemic colitis. Evidence of bowel perforation with small volume pneumoperitoneum.  He was evaluated by CCS and recommended for open colectomy / ileostomy.  He was on plavix prior to admit.  He was started on IV zosyn.  Intraoperative findings notable for massively dilated colon, cecum and ascending colon with ischemia and patches of phlegmon & gangrene with approximately 3 feet of colon removed, small volume pneumoperitoneum, rocky hard rectosigmoid mass worrisome for malignancy.  EBL estimated at 116m.  He had intraoperative hypotension requiring vasopressors.  The patient was returned to the ICU post surgery on mechanical ventilation. Post op Hgb 7.6.  The patient was given 1 unit PRBC.    PCCM consulted for assistance with ICU care.    Pertinent  Medical History  COPD  -  Managed by [facility] Dr. CGwenette King.  Smoked 60 yr 1.567yr Recently 2 PPD , quit at discharge  Chronic Hypoxic and hypercapic Respiratory Failure - on home O2 and Trilogy Atrial fibrillation  CVA - on plavix aspirin =- Prior admission , sedentary due to CVA in October 2021, low activity level and balance issues.  THC use [place] abusle - quit summer 2022 AAA: Previous vascular interventions: EVAR 10/04/2016 by Dr.  VeTheodis Satowith relining of graft and extension of right limb due to type I endoleak. I&D of right groin for infection with wound vac placement 11/07/2016.   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      In other chart with MRN [id number] has hypercapnia - saw Daniel King in clinic 05/10/21 and reported wearing trilogy 2h  per night at home Results for [name], RATLIFFMRN [id number] of 06/26/2021 10:18  Ref. Range 04/17/2021 08:48 04/17/2021 11:09 04/17/2021 13:11 04/18/2021 04:14 04/19/2021 19:16 04/20/2021 11:57 04/21/2021 07:28  pH, Arterial Latest Ref Range: 7.350 - 7.450  7.244 (L)  7.265 (L) 7.324 (L) 7.290 (L) 7.311 (L) 7.349 (L)  pCO2 arterial Latest Ref Range: 32.0 - 48.0 mmHg 83.6 (HH)  66.2 (HH) 69.3 (HH) 78.9 (HH) 79.6 (HH) 71.2 (HH)  pO2, Arterial Latest Ref Range: 83.0 - 108.0 mmHg 108  88 86 96.1 106 77.6 (L)      Significant Hospital Events: Including procedures, antibiotic start and stop dates in addition to other pertinent events   8/19 admit with abd pain, CT with concern for perforation to OR for ex-lap with abdominal colectomy, colostomy. Returned to ICU on vent.  MRSA PCR positive COVID PCR negative Flu PCR negative Left IJ 8/19  Foley 8/19 > 8/22 ETT 8/19  >>8/22 06/24/21 - NAEON. UOP a bit better, still relatively  oliguric. Cr stable. Alert, on SBT. Anticipate extubation. 06/25/21 : Failed SBT yesterday.  Lasix given.  Yesterday afebrile.  White count down to 11 point 9K.  Remains on ventilator 40% FiO2.  On fentanyl infusion, Precedex infusion, Levophed infusion off.  Only on vasopressin infusion.  Getting magnesium sulfate and potassium.  Remains on Zosyn.  Surgery noted that his oliguria is improving and creatinine normalized and to anticipate third space losses given colectomy - EXTIUBATED  Interim History / Subjective:    06/26/21 - remains  extubated. Hypercapnic. Did not do much BiPAP due to NG tube. On amio gtt and rate controlled . This AM - agitated and confused and hallucinating despite precedex gtt. Wheezing. Chart review shows severe Protein cal malnutn pre admit alb 2.,2. EKG now with NSR - QTc 511 mosec. On precedex, amio gtt, comparize prn  Chart review shows that he has severe protein calorie malnutrition and failure to thrive at baseline albumin 2.2, prostate cancer per the wife well-controlled but information from Dr. Bess King pending.  He has a history of AAA repair.  He has a history of alcohol abuse remission few months.   Objective   Blood pressure (!) 123/104, pulse 71, temperature (!) 97.5 F (36.4 C), temperature source Axillary, resp. rate 20, height 6' (1.829 m), weight 66.4 kg, SpO2 100 %.    Vent Mode: PSV;CPAP FiO2 (%):  [40 %] 40 % PEEP:  [5 cmH20] 5 cmH20 Pressure Support:  [5 cmH20] 5 cmH20   Intake/Output Summary (Last 24 hours) at 06/26/2021 1013 Last data filed at 06/26/2021 0931 Gross per 24 hour  Intake 2203.56 ml  Output 2925 ml  Net -721.44 ml   Filed Weights   06/16/2021 0356 06/23/21 0456 06/24/21 0500  Weight: 68 kg 67.9 kg 66.4 kg    Examination: G cachectic male with an NG tube.  Partially oriented but intermittently hallucinating.  Slight barrel chest with oxygen on.  Has got wheezing which was not there yesterday sinus rhythm.  Abdomen postop.  No sinus no clubbing no edema.  Looks very deconditioned and cachectic.     Resolved Hospital Problem list     Assessment & Plan:   Acute Bowel Perforation and Pneumoatosis s/p Open Colectomy with Ileostomy - present on admission and chief admission problem 06/21/2021 - concner for malignancy at admit (colonic obstruction likely secondary to sigmoid carcinoma.  Right-sided colon appears to be ischemic and there is perforation in the right upper quadrant)  06/26/2021 -has a colostomy.  Surgery following.  Plan -post operative and wound care  per CCS  -follow up pathology  06/04/21 -will need WOC assistance for teaching once able to perform self care of ostomy    . COLON, RECTO-SIGMOID STRICTURE AND OMENTUM, RESECTION: pT4a, pN1a -present on admission problem.  Diagnoses made 06/29/2021 but available 06/26/2021 - Invasive colorectal adenocarcinoma, 4 cm.  - Focal changes consistent with serosal involvement, see comment.  - Margins not involved.  - Metastatic carcinoma in one of thirty-six lymph nodes (1/36).  - Two satellite tumor nodules.  Plan  - Check PSA given history of prostate cancer -Prognosis and recovery plan based on several [place] surgery input.  Current every day smoker - 60ppd hx - prresent on admit COPD - chronic hyppoxemic and hypercapnic resp failure - 2L baseline with trilogy  - baseline prior to and present on admit Acute Hypoxic Respiratory Failure  due to post op - present at admission -s/p extubation 06/25/2021   06/26/2021 - 06/26/2021 - > wheezing  and hypercapnic but refusing BiPAP.  Needing oxygen.  Currently on acute and chronic hypoxemic and hypercapnic respiratory failure    PLan -BiPAP at night -Oxygen for pulse ox goal greater than 88% -Change nebulizer to DuoNeb every 4 hours and Pulmicort twice daily - Empiric Lasix x1 - If above does not help wheezing then start steroids  Acute metabolic encephalopathy -new onset 06/26/2021  = Multifactorial and likely metabolic and toxic hypercapnia and other reasons  Plan - Precedex infusion - Haldol x1 after correcting magnesium and improving QTC -Goal magnesium greater than 2 - Goal potassium greater than 4 - Goal phosphorus normal -Intubate if worse   Prolonged QTC 511 ms-new 06/26/2021   Plan  - DC amio infusion -Continue Precedex would monitor QTC - DC Compazine as needed - DC Zofran as needed  A. fib RVR 06/25/2021.  Did not respond to Cardizem and started on amnio  06/26/2021-in sinus rhythm.  QTC 511 ms  Plan - DC amnio - Check  echocardiogram  Shock: Septic, hypovolemia, blood loss anemia.  06/26/2021 - off levophed   Plan  - MAP goal> 65 -DC Levophed off MAR  Acute Kidney Injury:  Presumed ATN in setting of shock.  06/26/2021- improving  Plan --NE MAP > 65  Anemia of blood loss and critical illness - presnte on admit 10.6gm% -  sp 1u PRBC 8/19  06/26/2021 - mild oozing via colostomy + yesterday  Plan  - - PRBC for hgb </= 6.9gm%    - exceptions are   -  if ACS susepcted/confirmed then transfuse for hgb </= 8.0gm%,  or    -  active bleeding with hemodynamic instability, then transfuse regardless of hemoglobin value   At at all times try to transfuse 1 unit prbc as possible with exception of active hemorrhage  Hypokalemia -mild 06/26/2021  hypomagnesemia - mild 8 06/26/2021 Hypophosphatemia-moderate 06/26/2021  Plan  - replete  Hx Prostate Cancer  stage 4 micrometastic - baseline prior to and on admit -  On Xtandi at home. Seen by Dr Tresa Moore 3 months prior to admit. Per Dr Tresa Moore - Per Under control PSA 0.64 - stage 4 micrometastatic (CT and bone scan negative for gross disease but Positive blood work) - Good prognosis  Plan  -hold home meds Xtandi while in hospital (ok to hold for few weeks per Dr  Tresa Moore) - check PSA - Proistate CA not contraindication for colon cancer Rx    HTN, HLD -hold home metoprolol, norvasc, lipitor, lisinopril, lopressor   Hx CVA October 2021- on DAPT prior to admit  Plan -hold home plavix, ASA     Severe protein calorie Malnutrition @ admit 8/19 (alb 2.2 oJune 2022, cachexia with copd, decreased po intake leading into admit) Failure to thrive due to multiple admissions and protein calorie malnutrition -prior to and present on admission Cachexia and Limited mobility and Physical deconditioning -prior to and present on admission  Plan  - defer timing of enteral feeding to CCS  -Likely needs SNF rehab  Best Practice (right click and "Reselect all SmartList  Selections" daily)  Diet/type: NPO DVT prophylaxis: SCD GI prophylaxis: PPI Lines: Central line - L IJ 8/19  Foley:  Placed8/19 - remove 8/22 Code Status:  full code  Last date of multidisciplinary goals of care discussion - by CCS for 06/25/21. Wife update 06/26/21      ATTESTATION & SIGNATURE   The patient Daniel King is critically ill with multiple organ systems failure and requires high complexity decision  making for assessment and support, frequent evaluation and titration of therapies, application of advanced monitoring technologies and extensive interpretation of multiple databases.   Critical Care Time devoted to patient care services described in this note is  40  Minutes. This time reflects time of care of this signee Dr Brand Males. This critical care time does not reflect procedure time, or teaching time or supervisory time of PA/NP/Med student/Med Resident etc but could involve care discussion time     Dr. Brand Males, M.D., [facility].C.P Pulmonary and Critical Care Medicine Staff Physician [facility] Critical [facility] Pager: [phone], If no answer or between  15:00h - 7:00h: call [phone]  06/26/2021 10:57 AM    LABS    PULMONARY Recent Labs  Lab 06/21/2021 1130 06/26/21 0750  PHART 7.358 7.353  PCO2ART 44.7 56.8*  PO2ART 148* 182*  HCO3 24.5 30.8*  O2SAT 99.2 100.0    CBC Recent Labs  Lab 06/24/21 0601 06/25/21 0325 06/26/21 0240  HGB 7.9* 8.0* 7.9*  HCT 24.8* 25.0* 25.0*  WBC 19.2* 11.9* 11.5*  PLT 276 231 263    COAGULATION No results for input(s): INR in the last 168 hours.  CARDIAC  No results for input(s): TROPONINI in the last 168 hours. No results for input(s): PROBNP in the last 168 hours.   CHEMISTRY Recent Labs  Lab 06/23/21 0320 06/24/21 0744 06/25/21 0325 06/25/21 1854 06/26/21 0240  NA 131* 135 135 137 139  K 3.8 3.6 3.0* 2.8* 3.8  CL 101 102 99 98 99  CO2 '25 25 28 30 31   '$ GLUCOSE 162* 137* 131* 120* 101*  BUN 33* 34* 26* 20 16  CREATININE 1.22 1.28* 0.97 0.88 0.76  CALCIUM 7.2* 7.5* 7.3* 7.3* 7.5*  MG 1.7  --  1.9  --  1.9  PHOS  --   --   --   --  1.4*   Estimated Creatinine Clearance: 74.9 mL/min (by C-G formula based on SCr of 0.76 mg/dL).   LIVER Recent Labs  Lab 06/14/2021 0406 06/23/21 0320 06/26/21 0240  AST 10* 64* 81*  ALT 7 38 114*  ALKPHOS 88 59 68  BILITOT 0.7 1.1 0.8  PROT 6.7 4.5* 5.0*  ALBUMIN 3.1* 2.2* 2.2*     INFECTIOUS Recent Labs  Lab 06/09/2021 0406  LATICACIDVEN 1.1     ENDOCRINE CBG (last 3)  Recent Labs    06/25/21 2353 06/26/21 0401 06/26/21 0730  GLUCAP 101* 93 99         IMAGING x48h  - image(s) personally visualized  -   highlighted in bold DG CHEST PORT 1 VIEW  Result Date: 06/26/2021 CLINICAL DATA:  Respiratory failure EXAM: PORTABLE CHEST 1 VIEW COMPARISON:  06/23/2021 FINDINGS: Endotracheal tube is no longer visualized. Enteric tube courses below the diaphragm with distal tip beyond the inferior margin of the film. Stable left IJ approach central venous catheter. Stable cardiomediastinal contours. Chronically coarsened interstitial markings. Progressive interstitial opacities at the lung bases. Small bilateral pleural effusions. No pneumothorax. IMPRESSION: 1. Interval extubation. 2. Small bilateral pleural effusions with progressive interstitial opacities at the lung bases. Electronically Signed   By: Davina Poke D.O.   On: 06/26/2021 08:21

## 2021-06-26 NOTE — Progress Notes (Signed)
PT Cancellation Note  Patient Details Name: Treyvor Marsala MRN: [id number] DOB: 09-06-1946   Cancelled Treatment:    Reason Eval/Treat Not Completed: Medical issues which prohibited therapy   Claretha Cooper 06/26/2021, 5:13 PM Tresa Endo PT Acute Rehabilitation Services Pager [phone] Office [phone]

## 2021-06-26 NOTE — Progress Notes (Addendum)
Subjective Extubated yesterday afternoon.  Some delirium overnight and this morning.  Objective: Vital signs in last 24 hours: Temp:  [97.5 F (36.4 C)-98.3 F (36.8 C)] 97.5 F (36.4 C) (08/23 0815) Pulse Rate:  [65-142] 71 (08/23 0900) Resp:  [16-25] 20 (08/23 0900) BP: (84-174)/(44-136) 123/104 (08/23 0900) SpO2:  [92 %-100 %] 100 % (08/23 0900) FiO2 (%):  [40 %] 40 % (08/22 1200) Last BM Date: 06/24/21  Intake/Output from previous day: 08/22 0701 - 08/23 0700 In: 1180.9 [I.V.:923.9; IV Piggyback:256.9] Out: 2925 [Urine:2850; Emesis/NG output:50; Stool:25] Intake/Output this shift: Total I/O In: 1022.7 [I.V.:430.2; IV Piggyback:592.6] Out: -   Gen: Alert, confused, answering some questions appropriately CV: RRR Pulm: Normal work of breathing Abd: Soft, nondistended, appropriately tender. Midline wound VAC in place with serosanguineous output stoma viable and edematous with ostomy sweat but no real stool output yet Ext: SCDs in place  Lab Results: CBC  Recent Labs    06/25/21 0325 06/26/21 0240  WBC 11.9* 11.5*  HGB 8.0* 7.9*  HCT 25.0* 25.0*  PLT 231 263    BMET Recent Labs    06/25/21 1854 06/26/21 0240  NA 137 139  K 2.8* 3.8  CL 98 99  CO2 30 31  GLUCOSE 120* 101*  BUN 20 16  CREATININE 0.88 0.76  CALCIUM 7.3* 7.5*    PT/INR No results for input(s): LABPROT, INR in the last 72 hours. ABG Recent Labs    06/26/21 0750  PHART 7.353  HCO3 30.8*     Studies/Results:  Anti-infectives: Anti-infectives (From admission, onward)    Start     Dose/Rate Route Frequency Ordered Stop   06/29/2021 1400  piperacillin-tazobactam (ZOSYN) IVPB 3.375 g        3.375 g 12.5 mL/hr over 240 Minutes Intravenous Every 8 hours 06/30/2021 1357 06/27/21 1414   06/28/2021 0745  cefoTEtan (CEFOTAN) 2 g in sodium chloride 0.9 % 100 mL IVPB        2 g 200 mL/hr over 30 Minutes Intravenous On call to O.R. 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  Extubated yesterday.  Some delirium and on Precedex this morning. -If patient is not deemed safe to swallow by speech evaluation today, okay to place cortrack/do trickle tube feeds but would not advance, awaiting return of bowel fxn; appreciate assistance from WOCN -Ppx: SCDs; PPI; SQH started 8/21 -Wife present at bedside.  I discussed the pathology results with her-[id number] obstructing rectosigmoid cancer.  Dr. Johney Maine has initiated arrangements for GI tumor board and outpatient oncology follow-up.   LOS:  4 days   [facility] Use [email or web address] to contact on call provider

## 2021-06-26 NOTE — Progress Notes (Signed)
Pt refused nocturnal bipap at this time.  RN aware.  Pt was advised that RT is available all night should he change his mind.  Bipap remains in room on standby.

## 2021-06-26 NOTE — Evaluation (Signed)
SLP Cancellation Note  Patient Details Name: Daniel King MRN: [id number] DOB: June 03, 1946   Cancelled treatment:       Reason Eval/Treat Not Completed: Other (comment) (pt npo with NG in place that is hooked to suction, will continue efforts)  [name], MS [place] Office [phone] Pager [phone]   Macario Golds 06/26/2021, 12:15 PM

## 2021-06-26 NOTE — Progress Notes (Signed)
Notified Lab that ABG being sent for analysis. 

## 2021-06-26 NOTE — Progress Notes (Signed)
OT Cancellation Note  Patient Details Name: Ahking Pretzer MRN: [id number] DOB: 1946-04-24   Cancelled Treatment:    Reason Eval/Treat Not Completed: Medical issues which prohibited therapy patient is noted to have increased blood pressure and agitation per nursing. Will attempt tomorrow.   Jackelyn Poling OTR/L, [place] Acute Rehabilitation Department Office# [phone] Pager# [phone]   [place] 06/26/2021, 3:18 PM

## 2021-06-26 NOTE — Progress Notes (Signed)
[facility] Progress Note Patient Name: Daniel King DOB: 05-11-1946 MRN: [id number]   Date of Service  06/26/2021  HPI/Events of Note  phos 1.4 K+ 3.8 Pt has one more run of K+ left  eICU Interventions  Cr normal.  Sod po4 in dextrose 30 mmols IV ordered. Mag ok.   Discussed with RN.     Intervention Category Minor Interventions: Electrolytes abnormality - evaluation and management  Elmer Sow 06/26/2021, 5:43 AM

## 2021-06-26 NOTE — Progress Notes (Signed)
eLink Physician-Brief Progress Note Patient Name: Daniel King DOB: [birth year] MRN: [id number]   Date of Service  06/26/2021  HPI/Events of Note  Hallucinating, was controlled when was on precedex as per RN discussion. A fib on amiodarone. On rate control meds.  eICU Interventions  - re ordered precedex gtt. Asp precautions. Watch for brady while on precedex and amiodarone. Keep K and Mag >4 and 2 . Replace if low.      Intervention Category Minor Interventions: Agitation / anxiety - evaluation and management  Elmer Sow 06/26/2021, 3:08 AM

## 2021-06-26 NOTE — Progress Notes (Signed)
  Echocardiogram 2D Echocardiogram has been performed.  Daniel King 06/26/2021, 3:01 PM

## 2021-06-27 ENCOUNTER — Inpatient Hospital Stay (HOSPITAL_COMMUNITY): Payer: No Typology Code available for payment source

## 2021-06-27 ENCOUNTER — Ambulatory Visit: Payer: Medicare Other | Admitting: Adult Health

## 2021-06-27 DIAGNOSIS — J9601 Acute respiratory failure with hypoxia: Secondary | ICD-10-CM | POA: Diagnosis not present

## 2021-06-27 DIAGNOSIS — J449 Chronic obstructive pulmonary disease, unspecified: Secondary | ICD-10-CM

## 2021-06-27 DIAGNOSIS — K55049 Acute infarction of large intestine, extent unspecified: Secondary | ICD-10-CM | POA: Diagnosis not present

## 2021-06-27 DIAGNOSIS — E440 Moderate protein-calorie malnutrition: Secondary | ICD-10-CM | POA: Diagnosis not present

## 2021-06-27 DIAGNOSIS — Z9981 Dependence on supplemental oxygen: Secondary | ICD-10-CM

## 2021-06-27 LAB — GLUCOSE, CAPILLARY
Glucose-Capillary: 110 mg/dL — ABNORMAL HIGH (ref 70–99)
Glucose-Capillary: 111 mg/dL — ABNORMAL HIGH (ref 70–99)
Glucose-Capillary: 111 mg/dL — ABNORMAL HIGH (ref 70–99)
Glucose-Capillary: 89 mg/dL (ref 70–99)
Glucose-Capillary: 90 mg/dL (ref 70–99)
Glucose-Capillary: 94 mg/dL (ref 70–99)

## 2021-06-27 LAB — COMPREHENSIVE METABOLIC PANEL
ALT: 84 U/L — ABNORMAL HIGH (ref 0–44)
AST: 42 U/L — ABNORMAL HIGH (ref 15–41)
Albumin: 2.2 g/dL — ABNORMAL LOW (ref 3.5–5.0)
Alkaline Phosphatase: 72 U/L (ref 38–126)
Anion gap: 13 (ref 5–15)
BUN: 11 mg/dL (ref 8–23)
CO2: 29 mmol/L (ref 22–32)
Calcium: 7.5 mg/dL — ABNORMAL LOW (ref 8.9–10.3)
Chloride: 97 mmol/L — ABNORMAL LOW (ref 98–111)
Creatinine, Ser: 0.67 mg/dL (ref 0.61–1.24)
GFR, Estimated: >60 mL/min (ref >60)
Glucose, Bld: 87 mg/dL (ref 70–99)
Potassium: 3.1 mmol/L — ABNORMAL LOW (ref 3.5–5.1)
Sodium: 139 mmol/L (ref 135–145)
Total Bilirubin: 1.3 mg/dL — ABNORMAL HIGH (ref 0.3–1.2)
Total Protein: 5.1 g/dL — ABNORMAL LOW (ref 6.5–8.1)

## 2021-06-27 LAB — PHOSPHORUS: Phosphorus: 2.1 mg/dL — ABNORMAL LOW (ref 2.5–4.6)

## 2021-06-27 LAB — MAGNESIUM: Magnesium: 1.9 mg/dL (ref 1.7–2.4)

## 2021-06-27 LAB — CBC
HCT: 25.7 % — ABNORMAL LOW (ref 39.0–52.0)
Hemoglobin: 8.2 g/dL — ABNORMAL LOW (ref 13.0–17.0)
MCH: 28.4 pg (ref 26.0–34.0)
MCHC: 31.9 g/dL (ref 30.0–36.0)
MCV: 88.9 fL (ref 80.0–100.0)
Platelets: 291 10*3/uL (ref 150–400)
RBC: 2.89 MIL/uL — ABNORMAL LOW (ref 4.22–5.81)
RDW: 16.8 % — ABNORMAL HIGH (ref 11.5–15.5)
WBC: 9.3 10*3/uL (ref 4.0–10.5)
nRBC: 1.1 % — ABNORMAL HIGH (ref 0.0–0.2)

## 2021-06-27 LAB — PSA: Prostatic Specific Antigen: 1.85 ng/mL (ref 0.00–4.00)

## 2021-06-27 MED ORDER — POTASSIUM PHOSPHATES 15 MMOLE/5ML IV SOLN
20.0000 mmol | Freq: Once | INTRAVENOUS | Status: AC
  Administered 2021-06-27: 20 mmol via INTRAVENOUS
  Filled 2021-06-27 (×2): qty 6.67

## 2021-06-27 MED ORDER — HYDRALAZINE HCL 20 MG/ML IJ SOLN
20.0000 mg | Freq: Once | INTRAMUSCULAR | Status: AC
  Administered 2021-06-27: 20 mg via INTRAVENOUS
  Filled 2021-06-27: qty 1

## 2021-06-27 MED ORDER — FOLIC ACID 5 MG/ML IJ SOLN
1.0000 mg | Freq: Every day | INTRAMUSCULAR | Status: DC
  Administered 2021-06-27: 1 mg via INTRAVENOUS
  Filled 2021-06-27 (×2): qty 0.2

## 2021-06-27 MED ORDER — IOHEXOL 300 MG/ML  SOLN
50.0000 mL | Freq: Once | INTRAMUSCULAR | Status: AC | PRN
  Administered 2021-06-27: 50 mL via ORAL

## 2021-06-27 MED ORDER — MAGNESIUM SULFATE 2 GM/50ML IV SOLN
2.0000 g | Freq: Once | INTRAVENOUS | Status: AC
  Administered 2021-06-27: 2 g via INTRAVENOUS
  Filled 2021-06-27: qty 50

## 2021-06-27 MED ORDER — BOOST / RESOURCE BREEZE PO LIQD CUSTOM
1.0000 | Freq: Three times a day (TID) | ORAL | Status: DC
  Administered 2021-06-27 – 2021-07-02 (×7): 1 via ORAL

## 2021-06-27 MED ORDER — POTASSIUM CHLORIDE 10 MEQ/50ML IV SOLN: 10.0000 meq | INTRAVENOUS | Status: DC

## 2021-06-27 MED ORDER — POTASSIUM CHLORIDE 10 MEQ/100ML IV SOLN
10.0000 meq | INTRAVENOUS | Status: AC
  Administered 2021-06-27 (×4): 10 meq via INTRAVENOUS
  Filled 2021-06-27 (×4): qty 100

## 2021-06-27 MED ORDER — METOPROLOL TARTRATE 12.5 MG HALF TABLET
12.5000 mg | ORAL_TABLET | Freq: Two times a day (BID) | ORAL | Status: DC
  Administered 2021-06-27 – 2021-06-28 (×3): 12.5 mg via ORAL
  Filled 2021-06-27 (×3): qty 1

## 2021-06-27 MED ORDER — CHLORHEXIDINE GLUCONATE CLOTH 2 % EX PADS
6.0000 | MEDICATED_PAD | Freq: Every day | CUTANEOUS | Status: DC
  Administered 2021-06-27 – 2021-07-09 (×13): 6 via TOPICAL

## 2021-06-27 MED ORDER — HYDROMORPHONE HCL 1 MG/ML IJ SOLN
0.5000 mg | INTRAMUSCULAR | Status: DC | PRN
  Administered 2021-06-29 (×2): 0.5 mg via INTRAVENOUS
  Filled 2021-06-27 (×2): qty 1

## 2021-06-27 MED ORDER — AMLODIPINE BESYLATE 5 MG PO TABS
5.0000 mg | ORAL_TABLET | Freq: Every day | ORAL | Status: DC
  Administered 2021-06-27 – 2021-06-28 (×2): 5 mg via ORAL
  Filled 2021-06-27 (×2): qty 1

## 2021-06-27 MED ORDER — ACETAMINOPHEN 500 MG PO TABS
1000.0000 mg | ORAL_TABLET | Freq: Four times a day (QID) | ORAL | Status: DC
  Administered 2021-06-27 – 2021-06-28 (×5): 1000 mg via NASOGASTRIC
  Filled 2021-06-27 (×5): qty 2

## 2021-06-27 MED ORDER — OXYCODONE HCL 5 MG PO TABS
5.0000 mg | ORAL_TABLET | ORAL | Status: DC | PRN
  Administered 2021-06-27: 5 mg via NASOGASTRIC
  Filled 2021-06-27: qty 1

## 2021-06-27 MED ORDER — THIAMINE HCL 100 MG/ML IJ SOLN
100.0000 mg | Freq: Every day | INTRAMUSCULAR | Status: DC
  Administered 2021-06-27: 100 mg via INTRAVENOUS
  Filled 2021-06-27: qty 2

## 2021-06-27 MED ORDER — ONDANSETRON HCL 4 MG/2ML IJ SOLN
4.0000 mg | Freq: Four times a day (QID) | INTRAMUSCULAR | Status: DC | PRN
  Administered 2021-06-30: 4 mg via INTRAVENOUS
  Filled 2021-06-27: qty 2

## 2021-06-27 NOTE — Progress Notes (Signed)
Spoke with patient about nocturnal BiPAP. He conveys to me that he does not always wear his mask at home and does not wish to use it tonight. However, he agrees that I may check in on him and ask again at a later time. Equipment remains on standby.

## 2021-06-27 NOTE — Consult Note (Addendum)
WOC Nurse Wound Consult Note: Vac dressing change performed.  Pt was medicated for pain prior to the procedure and tolerated without apparent distress. Midline abd with full thickness post-op wound, beefy red, refer to previous progress notes for measurements,  small amt pink drainage. Applied one piece black foam applied to 140m cont suction. WOC will plan to change again on Fri.   WOC Nurse ostomy consult note Pt had ileostomy surgery performed 8/19. Ostomy pouch change performed with pt's wife present to watch and discuss.  Pt was drowsy while in ICU and did not participate.   Stoma is red and viable, above skin level, slightly prolapsed, 1 1/2 inches. Small amt bloody leakage around the base of the stoma, small amt bloody liquid in the pouch, no stool or flatus.  Ostomy pouching: Applied barrier ring to attempt to maintain a seal, then 2 piece pouching system. Use supplies: barrier ring, Lawson # [id number] wafer LKellie Simmering# [id number], pouch Lawson # [id number]  2 sets of extra supplies left at the bedside for staff nurses' use.  Wife asked appropriate questions and was able to open and close the velcro to empty. [place]  WJopp[place]am will continue to perform teaching sessions when pt is stable and out of ICU. Thank-you,  DJulien GirtMSN, [place]

## 2021-06-27 NOTE — Progress Notes (Signed)
OT Cancellation Note  Patient Details Name: Daniel King MRN: [id number] DOB: 1946/04/02   Cancelled Treatment:    Reason Eval/Treat Not Completed: Patient at procedure or test/ unavailable patient is off floor at swallow study. Will continue to follow.   Jackelyn Poling OTR/L, [place] Acute Rehabilitation Department Office# [phone] Pager# [phone]   [place] 06/27/2021, 12:43 PM

## 2021-06-27 NOTE — Progress Notes (Addendum)
NAME:  Daniel King, MRN:  [id number], DOB:  September 15, 1946, LOS: 5 ADMISSION DATE:  06/08/2021, CONSULTATION DATE:  8/19 REFERRING MD:  Dr. Johney Maine, CHIEF COMPLAINT:  Post-operative respiratory failure    bRIEF - also MRN duplicate is MRN:  [id number]  75 y/o M who presented to [facility] on 8/19 with reports of 24 hours of abdominal pain that worsened the am of presentation.   He reported his last BM on 8/18, prior to that was one week ago.  On presentation he was note to have abdominal distention, tenderness.  CT of the abdomen was assessed with concerns for primary colon cancer in the mid sigmoid colon complicated by colonic obstruction with pneumatosis involving the cecum and ascending colon, consistent with ischemic colitis. Evidence of bowel perforation with small volume pneumoperitoneum.  He was evaluated by CCS and recommended for open colectomy / ileostomy.  He was on plavix prior to admit.  He was started on IV zosyn.  Intraoperative findings notable for massively dilated colon, cecum and ascending colon with ischemia and patches of phlegmon & gangrene with approximately 3 feet of colon removed, small volume pneumoperitoneum, rocky hard rectosigmoid mass worrisome for malignancy.  EBL estimated at 126m.  He had intraoperative hypotension requiring vasopressors.  The patient was returned to the ICU post surgery on mechanical ventilation. Post op Hgb 7.6.  The patient was given 1 unit PRBC.    PCCM consulted for assistance with ICU care.    Pertinent  Medical History  COPD  -  Managed by [facility] Dr. CGwenette Greet.  Smoked 60 yr 1.573yr Recently 2 PPD , quit at discharge  Chronic Hypoxic and hypercapic Respiratory Failure - on home O2 and Trilogy Atrial fibrillation  CVA - on plavix aspirin =- Prior admission , sedentary due to CVA in October 2021, low activity level and balance issues.  THC use [place] abusle - quit summer 2022 AAA: Previous vascular interventions: EVAR 10/04/2016 by Dr.  VeTheodis Satowith relining of graft and extension of right limb due to type I endoleak. I&D of right groin for infection with wound vac placement 11/07/2016.   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   in other chart with MRN [id number] has hypercapnia - [facility] 05/10/21 and reported wearing trilogy 2h  per night at home   Significant Hospital Events: Including procedures, antibiotic start and stop dates in addition to other pertinent events   8/19 admit with abd pain, CT with concern for perforation to OR for ex-lap with abdominal colectomy, colostomy. Returned to ICU on vent.  MRSA PCR positive COVID PCR negative Flu PCR negative Left IJ 8/19  Foley 8/19 > 8/22 ETT 8/19  >>8/22 06/24/21 - NAEON. UOP a bit better, still relatively oliguric. Cr stable. Alert, on SBT. Anticipate extubation. 06/25/21 : Failed SBT yesterday.  Lasix given.  Yesterday afebrile.  White count down to 11 point 9K.  Remains on ventilator 40% FiO2.  On fentanyl infusion, Precedex infusion, Levophed infusion off.  Only on vasopressin infusion.  Getting magnesium sulfate and potassium.  Remains on Zosyn.  Surgery noted that his oliguria is improving and creatinine normalized and to anticipate third space losses given colectomy -extubated 8/23 remains extubated. Hypercapnic. Did not do much BiPAP due to NG tube. On amio gtt and rate controlled . This AM - agitated and confused  and hallucinating despite precedex gtt. Wheezing. Chart review shows severe Protein cal malnutn pre admit alb 2.,2. EKG now with NSR - QTc 511 mosec. On precedex, amio gtt, comparize prn.  Amiodarone discontinued due to prolonged QTC echocardiogram ordered.LVEF 60%.  Normal function.  Mild left ventricular hypertrophy RV function mildly reduced 8/24: Still confused.  Still on Precedex drip.  NG tube removed per surgery.  Modified  barium swallow ordered.  Interim History / Subjective:   No distress.  Objective   Blood pressure (Abnormal) 159/74, pulse 82, temperature (Abnormal) 97.4 F (36.3 C), temperature source Axillary, resp. rate (Abnormal) 22, height 6' (1.829 m), weight 66.4 kg, SpO2 94 %.        Intake/Output Summary (Last 24 hours) at 06/27/2021 1047 Last data filed at 06/27/2021 0940 Gross per 24 hour  Intake 750.34 ml  Output 680 ml  Net 70.34 ml   Filed Weights   06/15/2021 0356 06/23/21 0456 06/24/21 0500  Weight: 68 kg 67.9 kg 66.4 kg    Examination: General: 75 year old male patient remains encephalopathic but will follow commands and is redirectable HEENT normocephalic atraumatic no jugular venous distention appreciated Pulmonary: Diminished throughout.  Some rhonchi that clear with cough, no wheezing but does have prolonged expiratory phase today Cardiac: Regular irregular with soft systolic murmur Abdomen: Soft, wound VAC in place.  Stoma pink, sounds quiet Extremities: Warm dry brisk capillary refill. GU external Foley catheter in place Neuro awake, confused, no distress.  Moves all extremities without focal deficits appreciated  Resolved Hospital Problem list   Hypovolemic shock  Post-operative respiratory failure ->extubated 8/22 Acute Kidney Injury (POA) resolved Assessment & Plan:   Acute Bowel Perforation w/ Pneumatosis; 2/2 sigmoid stricture/obstruction due to T4N1 obstructing rectosigmoid cancer . 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  - Two satellite tumor nodules. Plan  Has out-pt w/ONC follow up w/ plan to d/w tumor board  Has been seen by WO nurse for ostomy care SLP eval for MBS. Would like to avoid feeding tube if able given delirium   Chronic Hypoxic & hypercarbic respiratory failure 2/2 COPD w/ on-going tobacco abuse  Current every day smoker - 60ppd hx - present on admit Plan  Cont  Duoneb q 4 and pulmicort bid cont supplemental oxygen Cont to encourage HS BIPAP (has been refusing) IS  Mobilize   Acute metabolic encephalopathy -new onset 06/26/2021  Multifactorial and likely metabolic and toxic hypercapnia and other reasons Plan Cont precedex Delirium precautions Thiamine and folate   A. fib RVR 06/25/2021.  Prolonged QTC 511 ms-new 06/26/2021 06/26/2021-in sinus rhythm.  QTC 511 ms--> - DC'd amiodarone 8/23 -still having runs Afib  Plan Cont tele  Add back home lopressor Ensure K > 4 and Mg > 2 Repeat 12 lead today   HTN Plan Add norvasc back  Anemia of blood loss and critical illness - (POA) 10.6gm% -  sp 1u PRBC 8/19 -hgb slowly trending down but no active bleeding  Plan Transfuse for hgb < 7 Serial cbcs  Intermittent fluid and Electrolyte imbalance: hypophosphatemia, hypokalemia Plan Replace and recheck   Hx Prostate Cancer  stage 4 micrometastic - baseline prior to and on admit -  On Xtandi at home. Seen by Dr Tresa Moore 3 months prior to admit. Per Dr Tresa Moore - Per Under control PSA 0.64 - stage 4 micrometastatic (CT and bone scan negative for gross disease but Positive blood work) - Good prognosis Plan  Holding home xtandia OK to hold for couple weeks per onc  Await PS  History of chronic HLD Plan Resume lipitor in am if LFTs cont to improve  Hx CVA October 2021- on DAPT prior to admit Plan Holding asa and plavix.  Resume when ok w/ surg   Severe protein calorie Malnutrition w/ cachexia (POA)alb 2.2 oJune 2022, cachexia with copd, decreased po intake leading into admit) Plan Swallow eval  Failure to thrive as evidenced by multiple admissions and protein calorie malnutrition. Cachexia and Limited mobility and Physical deconditioning -prior to and present on admission Plan Prob needs SNF   Best Practice (right click and "Reselect all SmartList Selections" daily)  Diet/type: clear liquids and NPO DVT prophylaxis: prophylactic heparin   GI prophylaxis: PPI Lines: N/A - L IJ 8/19  Foley:  Placed8/19 - remove 8/22 Code Status:  full code  Last date of multidisciplinary goals of care discussion - by CCS for 06/25/21. Wife update 06/26/21  My cct 57 min  Erick Colace ACNP-BC [place] Pager # [phone] OR # [phone] if no answer

## 2021-06-27 NOTE — Progress Notes (Signed)
PT Cancellation Note  Patient Details Name: Daniel King MRN: [id number] DOB: 01/11/1946   Cancelled Treatment:    Reason Eval/Treat Not Completed: Medical issues which prohibited therapy, Increased BP, HR up into 140's. Will check back tomorrow.   Claretha Cooper 06/27/2021, 2:32 PM

## 2021-06-27 NOTE — Progress Notes (Addendum)
Subjective No acute change. Did not have swallow eval yesterday due to NG being to suction. Pt refusing bipap. Still with some delirium.   Objective: Vital signs in last 24 hours: Temp:  [96.3 F (35.7 C)-97.8 F (36.6 C)] 97.3 F (36.3 C) (08/23 2000) Pulse Rate:  [53-88] 60 (08/24 0700) Resp:  [14-25] 14 (08/24 0700) BP: (109-165)/(39-123) 165/59 (08/24 0700) SpO2:  [83 %-100 %] 100 % (08/24 0700) Last BM Date: 06/24/21  Intake/Output from previous day: 08/23 0701 - 08/24 0700 In: 1651.7 [I.V.:897; IV Piggyback:754.7] Out: 650 [Urine:650] Intake/Output this shift: No intake/output data recorded.  Gen: Somnolent this AM CV: RRR Pulm: unlabored, sat 99% Abd: Soft, nondistended, minimally tender. Midline wound VAC in place with serosanguineous output stoma viable, edema much improved, patent, with ostomy sweat but no real stool output yet Ext: SCDs in place  Lab Results: CBC  Recent Labs    06/26/21 0240 06/27/21 0610  WBC 11.5* 9.3  HGB 7.9* 8.2*  HCT 25.0* 25.7*  PLT 263 291   BMET Recent Labs    06/25/21 1854 06/26/21 0240  NA 137 139  K 2.8* 3.8  CL 98 99  CO2 30 31  GLUCOSE 120* 101*  BUN 20 16  CREATININE 0.88 0.76  CALCIUM 7.3* 7.5*   PT/INR No results for input(s): LABPROT, INR in the last 72 hours. ABG Recent Labs    06/26/21 0750  PHART 7.353  HCO3 30.8*    Studies/Results:  Anti-infectives: Anti-infectives (From admission, onward)    Start     Dose/Rate Route Frequency Ordered Stop   06/20/2021 1400  piperacillin-tazobactam (ZOSYN) IVPB 3.375 g        3.375 g 12.5 mL/hr over 240 Minutes Intravenous Every 8 hours 06/12/2021 1357 06/27/21 1414   06/09/2021 0745  cefoTEtan (CEFOTAN) 2 g in sodium chloride 0.9 % 100 mL IVPB        2 g 200 mL/hr over 30 Minutes Intravenous On call to O.R. 06/14/2021 0740 06/29/2021 0831   06/28/2021 0745  sodium chloride 0.9 % with cefoTEtan (CEFOTAN) ADS Med       Note to Pharmacy: Charmayne Sheer   : cabinet  override      06/27/2021 0745 06/11/2021 0854   06/11/2021 0700  piperacillin-tazobactam (ZOSYN) IVPB 3.375 g        3.375 g 100 mL/hr over 30 Minutes Intravenous  Once 06/05/2021 0653 06/23/2021 0733        Assessment/Plan: Patient Active Problem List   Diagnosis Date Noted   Perforated bowel ([place]) 06/26/2021   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[place]) 06/04/2021   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 07/02/2021 07/03/2021   History of stroke 06/16/2021   Supplemental oxygen dependent 06/06/2021   COPD (chronic obstructive pulmonary disease) ([place]) 06/16/2021   Malnutrition of moderate degree 06/21/2021   s/p Procedure(s): EXPLORATORY LAPAROTOMY ABDOMINAL COLECTOMY WITH END ILEOSTOMY 07/04/2021  -Appreciate CCMs assistance in his care - no further surgical plans.  Extubated 8/22.  Some delirium and still on Precedex this morning. -No pain meds ordered except prn robaxin- start scheduled tylenol, prn dilaudid/oxycodone -Hypokalemia- replace IV -Will remove NG as output has been quite minimal and abdomen nondistended. If not cleared for PO would placed cortrak and sart trickle tube feeds but would not advance, awaiting return of bowel fxn; appreciate assistance from [place] change today -Ppx: SCDs; PPI; SQH started 8/21 - I discussed the pathology results with his wife yesterday. T4N1 obstructing rectosigmoid cancer.  Dr. Johney Maine  has initiated arrangements for GI tumor board and outpatient oncology follow-up.   LOS: 5 days   [facility] Use [email or web address] to contact on call provider

## 2021-06-27 NOTE — Progress Notes (Signed)
Modified Barium Swallow Progress Note  Patient Details  Name: Daniel King MRN: [id number] Date of Birth: 1946/04/06  Today's Date: 06/27/2021  Modified Barium Swallow completed.  Full report located under Chart Review in the Imaging Section.  Brief recommendations include the following:  Clinical Impression  Pt given limited omnipaque *(thin consistency) and nectar barium during testing - did not provide solids or pudding barium.   Pt presents with deep laryngeal penetration with thin liquids via cup/straw consistently due to premature spillage into open airway. Cued cough did not fully clear penetrates.  No aspiration/penetration with thin via tsp, nectar via tsp, cup or straw nor significant retention.  Chin tuck posture tested to determine if prevents laryngeal penetration but pt unable to follow directions to complete adequately - ? due to cognition and/or coordination.  Pharyngeal swallow fortunately was strong without retention. Recommend nectar consistency via cup/straw and thin via tsp.  RN present and advised to recommendations.  When pt is appropriate for solid advancement, given premature spillage of liquids and pt's mild dysarthria, recommend dys3 diet.  Will follow up for po tolerance, dietary recommendations.   Swallow Evaluation Recommendations       SLP Diet Recommendations: Dysphagia 3 (Mech soft) solids;Nectar thick liquid;Thin liquid   Liquid Administration via: Cup;Straw (thin via tsp)       Supervision: Full assist for feeding   Compensations: Slow rate;Small sips/bites;Other (Comment) (cough and expectorate if voice gurgly or reflexively coughing)   Postural Changes: Remain semi-upright after after feeds/meals (Comment);Seated upright at 90 degrees   Oral Care Recommendations: Oral care BID   Other Recommendations: Have oral suction available  Daniel Lime, MS [facility] SLP Acute Rehab Services Office [phone] Pager [phone]   Daniel King 06/27/2021,1:00 PM

## 2021-06-27 NOTE — Evaluation (Signed)
Clinical/Bedside Swallow Evaluation Patient Details  Name: Daniel King MRN: [id number] Date of Birth: 27-Aug-1946  Today's Date: 06/27/2021 Time: SLP Start Time (ACUTE ONLY): 0935 SLP Stop Time (ACUTE ONLY): 1011 SLP Time Calculation (min) (ACUTE ONLY): 36 min  Past Medical History:  Past Medical History:  Diagnosis Date   COPD (chronic obstructive pulmonary disease) ([place])    Past Surgical History:  Past Surgical History:  Procedure Laterality Date   LAPAROTOMY N/A 06/17/2021   Procedure: EXPLORATORY LAPAROTOMY ABDOMINAL COLECTOMY WITH END ILEOSTOMY;  Surgeon: Michael Boston, MD;  Location: [facility] ORS;  Service: General;  Laterality: N/A;   HPI:  74 yo male adm to [facility] on perforated bowel, gangrene of colon from obstruction s/p abdominal colectomy/ileostomy 07/04/2021.  Pt required intubation and was extubated on 8/22.  CXR showed progresive opacities at lung bases on 8/23//22. 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  Pt with reverse lordosis of cervical spine.  Swallow eval ordered.  Pt has NG removed at this time.  Wife, [name], present and pt and wife deny pt having dysphagia PTA but report he did see an SLP at [facility] for swallowing following his cva.   Assessment / Plan / Recommendation Clinical Impression  Pt presents with clinical indications that may indicate acute on chronic dysphagia due to intubation c/b hoarse vocal quality, decreased cough strength and intermittent cough with intake of ice, thin and nectar thick liquids.  Fortunately, intake of ice likely facilitated pharyngeal/laryngeal clearance of secretions as pt able to cough and expel frothy white secretions x1. He however demonstrates recurrence of congestion after approx 4 ounces of nectar. He has hx significant for CVA of left corona radiata/basal ganglia October 2021 with tx at [facility].   Given pt's 4 day intubation, COPD and basal ganglia/corona  radiata CVA that may lend to silent aspiration risk, deconditioning  - recommend he have an instrumental swallow evaluation prior to po intake.  Can not rule out silent aspiration clinically. Pt following some directions, states off frequently during session and requires cues - not at baseline per his wife.  Discussed recommendation with wife and pt re: if MBS not able to be done, rec drop tube and have ice chips with goal of improved voice, respirations and swallowing. SLP Visit Diagnosis: Dysphagia, unspecified (R13.10);Dysphagia, oropharyngeal phase (R13.12)    Aspiration Risk   Moderate to severe   Diet Recommendation NPO;Ice chips PRN after oral care   Medication Administration: Via alternative means    Other  Recommendations Oral Care Recommendations: Oral care BID Other Recommendations: Have oral suction available   Follow up Recommendations Other (comment) (TBD)      Frequency and Duration min 2x/week  2 weeks       Prognosis Prognosis for Safe Diet Advancement: Fair Barriers to Reach Goals: Cognitive deficits      Swallow Study   General Date of Onset: 06/27/21 HPI: 75 yo male adm to [facility] on perforated bowel, gangrene of colon from obstruction s/p abdominal colectomy/ileostomy 06/08/2021.  Pt required intubation and was extubated on 8/22.  CXR showed progresive opacities at lung bases on 8/23//22. 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  Pt with reverse lordosis of cervical spine.  Swallow eval ordered.  Pt has NG removed at this time.  Wife, [name], present and pt and wife deny pt having dysphagia PTA but report he did see  an SLP at [facility] for swallowing following his cva. Diet Prior to this Study: NPO;Other (Comment) (ice chips) Temperature Spikes Noted: No Respiratory Status: Nasal cannula History of Recent Intubation: Yes Length of Intubations (days): 4 days Date extubated:  06/25/21 Behavior/Cognition: Alert;Other (Comment);Distractible;Requires cueing Oral Cavity Assessment: Within Functional Limits Oral Care Completed by SLP: No Oral Cavity - Dentition: Adequate natural dentition Vision: Functional for self-feeding Self-Feeding Abilities: Needs assist (due to impulsivity and edema of arms/ weakness) Patient Positioning: Upright in bed Baseline Vocal Quality: Suspected CN X (Vagus) involvement;Low vocal intensity;Hoarse Volitional Cough: Strong (productive at times with cueing for max effort)    Oral/Motor/Sensory Function Overall Oral Motor/Sensory Function: Mild impairment Facial ROM: Reduced right Facial Symmetry: Abnormal symmetry right (subtle) Facial Strength: Reduced right Facial Sensation: Within Functional Limits Lingual ROM: Other (Comment) (reduced mildly) Lingual Symmetry: Within Functional Limits Lingual Strength: Reduced Lingual Sensation: Reduced (pt reports numb on left side of tongue - appears with ? abrasion on subglottic area, ? due to ET) Velum: Within Functional Limits Mandible: Other (Comment) (dnt)   Ice Chips Ice chips: Impaired Presentation: Spoon Pharyngeal Phase Impairments: Cough - Delayed;Cough - Immediate Other Comments: productive and facilitating secretion clearance   Thin Liquid Thin Liquid: Impaired Presentation: Spoon Pharyngeal  Phase Impairments: Cough - Immediate    Nectar Thick Nectar Thick Liquid: Impaired Presentation: Cup;Spoon Pharyngeal Phase Impairments: Cough - Delayed   Honey Thick Honey Thick Liquid: Not tested   Puree Puree: Not tested   Solid     Solid: Not tested      Macario Golds 06/27/2021,11:02 AM [name], MS [place] Office [phone] Pager [phone]

## 2021-06-27 NOTE — Progress Notes (Signed)
Patient now compliant with BiPAP. Placed on per BiPAP flowsheet documentation.

## 2021-06-27 NOTE — Evaluation (Addendum)
Occupational Therapy Evaluation Patient Details Name: Daniel King MRN: [id number] DOB: 1946-05-27 Today's Date: 06/27/2021    History of Present Illness Patient is a 75 year old male who presented with two day history of abdomenal pain and discomfort. patient's CT was concerning for preforation. 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 patient was also found to have septic shock, acute respiratory failure and blood loss anemia. patient was extubated on 8/22.   PMH: COPD, A fib,h/o CVA   Clinical Impression   Patient is a 75 year old male who was admitted for above. Patient was living at home with wife prior level. Currently, patient is TD for mobility in bed.evaluation was limited due to patients blood pressure and HR increasing out of functional ranges with nurse in room.  Patient is noted to have decreased strength, decreased activity tolerance, decreased endurance, decreased memory, decreased balance impacting ability to engage in ADL tasks. Patient would continue to benefit from skilled OT services at this time while admitted and after d/c to address noted deficits in order to improve overall safety and independence in ADLs.      Follow Up Recommendations  SNF    Equipment Recommendations  None recommended by OT (per wife patient has all needed items at home)    Recommendations for Other Services       Precautions / Restrictions Precautions Precautions: Fall Precaution Comments: abdominal wound VAC, ileostomy, monitor BP and HR Restrictions Weight Bearing Restrictions: No      Mobility Bed Mobility Overal bed mobility: Needs Assistance Bed Mobility: Rolling Rolling: Total assist;+2 for physical assistance         General bed mobility comments: patient was noted to have increased difficulty with lifting BLE off the bed.    Transfers                 General transfer comment: further mobility  was stopped with BP up to 225/94 mmhg in bed.    Balance                                           ADL either performed or assessed with clinical judgement   ADL Overall ADL's : Needs assistance/impaired Eating/Feeding: Maximal assistance;Bed level Eating/Feeding Details (indicate cue type and reason): patient currently has swallowing restrictions with wife observed giving patient ice chips. Grooming: Bed level;Maximal assistance;Wash/dry face   Upper Body Bathing: Maximal assistance;Bed level   Lower Body Bathing: Total assistance;Bed level   Upper Body Dressing : Maximal assistance;Bed level   Lower Body Dressing: Total assistance;Bed level   Toilet Transfer: +2 for safety/equipment;+2 for physical assistance;Total assistance   Toileting- Clothing Manipulation and Hygiene: Total assistance;Bed level       Functional mobility during ADLs: +2 for safety/equipment;+2 for physical assistance;Total assistance                           Pertinent Vitals/Pain Pain Assessment: Faces Faces Pain Scale: Hurts little more Pain Location: R forearm near IV site Pain Descriptors / Indicators: Grimacing Pain Intervention(s): Other (comment) (nurse made aware)     Hand Dominance     Extremity/Trunk Assessment Upper Extremity Assessment Upper Extremity Assessment: RUE deficits/detail;LUE deficits/detail RUE Deficits / Details: noted to have edema near bilateral elbows mostly posterior LUE Deficits / Details: noted to have edema  near elbows.   Lower Extremity Assessment Lower Extremity Assessment: Defer to PT evaluation   Cervical / Trunk Assessment Cervical / Trunk Assessment: Kyphotic   Communication     Cognition Arousal/Alertness: Awake/alert   Overall Cognitive Status: Impaired/Different from baseline Area of Impairment: Memory;Attention;Safety/judgement;Following commands                   Current Attention Level: Selective Memory:  Decreased short-term memory Following Commands: Follows one step commands inconsistently Safety/Judgement: Decreased awareness of safety     General Comments: patients wife reported patient was more confused. patients chart noted that patient has been having hallucinations.   General Comments  patients HR was noted to spike up to 140 bpm with no warning or movement then trending back down to 100-117. patients blood pressure was noted to be 199/96 mmg sitting up in bed. patient had hard time following commands to keep LUE still for BP to complete.session was stopped and nurse made aware of readings.               Home Living Family/patient expects to be discharged to:: Private residence Living Arrangements: Spouse/significant other Available Help at Discharge: Family;Available 24 hours/day Type of Home: House Home Access: Stairs to enter [employer] of Steps: 7 Entrance Stairs-Rails: Right;Left;Can reach both Home Layout: Two level;Able to live on main level with bedroom/bathroom     Bathroom Shower/Tub: Tub/shower unit   Bathroom Toilet: Standard Bathroom Accessibility: No   Home Equipment: Environmental consultant - 2 wheels;Bedside commode;Shower seat;Wheelchair - manual;Hospital bed          Prior Functioning/Environment Level of Independence: Needs assistance  Gait / Transfers Assistance Needed: mostly bed - BSC-wc; was able to navigate steps x 1 since being home in June; wife states it was becoming more difficult to manage him at home ADL's / Homemaking Assistance Needed: wife assisted with ADL tasks and mobility            OT Problem List: Decreased range of motion;Decreased coordination;Decreased knowledge of use of DME or AE;Decreased strength;Decreased activity tolerance;Decreased safety awareness;Impaired balance (sitting and/or standing);Decreased cognition;Impaired UE functional use;Pain;Cardiopulmonary status limiting activity;Increased edema      OT  Treatment/Interventions: Self-care/ADL training;Therapeutic exercise;DME and/or AE instruction;Therapeutic activities;Balance training;Patient/family education    OT Goals(Current goals can be found in the care plan section) Acute Rehab OT Goals Patient Stated Goal: none stated OT Goal Formulation: With patient/family Time For Goal Achievement: 07/11/21 Potential to Achieve Goals: Fair  OT Frequency: Min 2X/week   Barriers to D/C:    patient lives at home with wife who is not able to offer current level of physical assistance                     AM-PAC OT "6 Clicks" Daily Activity     Outcome Measure Help from another person eating meals?: A Lot Help from another person taking care of personal grooming?: A Lot Help from another person toileting, which includes using toliet, bedpan, or urinal?: Total Help from another person bathing (including washing, rinsing, drying)?: Total Help from another person to put on and taking off regular upper body clothing?: Total Help from another person to put on and taking off regular lower body clothing?: Total 6 Click Score: 8   End of Session Nurse Communication: Other (comment) (patients abnormal vitals and pain in R forarm)  Activity Tolerance: Treatment limited secondary to medical complications (Comment) (HR and BP with nurse providing medications) Patient left:  Time: [id number] OT Time Calculation (min): 36 min Charges:  OT General Charges $OT Visit: 1 Visit OT Evaluation $OT Eval Moderate Complexity: 1 Mod  Jackelyn Poling OTR/L, MS Acute Rehabilitation Department Office# [phone] Pager# [phone]   [place] 06/27/2021, 3:52 PM

## 2021-06-27 NOTE — Progress Notes (Signed)
Nutrition Follow-up  DOCUMENTATION CODES:   Non-severe (moderate) malnutrition in context of chronic illness  INTERVENTION:  - diet advancement as medically feasible. - if patient fails MBS, recommend small bore NGT and initiation of TF. - Surgeon's note states that if this is needed, trickle rate only until bowel function returns.  - recommended goal regimen for TF, if warranted: Osmolite 1.5 @ 55 ml/hr with 45 ml Prosource TF TID. - this regimen will provide 2100 kcal, 116 grams protein, and 1006 ml free water.    NUTRITION DIAGNOSIS:   Moderate Malnutrition related to chronic illness (COPD) as evidenced by mild fat depletion, mild muscle depletion, moderate muscle depletion. -ongoing  GOAL:   Patient will meet greater than or equal to 90% of their needs -unable to meet   MONITOR:   Diet advancement, Labs, Weight trends, Skin  ASSESSMENT:   75 year-old male with medical history of COPD. He presented to the ED due to worsening abdominal pain. CT showed diffuse dilated colon with pneumatosis consistent with colon obstruction with ischemic perforation.  Significant Events: 8/19- admission; intubation; NGT placement; ex lap with colectomy and end ileostomy 8/22- extubation   Visualized patient laying in bed. NGT is now out as of earlier this AM. He remains NPO. Patient noted to be a/o to self only.    Able to talk with SLP concerning patient. Plan for MBS early afternoon today. If patient does not pass, will need small bore NGT placement and initiation of TF; TF recommendations outlined above.   He has not been weighed since 8/21 at which time weight was -4 lb compared to weight on 8/19. Non-pitting edema to BUE documented in the edema section of flow sheet. He is noted to have had 650 ml urine output yesterday. He is noted to be +6.75 L since admission.    Labs reviewed; CBGs: 94 and 90 mg/dl, K: 3.1 mmol/l, Cl: 97 mmol/l, Ca: 7.5 mg/dl, Phos: 2.1 mg/dl, LFTs elevated but  down from 8/23.  Medications reviewed; sliding scale novolog, 1 mg IV folic acid/day, 20 mg IV lasix x1 dose 8/23, 2 g IV Mg sulfate x1 run 8/23, 40 mg IV protonix/day, 10 mEq IV KCl x4 runs 8/24, 20 mmol IV KPhos x1 run 8/24, 30 mmol IV NaPhos x1 run 8/23, 100 mg IV thimaine/day.   IVF; D5-LR @ 50 ml/hr (204 kcal/24 hrs).   Diet Order:   Diet Order             Diet NPO time specified  Diet effective now                   EDUCATION NEEDS:   No education needs have been identified at this time  Skin:  Skin Assessment: Skin Integrity Issues: Skin Integrity Issues:: DTI, Incisions DTI: coccyx (newly documented 8/22) Incisions: abdomen (8/19)  Last BM:  8/21 (type 7 via ileostomy)  Height:   Ht Readings from Last 1 Encounters:  06/09/2021 6' (1.829 m)    Weight:   Wt Readings from Last 1 Encounters:  06/24/21 66.4 kg      Estimated Nutritional Needs:  Kcal:  1995-2190 kcal Protein:  105-120 grams Fluid:  >/= 2 L/day     Daniel Matin, MS, RD, LDN, CNSC Inpatient Clinical Dietitian RD pager # available in [place]  After hours/weekend pager # available in [facility]

## 2021-06-28 DIAGNOSIS — Z8673 Personal history of transient ischemic attack (TIA), and cerebral infarction without residual deficits: Secondary | ICD-10-CM | POA: Diagnosis not present

## 2021-06-28 DIAGNOSIS — J449 Chronic obstructive pulmonary disease, unspecified: Secondary | ICD-10-CM | POA: Diagnosis not present

## 2021-06-28 DIAGNOSIS — K55049 Acute infarction of large intestine, extent unspecified: Secondary | ICD-10-CM | POA: Diagnosis not present

## 2021-06-28 DIAGNOSIS — C19 Malignant neoplasm of rectosigmoid junction: Secondary | ICD-10-CM | POA: Diagnosis present

## 2021-06-28 DIAGNOSIS — J9601 Acute respiratory failure with hypoxia: Secondary | ICD-10-CM | POA: Diagnosis not present

## 2021-06-28 LAB — BASIC METABOLIC PANEL
Anion gap: 9 (ref 5–15)
BUN: 7 mg/dL — ABNORMAL LOW (ref 8–23)
CO2: 33 mmol/L — ABNORMAL HIGH (ref 22–32)
Calcium: 7.7 mg/dL — ABNORMAL LOW (ref 8.9–10.3)
Chloride: 95 mmol/L — ABNORMAL LOW (ref 98–111)
Creatinine, Ser: 0.55 mg/dL — ABNORMAL LOW (ref 0.61–1.24)
GFR, Estimated: >60 mL/min (ref >60)
Glucose, Bld: 102 mg/dL — ABNORMAL HIGH (ref 70–99)
Potassium: 3.2 mmol/L — ABNORMAL LOW (ref 3.5–5.1)
Sodium: 137 mmol/L (ref 135–145)

## 2021-06-28 LAB — CBC
HCT: 28.9 % — ABNORMAL LOW (ref 39.0–52.0)
Hemoglobin: 8.9 g/dL — ABNORMAL LOW (ref 13.0–17.0)
MCH: 27.3 pg (ref 26.0–34.0)
MCHC: 30.8 g/dL (ref 30.0–36.0)
MCV: 88.7 fL (ref 80.0–100.0)
Platelets: 338 10*3/uL (ref 150–400)
RBC: 3.26 MIL/uL — ABNORMAL LOW (ref 4.22–5.81)
RDW: 17.2 % — ABNORMAL HIGH (ref 11.5–15.5)
WBC: 10.2 10*3/uL (ref 4.0–10.5)
nRBC: 1.9 % — ABNORMAL HIGH (ref 0.0–0.2)

## 2021-06-28 LAB — PHOSPHORUS: Phosphorus: 1.8 mg/dL — ABNORMAL LOW (ref 2.5–4.6)

## 2021-06-28 LAB — GLUCOSE, CAPILLARY
Glucose-Capillary: 101 mg/dL — ABNORMAL HIGH (ref 70–99)
Glucose-Capillary: 113 mg/dL — ABNORMAL HIGH (ref 70–99)
Glucose-Capillary: 114 mg/dL — ABNORMAL HIGH (ref 70–99)
Glucose-Capillary: 114 mg/dL — ABNORMAL HIGH (ref 70–99)
Glucose-Capillary: 121 mg/dL — ABNORMAL HIGH (ref 70–99)
Glucose-Capillary: 136 mg/dL — ABNORMAL HIGH (ref 70–99)

## 2021-06-28 LAB — MAGNESIUM: Magnesium: 2.2 mg/dL (ref 1.7–2.4)

## 2021-06-28 MED ORDER — FOLIC ACID 1 MG PO TABS
1.0000 mg | ORAL_TABLET | Freq: Every day | ORAL | Status: DC
  Administered 2021-06-28 – 2021-07-07 (×10): 1 mg via ORAL
  Filled 2021-06-28 (×11): qty 1

## 2021-06-28 MED ORDER — PANTOPRAZOLE SODIUM 40 MG PO TBEC
40.0000 mg | DELAYED_RELEASE_TABLET | Freq: Every day | ORAL | Status: DC
  Administered 2021-06-28 – 2021-07-07 (×10): 40 mg via ORAL
  Filled 2021-06-28 (×11): qty 1

## 2021-06-28 MED ORDER — AMLODIPINE BESYLATE 5 MG PO TABS
5.0000 mg | ORAL_TABLET | Freq: Once | ORAL | Status: AC
  Administered 2021-06-28: 5 mg via ORAL
  Filled 2021-06-28: qty 1

## 2021-06-28 MED ORDER — HYDRALAZINE HCL 20 MG/ML IJ SOLN
20.0000 mg | INTRAMUSCULAR | Status: DC | PRN
  Administered 2021-07-03 – 2021-07-04 (×2): 20 mg via INTRAVENOUS
  Filled 2021-06-28 (×4): qty 1

## 2021-06-28 MED ORDER — THIAMINE HCL 100 MG PO TABS
100.0000 mg | ORAL_TABLET | Freq: Every day | ORAL | Status: DC
  Administered 2021-06-28 – 2021-07-07 (×10): 100 mg via ORAL
  Filled 2021-06-28 (×11): qty 1

## 2021-06-28 MED ORDER — LISINOPRIL 10 MG PO TABS
20.0000 mg | ORAL_TABLET | Freq: Every day | ORAL | Status: DC
  Administered 2021-06-28: 20 mg via ORAL
  Filled 2021-06-28: qty 2

## 2021-06-28 MED ORDER — AMLODIPINE BESYLATE 10 MG PO TABS
10.0000 mg | ORAL_TABLET | Freq: Every day | ORAL | Status: DC
  Administered 2021-06-29 – 2021-07-07 (×7): 10 mg via ORAL
  Filled 2021-06-28 (×9): qty 1

## 2021-06-28 MED ORDER — METOPROLOL TARTRATE 25 MG PO TABS: 25.0000 mg | ORAL_TABLET | Freq: Two times a day (BID) | ORAL | Status: DC

## 2021-06-28 MED ORDER — ASPIRIN EC 81 MG PO TBEC
81.0000 mg | DELAYED_RELEASE_TABLET | Freq: Every day | ORAL | Status: DC
  Administered 2021-06-28 – 2021-07-07 (×10): 81 mg via ORAL
  Filled 2021-06-28 (×11): qty 1

## 2021-06-28 MED ORDER — POTASSIUM CHLORIDE 10 MEQ/100ML IV SOLN
10.0000 meq | INTRAVENOUS | Status: AC
  Administered 2021-06-28 (×4): 10 meq via INTRAVENOUS
  Filled 2021-06-28 (×4): qty 100

## 2021-06-28 MED ORDER — HYDRALAZINE HCL 20 MG/ML IJ SOLN
20.0000 mg | Freq: Once | INTRAMUSCULAR | Status: AC
  Administered 2021-06-28: 20 mg via INTRAVENOUS

## 2021-06-28 MED ORDER — METOPROLOL TARTRATE 12.5 MG HALF TABLET
12.5000 mg | ORAL_TABLET | Freq: Two times a day (BID) | ORAL | Status: DC
  Administered 2021-06-28 – 2021-07-04 (×11): 12.5 mg via ORAL
  Filled 2021-06-28 (×12): qty 1

## 2021-06-28 MED ORDER — POTASSIUM PHOSPHATES 15 MMOLE/5ML IV SOLN
10.0000 mmol | Freq: Once | INTRAVENOUS | Status: AC
  Administered 2021-06-28: 10 mmol via INTRAVENOUS
  Filled 2021-06-28: qty 3.33

## 2021-06-28 MED ORDER — CLOPIDOGREL BISULFATE 75 MG PO TABS
75.0000 mg | ORAL_TABLET | Freq: Every day | ORAL | Status: DC
  Administered 2021-06-28 – 2021-07-07 (×10): 75 mg via ORAL
  Filled 2021-06-28 (×11): qty 1

## 2021-06-28 NOTE — Progress Notes (Addendum)
Subjective Much more alert and coherent this morning.  On BiPAP.  Tolerated liquids yesterday without issue.  Objective: Vital signs in last 24 hours: Temp:  [97 F (36.1 C)-97.8 F (36.6 C)] 97 F (36.1 C) (08/25 0000) Pulse Rate:  [62-117] 73 (08/25 0600) Resp:  [17-28] 19 (08/25 0600) BP: (111-225)/(50-108) 183/80 (08/25 0600) SpO2:  [87 %-100 %] 99 % (08/25 0600) Last BM Date: 06/24/21  Intake/Output from previous day: 08/24 0701 - 08/25 0700 In: 2004.2 [P.O.:360; I.V.:915.7; IV Piggyback:728.5] Out: 1180 [Urine:1150; Stool:30] Intake/Output this shift: No intake/output data recorded.  Gen: Somnolent this AM CV: RRR Pulm: unlabored, sat 99% Abd: Soft, nondistended, minimally tender. Midline wound VAC in place with serosanguineous output stoma viable, edema much improved, patent, about 200 cc of liquid stool in the bag Ext: SCDs in place  Lab Results: CBC  Recent Labs    06/27/21 0610 06/28/21 0244  WBC 9.3 10.2  HGB 8.2* 8.9*  HCT 25.7* 28.9*  PLT 291 338    BMET Recent Labs    06/27/21 0610 06/28/21 0244  NA 139 137  K 3.1* 3.2*  CL 97* 95*  CO2 29 33*  GLUCOSE 87 102*  BUN 11 7*  CREATININE 0.67 0.55*  CALCIUM 7.5* 7.7*    PT/INR No results for input(s): LABPROT, INR in the last 72 hours. ABG Recent Labs    06/26/21 0750  PHART 7.353  HCO3 30.8*     Studies/Results:  Anti-infectives: Anti-infectives (From admission, onward)    Start     Dose/Rate Route Frequency Ordered Stop   06/27/2021 1400  piperacillin-tazobactam (ZOSYN) IVPB 3.375 g        3.375 g 12.5 mL/hr over 240 Minutes Intravenous Every 8 hours 06/16/2021 1357 06/27/21 1016   06/23/2021 0745  cefoTEtan (CEFOTAN) 2 g in sodium chloride 0.9 % 100 mL IVPB        2 g 200 mL/hr over 30 Minutes Intravenous On call to O.R. 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  Extubated 8/22.  Some delirium and still on Precedex this morning. -Multimodal pain control -zosyn 5d course ended 8/24 -Hypokalemia- replace IV -Can try full liquids today, please see SLP recommendations.; appreciate assistance from WOCN -Ppx: SCDs; PPI; SQH started 8/21 - I discussed the pathology results with his wife later this week.. T4N1 obstructing rectosigmoid cancer.  Dr. Johney Maine has initiated arrangements for GI tumor board and outpatient oncology follow-up.   LOS: 6 days   Clovis Riley MD FACS  [facility] Use [email or web address] to contact on call provider

## 2021-06-28 NOTE — Progress Notes (Signed)
PT Cancellation Note  Patient Details Name: Daniel King MRN: [id number] DOB: [birth year]   Cancelled Treatment:     Checked on pt, pt refused stating he was too fatigue. Will try in the a.m to see if we can see pt before he is fatigued in the afternoon   Kenry Daubert, [facility] 06/28/2021, 5:04 PM

## 2021-06-28 NOTE — Progress Notes (Addendum)
NAME:  Daniel King, MRN:  [id number], DOB:  [birth year], LOS: 6 ADMISSION DATE:  06/06/2021, CONSULTATION DATE:  8/19 REFERRING MD:  Dr. Johney Maine, CHIEF COMPLAINT:  Post-operative respiratory failure    bRIEF - also MRN duplicate is MRN:  [id number]  75 y/o M who presented to [facility] on 8/19 with reports of 24 hours of abdominal pain that worsened the am of presentation.   He reported his last BM on 8/18, prior to that was one week ago.  On presentation he was note to have abdominal distention, tenderness.  CT of the abdomen was assessed with concerns for primary colon cancer in the mid sigmoid colon complicated by colonic obstruction with pneumatosis involving the cecum and ascending colon, consistent with ischemic colitis. Evidence of bowel perforation with small volume pneumoperitoneum.  He was evaluated by CCS and recommended for open colectomy / ileostomy.  He was on plavix prior to admit.  He was started on IV zosyn.  Intraoperative findings notable for massively dilated colon, cecum and ascending colon with ischemia and patches of phlegmon & gangrene with approximately 3 feet of colon removed, small volume pneumoperitoneum, rocky hard rectosigmoid mass worrisome for malignancy.  EBL estimated at 160m.  He had intraoperative hypotension requiring vasopressors.  The patient was returned to the ICU post surgery on mechanical ventilation. Post op Hgb 7.6.  The patient was given 1 unit PRBC.    PCCM consulted for assistance with ICU care.    Pertinent  Medical History  COPD  -  [place]. CGwenette Greet.  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  THC use [place] abusle - quit summer 2022 AAA: Previous vascular interventions: EVAR 10/04/2016 by Dr.  VeTheodis Satowith relining of graft and extension of right limb due to type I endoleak. I&D of right groin for infection with wound vac placement 11/07/2016.   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   in other chart with MRN [id number] has hypercapnia - [facility] 05/10/21 and reported wearing trilogy 2h  per night at home   Significant Hospital Events: Including procedures, antibiotic start and stop dates in addition to other pertinent events   8/19 admit with abd pain, CT with concern for perforation to OR for ex-lap with abdominal colectomy, colostomy. Returned to ICU on vent.  MRSA PCR positive COVID PCR negative Flu PCR negative Left IJ 8/19  Foley 8/19 > 8/22 ETT 8/19  >>8/22 06/24/21 - NAEON. UOP a bit better, still relatively oliguric. Cr stable. Alert, on SBT. Anticipate extubation. 06/25/21 : Failed SBT yesterday.  Lasix given.  Yesterday afebrile.  White count down to 11 point 9K.  Remains on ventilator 40% FiO2.  On fentanyl infusion, Precedex infusion, Levophed infusion off.  Only on vasopressin infusion.  Getting magnesium sulfate and potassium.  Remains on Zosyn.  Surgery noted that his oliguria is improving and creatinine normalized and to anticipate third space losses given colectomy -extubated 8/23 remains extubated. Hypercapnic. Did not do much BiPAP due to NG tube. On amio gtt and rate controlled . This AM - agitated and confused  and hallucinating despite precedex gtt. Wheezing. Chart review shows severe Protein cal malnutn pre admit alb 2.,2. EKG now with NSR - QTc 511 mosec. On precedex, amio gtt, comparize prn.  Amiodarone discontinued due to prolonged QTC echocardiogram ordered.LVEF 60%.  Normal function.  Mild left ventricular hypertrophy RV function mildly reduced 8/24: Still confused.  Still on Precedex drip.  NG tube removed per surgery.  Modified  barium swallow ordered. 8/25 Precedex stopped.  No longer confused.  Slowly advancing diet.  Barium swallow did demonstrate mild dysphagia.  Recommendations made by SLP.  Fairly hypertensive, Norvasc increased, Lopressor increased, added back ACE inhibitor.  Given as needed hydralazine.  No longer needs critical care we will ask medicine to continue to provide support  Interim History / Subjective:  No distress.  Is hypertensive. Objective   Blood pressure (Abnormal) 149/37, pulse 83, temperature 97.8 F (36.6 C), temperature source Axillary, resp. rate (Abnormal) 25, height 6' (1.829 m), weight 66.4 kg, SpO2 98 %.    FiO2 (%):  [36 %] 36 %   Intake/Output Summary (Last 24 hours) at 06/28/2021 1029 Last data filed at 06/28/2021 1025 Gross per 24 hour  Intake 2683.03 ml  Output 1150 ml  Net 1533.03 ml   Filed Weights   06/09/2021 0356 06/23/21 0456 06/24/21 0500  Weight: 68 kg 67.9 kg 66.4 kg    Examination: General 75 year old male patient resting in bed he is in no acute distress this morning HEENT normocephalic atraumatic no jugular venous distention Pulmonary: Some prolonged expiratory wheezing, improved.  Fine basilar rhonchi Cardiac: Regular rate and rhythm Abdomen: Soft nontender no organomegaly, wound VAC intact, ostomy unremarkable GU: Clear yellow Neuro: Awake, oriented today, appropriate today, moves all extremities. Extremities: Warm dry  Resolved Hospital Problem list   Hypovolemic shock  Post-operative respiratory failure ->extubated 8/22 Acute Kidney Injury (POA) resolved Assessment & Plan:   Acute Bowel Perforation w/ Pneumatosis; 2/2 sigmoid stricture/obstruction due to T4N1 obstructing rectosigmoid cancer . 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  - Two satellite tumor nodules. Plan  Has outpatient follow-up with oncology and plan to discuss further on tumor board Wound/ostomy care as  directed by ostomy team Maximize nutritional support   Chronic Hypoxic & hypercarbic respiratory failure 2/2 COPD w/ on-going tobacco abuse  Current every day smoker - 60ppd hx - present on admit Plan  Continue DuoNeb every 4 and twice daily Pulmicort Supplemental oxygen At bedtime BiPAP if he will do it Spirometry Mobilize   Acute metabolic encephalopathy -new onset 06/26/2021  Multifactorial and likely metabolic and toxic hypercapnia and other reasons Plan Continue delirium precautions Continue thiamine and folate DC Precedex   A. fib RVR 06/25/2021.  Prolonged QTC 511 ms-new 06/26/2021 06/26/2021-in sinus rhythm.  QTC 511 ms 8/25 EKG - NSR and QTc improved to 486 msec (MD Note) - DC'd amiodarone 8/23 -still having runs Afib  Plan Continue telemetry Continue Lopressor Ensure potassium greater than 4 magnesium greater than 2 Will eventually need to decide on chronic anticoagulation  HTN Plan Increased Norvasc to 10 mg,  increase Lopressor to 25 - MD note - will reduce back because BP got soft and patient has copd Added back lisinopril - MD note - will dc due to copd and wheeze risk As needed hydralazine  Anemia of blood loss and critical illness - (POA) 10.6gm% -  sp 1u PRBC 8/19 Hemoglobin stable Plan Serial CBCs Transfuse for hemoglobin less  than 7  Intermittent fluid and Electrolyte imbalance: hypophosphatemia, hypokalemia Plan Replace and recheck  Hx Prostate Cancer  stage 4 micrometastic - baseline prior to and on admit -  On Xtandi at home. Seen by Dr Tresa Moore 3 months prior to admit. Per Dr Tresa Moore - Per Under control PSA 0.64 - stage 4 micrometastatic (CT and bone scan negative for gross disease but Positive blood work) - Good prognosis Plan Holding xtandia, discussed with urology, this could be held up to 2 weeks if needed  Hx of bladder cancer in 2006 - early per patient on 06/28/2021 (MD add)  Plan  - monitor  History of chronic HLD Plan Repeat LFTs in  a.m., can probably resume statin after that  Hx CVA October 2021- on DAPT prior to admit  Plan aspirin and Plavix, okay to resume  by surgery  Severe protein calorie Malnutrition w/ cachexia (POA)alb 2.2 oJune 2022, cachexia with copd, decreased po intake leading into admit)  Plan Advance diet as allowed by surgery  Dysphagia, identified by modified barium swallow on 8/24 Plan Dysphagia 3 mechanical soft diet, nectar thick liquids, with thin liquids via teaspoon  Failure to thrive as evidenced by multiple admissions and protein calorie malnutrition. Cachexia and Limited mobility and Physical deconditioning -prior to and present on admission Plan May need skilled nursing facility  Best Practice (right click and "Reselect all SmartList Selections" daily)  Diet/type: clear liquids and NPO DVT prophylaxis: prophylactic heparin  GI prophylaxis: PPI Lines: N/A - L IJ 8/19  Foley:  Placed8/19 - remove 8/22 Code Status:  full code  Last date of multidisciplinary goals of care discussion - by CCS for 06/25/21. Wife update 06/26/21   Erick Colace ACNP-BC [place] Pager # [phone] OR # [phone] if no answer [phone]xxxxxx   ATTESTATION & SIGNATURE   STAFF NOTE: I, Dr Ann Lions have personally reviewed patient's available data, including medical history, events of note, physical examination and test results as part of my evaluation. I have discussed with resident/NP and other care providers such as pharmacist, RN and RRT.  In addition,  I personally evaluated patient and elicited key findings of   S: Date of admit 06/06/2021 with LOS 6 for today 06/28/2021 : Daniel King is  - off precedex. Had high BP afte that. BP meds increased and readded and now BP softer. Noted that lopressor increased (b1 blockade, has copd) and ACe inhitibot restarted (has copd)  Wife says he is not fully aware of his cancer diagnosis. I asked him and he said  he did not know results of his surgery. Explained to him he has colon CA with nodal mets. He processed news and verbgalized understanding. Per CCS - case ti be discussed in tumor board  Wife reports talking to palliative care in June 2022  Did use BiPAP last night  O:  Blood pressure (!) 186/92, pulse 84, temperature 97.6 F (36.4 C), temperature source Axillary, resp. rate (!) 27, height 6' (1.829 m), weight 66.4 kg, SpO2 99 %.   Frail Cahcexia Deconditgioned Barrell chest  Purse lip breathing Normal heart sounds    LABS    PULMONARY Recent Labs  Lab 06/05/2021 1130 06/26/21 0750  PHART 7.358 7.353  PCO2ART 44.7 56.8*  PO2ART 148* 182*  HCO3 24.5 30.8*  O2SAT 99.2 100.0    CBC Recent Labs  Lab 06/26/21 0240 06/27/21 0610 06/28/21 0244  HGB 7.9* 8.2* 8.9*  HCT 25.0* 25.7* 28.9*  WBC 11.5* 9.3 10.2  PLT 263 291 338    COAGULATION No results for input(s): INR in the last 168 hours.  CARDIAC  No results for input(s): TROPONINI in the last 168 hours. No results for input(s): PROBNP in the last 168 hours.   CHEMISTRY Recent Labs  Lab 06/23/21 0320 06/24/21 0744 06/25/21 0325 06/25/21 1854 06/26/21 0240 06/27/21 0610 06/28/21 0244  NA 131*   < > 135 137 139 139 137  K 3.8   < > 3.0* 2.8* 3.8 3.1* 3.2*  CL 101   < > 99 98 99 97* 95*  CO2 25   < > '28 30 31 29 '$ 33*  GLUCOSE 162*   < > 131* 120* 101* 87 102*  BUN 33*   < > 26* '20 16 11 '$ 7*  CREATININE 1.22   < > 0.97 0.88 0.76 0.67 0.55*  CALCIUM 7.2*   < > 7.3* 7.3* 7.5* 7.5* 7.7*  MG 1.7  --  1.9  --  1.9 1.9 2.2  PHOS  --   --   --   --  1.4* 2.1* 1.8*   < > = values in this interval not displayed.   Estimated Creatinine Clearance: 74.9 mL/min (A) (by C-G formula based on SCr of 0.55 mg/dL (L)).   LIVER Recent Labs  Lab 06/17/2021 0406 06/23/21 0320 06/26/21 0240 06/27/21 0610  AST 10* 64* 81* 42*  ALT 7 38 114* 84*  ALKPHOS 88 59 68 72  BILITOT 0.7 1.1 0.8 1.3*  PROT 6.7 4.5* 5.0* 5.1*   ALBUMIN 3.1* 2.2* 2.2* 2.2*     INFECTIOUS Recent Labs  Lab 06/09/2021 0406  LATICACIDVEN 1.1     ENDOCRINE CBG (last 3)  Recent Labs    06/28/21 0407 06/28/21 0756 06/28/21 1225  GLUCAP 101* 113* 114*         IMAGING x24h  - image(s) personally visualized  -   highlighted in bold No results found.    A: Acute encephalophalty - resolved., Off precedex Advanced copd with chronic hypoxemic/hypercapnic resp failure - needs bipap qhs (wearing it) and BD Colon CA - needs opd chemio (news givien) Hypertension - on many meds Stroke - DAPT at home Transaminiti - better  Overall frail, FTT, severe PCM and o2 dependence and physical deconditioning with ECOG 3-4  P:  BP: dc ace inhibitor due to copd, reduce lopressor due to copd and monitor with increased norvasc  Stroke - restart DAPT (ok oper Dr Kae Heller) COPD - need BiPAP Qhs and BD - change biPAP to dream station Cancer  and ECOG 3-4 - - get palliative care to start goals of care - not sure how much chemo he can handle  - ok per CCS - no need for intpatinet onc. Case to be discussed at tumor board 07/04/21 per CCS  CCM will r 2 x /week. [facility] to do medical consultation from 06/29/21  - d/w CCS   Move to progressive - ok per ccs - if still in 2w in 24h can even move to med surg with dream station 06/29/21    Anti-infectives (From admission, onward)    Start     Dose/Rate Route Frequency Ordered Stop   06/15/2021 1400  piperacillin-tazobactam (ZOSYN) IVPB 3.375 g        3.375 g 12.5 mL/hr over 240 Minutes Intravenous Every 8 hours 06/28/2021 1357 06/27/21 1016   07/01/2021 0745  cefoTEtan (CEFOTAN) 2 g in sodium chloride 0.9 % 100 mL IVPB  2 g 200 mL/hr over 30 Minutes Intravenous On call to O.R. 06/21/2021 0740 06/28/2021 0831   07/03/2021 0745  sodium chloride 0.9 % with cefoTEtan (CEFOTAN) ADS Med       Note to Pharmacy: Charmayne Sheer   : cabinet override      06/13/2021 0745 06/06/2021 0854   06/07/2021 0700   piperacillin-tazobactam (ZOSYN) IVPB 3.375 g        3.375 g 100 mL/hr over 30 Minutes Intravenous  Once 06/21/2021 0653 06/08/2021 0733        Rest per NP/medical resident whose note is outlined above and that I agree with    Dr. Brand Males, M.D., [facility].C.P Pulmonary and Critical Care Medicine Staff Physician [place] [facility] Pager: [phone], If no answer or between  15:00h - 7:00h: call [phone]  06/28/2021 2:00 PM

## 2021-06-28 NOTE — Progress Notes (Signed)
  Speech Language Pathology Treatment: Dysphagia  Patient Details Name: Daniel King MRN: [id number] DOB: 1946-08-31 Today's Date: 06/28/2021 Time: [id number] SLP Time Calculation (min) (ACUTE ONLY): 10 min  Assessment / Plan / Recommendation Clinical Impression  SLP skilled session for dysphagia goals/management. Reviewed MBS with pt's wife showing her flouro loops of the study. Pt's voice is stronger today with occasional productive cough as wife reports pt is using his [place] independently.  Pt observed consuming Ensure mixed with ice cream, Boost Breeze and thin water via cup.  Congested cough noted x1 of 5 boluses - after Ensure mixture. ? if due to secretion retention mixing with melted icecream.   Pt with minimal intake before stating he was "done".  RR was up to 31 at rest during session but pt had recently been cleaned up by RNs.  Recommend when ready to advance to advance to dys3 diet for energy conservation given pt's respiratory status.  Pt and wife agreeable to plan and using teach back wife re-iterated need to stop intake and rest if overtly coughing or dyspenic.  Recommend to consider RMST to improve laryngeal closure and expectoration if aspiration occurs.  Pt needed max cues for precautions but wife reinforces information/precautions.    HPI HPI: 75 yo male adm to [facility] on perforated bowel, gangrene of colon from obstruction s/p abdominal colectomy/ileostomy 06/13/2021.  Pt required intubation and was extubated on 8/22.  CXR showed progresive opacities at lung bases on 8/23//22. 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  Pt with reverse lordosis of cervical spine.  Swallow eval ordered.  Pt has NG removed at this time.  Wife, [name], present and pt and wife deny pt having dysphagia PTA but report he did see an SLP at [facility] for swallowing following his cva.      SLP Plan  Continue with current plan of care        Recommendations  Diet recommendations: Dysphagia 3 (mechanical soft);Nectar-thick liquid;Thin liquid Liquids provided via: Cup;Straw;Teaspoon Medication Administration: Crushed with puree (with puree) Supervision: Staff to assist with self feeding Compensations: Slow rate;Small sips/bites;Other (Comment) (cough and expectorate if voice gurly or reflexively coughing) Postural Changes and/or Swallow Maneuvers: Seated upright 90 degrees;Upright 30-60 min after meal                Oral Care Recommendations: Oral care BID Follow up Recommendations: Other (comment) (TBD) SLP Visit Diagnosis: Dysphagia, unspecified (R13.10);Dysphagia, oropharyngeal phase (R13.12) Plan: Continue with current plan of care       GO           [name], MS [place] Office [phone] Pager [phone]   Macario Golds 06/28/2021, 3:52 PM

## 2021-06-28 NOTE — TOC Progression Note (Signed)
Transition of Care [facility]) - Progression Note    Patient Details  Name: Kedwin Whitecotton MRN: [id number] Date of Birth: 18-Jan-1946  Transition of Care [facility]) CM/SW Contact  Leeroy Cha, RN Phone Number: 06/28/2021, 7:44 AM  Clinical Narrative:    S: Date of admit 06/06/2021 with LOS 5 for today 06/27/2021 : Jearl Retterer is  -- stil on precedex gtt. Improving mentatl status. Using phone and talking. Refusing BiPAP at night. MBS - D3 diet recommended.  Wife at bedside - says he is unaware of cancer diagnosis of colon   O:  Blood pressure (!) 225/94, pulse 82, temperature (!) 97.4 F (36.3 C), temperature source Axillary, resp. rate (!) 22, height 6' (1.829 m), weight 66.4 kg, SpO2 94 %.     More awake Looking better More interctive O2 on Barrell chest TOC PLAN:  request sent to [facility] on [id number] patient not dcd.  Will f/u for nest step.   Expected Discharge Plan: Home/Self Care Barriers to Discharge: Continued Medical Work up  Expected Discharge Plan and Services Expected Discharge Plan: Home/Self Care   Discharge Planning Services: CM Consult   Living arrangements for the past 2 months: Single Family Home                                       Social Determinants of Health (SDOH) Interventions    Readmission Risk Interventions No flowsheet data found.

## 2021-06-28 NOTE — Progress Notes (Signed)
06/27/21 1243  Pain Assessment  Pain Assessment No/denies pain  General Information  HPI 75 yo male adm to [facility] on perforated bowel, gangrene of colon from obstruction s/p abdominal colectomy/ileostomy 06/07/2021.  Pt required intubation and was extubated on 8/22.  CXR showed progresive opacities at lung bases on 8/23//22. 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  Pt with reverse lordosis of cervical spine.  Swallow eval ordered.  Pt has NG removed at this time.  Wife, [name], present and pt and wife deny pt having dysphagia PTA but report he did see an SLP at [facility] for swallowing following his cva.  Type of Study MBS-Modified Barium Swallow Study  Previous Swallow Assessment bse earlier today, prior clnical eval - ? mbs at [facility] after cva in 08/2020  Diet Prior to this Study Other (Comment) (ice chips)  Temperature Spikes Noted No  Respiratory Status Nasal cannula  History of Recent Intubation Yes  Length of Intubations (days) 4 days  Date extubated 06/25/21  Behavior/Cognition Alert;Other (Comment);Distractible;Requires cueing  Oral Cavity Assessment Dry  Oral Care Completed by SLP No  Oral Cavity - Dentition Adequate natural dentition  Vision Functional for self feeding  Self-Feeding Abilities Needs assist  Patient Positioning Upright in chair  Baseline Vocal Quality Hoarse;Breathy;Low vocal intensity  Volitional Swallow Able to elicit  Anatomy [facility]  Pharyngeal Secretions Not observed secondary MBS      06/27/21 1246  Oral Assessment (Complete on admission/transfer/change in patient condition)  Does patient have any of the following "high(er) risk" factors? None of the above  Oral Preparation/Oral Phase  Oral Phase Impaired  Oral - Nectar  Oral - Nectar Teaspoon Decreased bolus cohesion;Premature spillage  Oral - Nectar Straw Premature spillage  Oral - Thin  Oral - Thin Teaspoon Decreased bolus  cohesion;Premature spillage  Oral - Thin Cup Decreased bolus cohesion;Premature spillage  Pharyngeal Phase  Pharyngeal Phase Impaired  Pharyngeal - Nectar  Pharyngeal- Nectar Teaspoon WFL  Pharyngeal Material does not enter airway  Pharyngeal- Nectar Straw WFL  Pharyngeal Material does not enter airway  Pharyngeal - Thin  Pharyngeal- Thin Teaspoon WFL  Pharyngeal Material does not enter airway  Pharyngeal- Thin Cup WFL;Penetration/Aspiration before swallow  Pharyngeal Material enters airway, remains ABOVE vocal cords and not ejected out  Pharyngeal- Thin Straw Penetration/Aspiration before swallow  Pharyngeal Material enters airway, remains ABOVE vocal cords and not ejected out     Narda Rutherford, CCC-SLP  Speech and Language Pathologist  Speech Pathology  Progress Notes      Incomplete  Date of Service:  06/28/2021  8:19 AM           Incomplete                   06/27/21 1248  Cervical Esophageal Phase  Cervical Esophageal Phase [facility]  Clinical Impression  Clinical Impression Pt given limited omnipaque *(thin consistency) and nectar barium during testing - did not provide solids or pudding barium.   Pt presents with deep laryngeal penetration with thin liquids via cup/straw consistently due to premature spillage into open airway. Cued cough did not fully clear penetrates.  No aspiration/penetration with thin via tsp, nectar via tsp, cup or straw nor significant retention.  Chin tuck posture tested to determine if prevents laryngeal penetration but pt unable to follow directions to complete adequately - ? due to cognition and/or coordination.  Pharyngeal swallow fortunately was strong without retention. Recommend  nectar consistency via cup/straw and thin via tsp.  RN present and advised to recommendations.  When pt is appropriate for solid advancement, given premature spillage of liquids and pt's mild dysarthria, recommend dys3 diet.  Will follow up for po tolerance, dietary  recommendations.  SLP Visit Diagnosis Dysphagia, oropharyngeal phase (R13.12)  Impact on safety and function Mild aspiration risk  Swallow Evaluation Recommendations  SLP Diet Recommendations Dysphagia 3 (Mech soft) solids;Nectar thick liquid;Thin liquid  Liquid Administration via Cup;Straw (thin via tsp)  Supervision Full assist for feeding  Compensations Slow rate;Small sips/bites;Other (Comment) (cough and expectorate if voice gurgly or reflexively coughing)  Postural Changes Remain semi-upright after after feeds/meals (Comment);Seated upright at 90 degrees  Treatment Plan  Oral Care Recommendations Oral care BID  Other Recommendations Have oral suction available  Treatment Recommendations F/U MBS in ___ days (Comment)  Follow up Recommendations Other (comment)  Speech Therapy Frequency (ACUTE ONLY) min 2x/week  Treatment Duration 2 weeks  Interventions Aspiration precaution training;Patient/family education;Compensatory techniques;Trials of upgraded texture/liquids              06/27/21 1258  Prognosis  Prognosis for Safe Diet Advancement Fair  Barriers to Reach Goals Cognitive deficits  Individuals Consulted  Consulted and Agree with Results and Recommendations Patient;RN  Family Member Consulted   Report Sent to  Referring physician     06/27/21 1259  SLP Time Calculation  SLP Start Time (ACUTE ONLY) 1215  SLP Stop Time (ACUTE ONLY) 1237  SLP Time Calculation (min) (ACUTE ONLY) 22 min  SLP Evaluations  $ SLP Speech Visit 1 Visit  SLP Evaluations  $MBS Swallow 1 Procedure    Author Narda Rutherford CCC-SLP File Time 06/28/2021  8:19 AM  Author Type Speech and Language Pathologist Status Incomplete  Last Editor Fritz [place] Service [facility] Acct # [id number] [place] Date 06/04/2021

## 2021-06-28 NOTE — Discharge Instructions (Addendum)
[facility]      [facility] °[phone] ° °OPEN ABDOMINAL SURGERY: POST OP INSTRUCTIONS ° °Always review your discharge instruction sheet given to you by the facility where your surgery was performed. ° °IF YOU HAVE DISABILITY OR FAMILY LEAVE FORMS, YOU MUST BRING THEM TO THE OFFICE FOR PROCESSING.  PLEASE DO NOT GIVE THEM TO YOUR DOCTOR. ° °A prescription for pain medication may be given to you upon discharge.  Take your pain medication as prescribed, if needed.  If narcotic pain medicine is not needed, then you may take acetaminophen (Tylenol) or ibuprofen (Advil) as needed. °Take your usually prescribed medications unless otherwise directed. °If you need a refill on your pain medication, please contact your pharmacy. They will contact our office to request authorization.  Prescriptions will not be filled after 5pm or on week-ends. °You should follow a light diet the first few days after arrival home, such as soup and crackers, pudding, etc.unless your doctor has advised otherwise. A high-fiber, low fat diet can be resumed as tolerated.   Be sure to include lots of fluids daily. Most patients will experience some swelling and bruising on the chest and neck area.  Ice packs will help.  Swelling and bruising can take several days to resolve °Most patients will experience some swelling and bruising in the area of the incision. Ice pack will help. Swelling and bruising can take several days to resolve..  °It is common to experience some constipation if taking pain medication after surgery.  Increasing fluid intake and taking a stool softener will usually help or prevent this problem from occurring.  A mild laxative (Milk of Magnesia or Miralax) should be taken according to package directions if there are no bowel movements after 48 hours. ° You may have steri-strips (small skin tapes) in place directly over the incision.  These strips should be left on the skin for 7-10 days.  If your surgeon used skin  glue on the incision, you may shower in 24 hours.  The glue will flake off over the next 2-3 weeks.  Any sutures or staples will be removed at the office during your follow-up visit. You may find that a light gauze bandage over your incision may keep your staples from being rubbed or pulled. You may shower and replace the bandage daily. °ACTIVITIES:  You may resume regular (light) daily activities beginning the next day--such as daily self-care, walking, climbing stairs--gradually increasing activities as tolerated.  You may have sexual intercourse when it is comfortable.  Refrain from any heavy lifting or straining until approved by your doctor. °You may drive when you no longer are taking prescription pain medication, you can comfortably wear a seatbelt, and you can safely maneuver your car and apply brakes ° °You should see your doctor in the office for a follow-up appointment approximately two weeks after your surgery.  Make sure that you call for this appointment within a day or two after you arrive home to insure a convenient appointment time. ° °WHEN TO CALL YOUR DOCTOR: °Fever over 101.0 °Inability to urinate °Nausea and/or vomiting °Extreme swelling or bruising °Continued bleeding from incision. °Increased pain, redness, or drainage from the incision. °Difficulty swallowing or breathing °Muscle cramping or spasms. °Numbness or tingling in hands or feet or around lips. ° °The clinic staff is available to answer your questions during regular business hours.  Please don’t hesitate to call and ask to speak to one of the nurses if you have concerns. ° °For   further questions, please visit [email or web address]  °

## 2021-06-29 DIAGNOSIS — Z515 Encounter for palliative care: Secondary | ICD-10-CM

## 2021-06-29 DIAGNOSIS — K56699 Other intestinal obstruction unspecified as to partial versus complete obstruction: Secondary | ICD-10-CM

## 2021-06-29 DIAGNOSIS — C19 Malignant neoplasm of rectosigmoid junction: Principal | ICD-10-CM

## 2021-06-29 DIAGNOSIS — K55049 Acute infarction of large intestine, extent unspecified: Secondary | ICD-10-CM | POA: Diagnosis not present

## 2021-06-29 DIAGNOSIS — Z7189 Other specified counseling: Secondary | ICD-10-CM

## 2021-06-29 LAB — BASIC METABOLIC PANEL
Anion gap: 8 (ref 5–15)
BUN: 9 mg/dL (ref 8–23)
CO2: 31 mmol/L (ref 22–32)
Calcium: 8.1 mg/dL — ABNORMAL LOW (ref 8.9–10.3)
Chloride: 96 mmol/L — ABNORMAL LOW (ref 98–111)
Creatinine, Ser: 0.6 mg/dL — ABNORMAL LOW (ref 0.61–1.24)
GFR, Estimated: >60 mL/min (ref >60)
Glucose, Bld: 118 mg/dL — ABNORMAL HIGH (ref 70–99)
Potassium: 4 mmol/L (ref 3.5–5.1)
Sodium: 135 mmol/L (ref 135–145)

## 2021-06-29 LAB — CBC
HCT: 32.4 % — ABNORMAL LOW (ref 39.0–52.0)
Hemoglobin: 9.8 g/dL — ABNORMAL LOW (ref 13.0–17.0)
MCH: 27.2 pg (ref 26.0–34.0)
MCHC: 30.2 g/dL (ref 30.0–36.0)
MCV: 90 fL (ref 80.0–100.0)
Platelets: 406 10*3/uL — ABNORMAL HIGH (ref 150–400)
RBC: 3.6 MIL/uL — ABNORMAL LOW (ref 4.22–5.81)
RDW: 17.8 % — ABNORMAL HIGH (ref 11.5–15.5)
WBC: 11.5 10*3/uL — ABNORMAL HIGH (ref 4.0–10.5)
nRBC: 1.7 % — ABNORMAL HIGH (ref 0.0–0.2)

## 2021-06-29 LAB — GLUCOSE, CAPILLARY
Glucose-Capillary: 100 mg/dL — ABNORMAL HIGH (ref 70–99)
Glucose-Capillary: 102 mg/dL — ABNORMAL HIGH (ref 70–99)
Glucose-Capillary: 110 mg/dL — ABNORMAL HIGH (ref 70–99)
Glucose-Capillary: 90 mg/dL (ref 70–99)
Glucose-Capillary: 93 mg/dL (ref 70–99)
Glucose-Capillary: 94 mg/dL (ref 70–99)
Glucose-Capillary: 98 mg/dL (ref 70–99)

## 2021-06-29 LAB — BLOOD GAS, ARTERIAL
Acid-Base Excess: 8.2 mmol/L — ABNORMAL HIGH (ref 0.0–2.0)
Bicarbonate: 34.3 mmol/L — ABNORMAL HIGH (ref 20.0–28.0)
O2 Saturation: 95.7 %
Patient temperature: 98.6
pCO2 arterial: 60 mmHg — ABNORMAL HIGH (ref 32.0–48.0)
pH, Arterial: 7.375 (ref 7.350–7.450)
pO2, Arterial: 81.5 mmHg — ABNORMAL LOW (ref 83.0–108.0)

## 2021-06-29 LAB — MAGNESIUM: Magnesium: 2 mg/dL (ref 1.7–2.4)

## 2021-06-29 LAB — PHOSPHORUS: Phosphorus: 2.4 mg/dL — ABNORMAL LOW (ref 2.5–4.6)

## 2021-06-29 MED ORDER — HYDROMORPHONE HCL 1 MG/ML IJ SOLN
0.5000 mg | INTRAMUSCULAR | Status: DC | PRN
  Administered 2021-06-29 – 2021-07-02 (×12): 0.5 mg via INTRAVENOUS
  Filled 2021-06-29 (×12): qty 1

## 2021-06-29 MED ORDER — ACETAMINOPHEN 325 MG PO TABS: 650.0000 mg | ORAL_TABLET | Freq: Four times a day (QID) | ORAL | Status: DC | PRN

## 2021-06-29 MED ORDER — IPRATROPIUM-ALBUTEROL 0.5-2.5 (3) MG/3ML IN SOLN
3.0000 mL | Freq: Four times a day (QID) | RESPIRATORY_TRACT | Status: DC
  Administered 2021-06-30 (×2): 3 mL via RESPIRATORY_TRACT
  Filled 2021-06-29 (×3): qty 3

## 2021-06-29 MED ORDER — OXYCODONE HCL 5 MG PO TABS
5.0000 mg | ORAL_TABLET | ORAL | Status: DC | PRN
  Administered 2021-06-29 – 2021-07-02 (×8): 5 mg via ORAL
  Filled 2021-06-29 (×8): qty 1

## 2021-06-29 NOTE — Progress Notes (Signed)
eLink Physician-Brief Progress Note Patient Name: Daniel King DOB: [birth year] MRN: [id number]   Date of Service  06/29/2021  HPI/Events of Note  Patient previously agitated but now calm.  eICU Interventions  No intervention.        [name] [name] 06/29/2021, 2:50 AM

## 2021-06-29 NOTE — Progress Notes (Signed)
PROGRESS NOTE    Daniel King  [id number] DOB: [birth year] DOA: 06/27/2021 PCP: Henreitta Cea, MD   Brief Narrative:  Patient is a 75 year old Caucasian male who presented with 24 hours of worsening abdominal pain on 06/05/2021 with a last bowel movement on 818.  He was noted to have abdominal distention and tenderness.  CT of the abdomen was done and assessed with concern for primary colon cancer in the mid sigmoid colon complicated by colonic obstruction with pneumatosis involving the cecum and ascending colon, assistant with ischemic colitis.  He then had evidence of bowel perforation with small volume pneumoperitoneum.  He was evaluated by the general surgery team and recommended an open colectomy and ileostomy.  He was on Plavix prior to admission he was started on IV Zosyn.  Intraoperative findings were notable for massively dilated colon, cecum and ascending colon with ischemia and patches of phlegmon and gangrene of approximately 3 feet of colon was removed.  There was small volume pneumoperitoneum and rocky hard rectosigmoid mass worrisome for malignancy.  He had intraoperative hypotension requiring vasopressors return to ICU postsurgery on mechanical ventilation and postop hemoglobin was 7.6 was given 1 unit PRBCs.  PCCM was consulted for assistance with ICU care and then subsequently patient was extubated on 06/26/2021.  He has some postoperative confusion and delirium and was still confused but was placed on Precedex drip.  06/28/2021 Precedex drip was stopped and SLP evaluated and noted mild dysphagia.  He was switched over to [facility] hospital[facility] managing multiple medical issues on 06/29/2021.  Currently is clinical course is been the following: 8/19 admit with abd pain, CT with concern for perforation to OR for ex-lap with abdominal colectomy, colostomy. Returned to ICU on vent.  MRSA PCR positive COVID PCR negative Flu PCR negative Left IJ 8/19  Foley 8/19 > 8/22 ETT 8/19   >>8/22 06/24/21 - NAEON. UOP a bit better, still relatively oliguric. Cr stable. Alert, on SBT. Anticipate extubation. 06/25/21 : Failed SBT yesterday.  Lasix given.  Yesterday afebrile.  White count down to 11 point 9K.  Remains on ventilator 40% FiO2.  On fentanyl infusion, Precedex infusion, Levophed infusion off.  Only on vasopressin infusion.  Getting magnesium sulfate and potassium.  Remains on Zosyn.  Surgery noted that his oliguria is improving and creatinine normalized and to anticipate third space losses given colectomy -extubated 8/23 remains extubated. Hypercapnic. Did not do much BiPAP due to NG tube. On amio gtt and rate controlled . This AM - agitated and confused and hallucinating despite precedex gtt. Wheezing. Chart review shows severe Protein cal malnutn pre admit alb 2.,2. EKG now with NSR - QTc 511 mosec. On precedex, amio gtt, comparize prn.  Amiodarone discontinued due to prolonged QTC echocardiogram ordered.LVEF 60%.  Normal function.  Mild left ventricular hypertrophy RV function mildly reduced 8/24: Still confused.  Still on Precedex drip.  NG tube removed per surgery.  Modified barium swallow ordered. 8/25 Precedex stopped.  No longer confused.  Slowly advancing diet.  Barium swallow did demonstrate mild dysphagia.  Recommendations made by SLP.  Fairly hypertensive, Norvasc increased, as needed hydralazine. .  No longer needs critical care we will ask medicine to continue to provide support.  Internal medicine contacted and asked to assume primary consultant duties for medical needs, pulmonary following for specific pulmonary problems.  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  -Now s/p Open Colectomy with Ileostomy - present on Admisison -Neurosurgery is primary and he received 5 days of Zosyn and this ended 06/27/2021 -Currently on a dysphagia 3 diet per SLP recommendation -WOC Nurse involved [employer] changed today -General surgery recommending SCDs and PPI starting subcu heparin on 821 -Pathology results were discussed by the general surgery team to the patient and the patient's family and showed a T4N1 obstructing rectosigmoid tumor -Findings will be present at the GI tumor board and outpatient oncology follow-up will be needed -PT OT recommending SNF and per surgery they will need to work on continue mobilizing and his work on his nutrition  Chronic hypoxic and hypercarbic respiratory failure in the context of advanced chronic obstructive pulmonary disease and ongoing tobacco abuse Current every day smoker - 60ppd hx - present on admit -Pulmonary managing -Per Pulmonary: -Limited pulmonary reserve, actually on nocturnal CPAP at baseline due to advanced lung disease and pulmonary deconditioning Is actually doing quite well from a pulmonary standpoint.  His primary issues in the intensive care have been delirium, which is improved, and postoperative hypertension.  Overall however his postoperative course has gone as well as could be expected given his comorbidities.  I have asked the internal medicine service to assume his care in regards to hypertension, arrhythmia, electrolyte management.  We are still going to follow intermittently for his underlying lung disease -Continue supplemental oxygen, with nocturnal while here in the intensive care Continue  scheduled bronchodilators, and inhaled corticosteroids (he is on Stiolto respimat at home.  Adding flutter valve Maximize mobility Dysphagia precautions given risk for aspiration Be careful with narcotics  Acute metabolic encephalopathy -new onset 06/26/2021 -His delirium confusion is improved and he is off of Precedex drip now as it was stopped 06/28/2021  A. fib RVR 06/25/2021.  Prolonged QTC 511 ms-new 06/26/2021 HTN -C/w Telemetery Monitoring  -Currently on amlodipine 10 mg p.o. daily and hydralazine -Continue with metoprolol tartrate 12.5 g p.o. twice daily  Anemia of blood loss and critical illness  -  GERD/GI Prohylaxis -Continue with pantoprazole 40 mg daily  Hx Prostate Cancer  stage 4 micrometastic - baseline prior to and on admit  Hx of bladder cancer in 2006 - early per patient on 06/28/2021 (MD add)  History of chronic HLD   Hx CVA October 2021- on DAPT prior to admit and this has been resumed  Severe protein calorie Malnutrition w/ cachexia (POA) Dysphagia, identified by modified barium swallow on 8/24 Failure to thrive  -Nutritionist consulted and recommending feeding supplements with boost breeze 1 container p.o. 3 times daily  DVT prophylaxis: Heparin 5,000 units sq q8h Code Status: FULL CODE Family Communication:  Discssed with wife at bedside Disposition Plan: Pending further clinical improvement and anticipate discharging to SNF next 48 hours  Status is: Inpatient  Remains inpatient appropriate because:Unsafe d/c plan, IV treatments appropriate due to intensity of illness or inability to take PO, and Inpatient level of care appropriate due to severity of illness  Dispo: The patient is from: Home  Anticipated d/c is to: SNF              Patient currently is not medically stable to d/c.   Difficult to place patient No  Consultants:  General surgery PCCM  Procedures: EXPLORATORY LAPAROTOMY ABDOMINAL COLECTOMY WITH END ILEOSTOMY  06/09/2021  Antimicrobials:  Anti-infectives (From admission, onward)    Start     Dose/Rate Route Frequency Ordered Stop   07/02/2021 1400  piperacillin-tazobactam (ZOSYN) IVPB 3.375 g        3.375 g 12.5 mL/hr over 240 Minutes Intravenous Every 8 hours 06/17/2021 1357 06/27/21 1016   06/15/2021 0745  cefoTEtan (CEFOTAN) 2 g in sodium chloride 0.9 % 100 mL IVPB        2 g 200 mL/hr over 30 Minutes Intravenous On call to O.R. 06/14/2021 0740 06/24/2021 0831   06/20/2021 0745  sodium chloride 0.9 % with cefoTEtan (CEFOTAN) ADS Med       Note to Pharmacy: Charmayne Sheer   : cabinet override      06/11/2021 0745 06/20/2021 0854   06/29/2021 0700  piperacillin-tazobactam (ZOSYN) IVPB 3.375 g        3.375 g 100 mL/hr over 30 Minutes Intravenous  Once 06/09/2021 0653 06/24/2021 0733        Subjective: Seen and examined at bedside he was not as confused today and he is much more awake and alert.  Wife thinks he is still mildly confused but nothing like what he was.  No chest pain or shortness breath.  Has some abdominal discomfort.  No lightheadedness or dizziness.  No other concerns or complaints this time.  Objective: Vitals:   06/29/21 1600 06/29/21 1612 06/29/21 1613 06/29/21 1630  BP: (!) 167/89 (!) 223/191 (!) 172/57 (!) 141/54  Pulse: 79 96 90 75  Resp: $Remo'15 16 18 16  'FKBxH$ Temp: 98 F (36.7 C)     TempSrc: Axillary     SpO2: 98% 94% 93% 98%  Weight:      Height:        Intake/Output Summary (Last 24 hours) at 06/29/2021 1710 Last data filed at 06/29/2021 1654 Gross per 24 hour  Intake 704.16 ml  Output 750 ml  Net -45.84 ml   Filed Weights   07/02/2021 0356 06/23/21 0456 06/24/21 0500  Weight: 68 kg 67.9 kg 66.4 kg    Examination: Physical Exam:  Constitutional: Chronically ill-appearing Caucasian male currently no acute distress appears calm Eyes: Lids and conjunctivae normal, sclerae anicteric  ENMT: External Ears, Nose appear normal. Grossly normal hearing.  Respiratory: Slightly  diminished to auscultation bilaterally with coarse breath sounds, no wheezing, rales, rhonchi or crackles. Normal respiratory effort and patient is not tachypenic. No accessory muscle use.  Unlabored breathing Cardiovascular: RRR, no murmurs / rubs / gallops. S1 and S2 auscultated. No extremity edema.  Abdomen: Soft, non-tender, distended secondary body habitus.  Has a midline wound VAC in place and a colostomy bag. Bowel sounds positive.  GU: Deferred. Musculoskeletal: No clubbing / cyanosis of digits/nails. No joint deformity upper and lower extremities.  Skin: No rashes, lesions, ulcers on limited skin evaluation. No induration; Warm and dry.  Neurologic: CN 2-12 grossly intact with no focal deficits. Romberg sign and cerebellar reflexes not assessed.  Psychiatric: Normal judgment and insight. Alert and oriented x 3. Normal mood and appropriate affect.   Data Reviewed: I have personally reviewed following labs and imaging studies  CBC: Recent Labs  Lab 06/25/21 0325 06/26/21 0240 06/27/21 0610 06/28/21 0244 06/29/21 0253  WBC 11.9* 11.5* 9.3 10.2 11.5*  HGB 8.0* 7.9* 8.2* 8.9* 9.8*  HCT 25.0* 25.0* 25.7* 28.9* 32.4*  MCV 87.4 88.7 88.9 88.7 90.0  PLT 231 263 291 338 462*   Basic Metabolic Panel: Recent Labs  Lab 06/25/21 0325 06/25/21 1854 06/26/21 0240 06/27/21 0610 06/28/21 0244 06/29/21 0253  NA 135 137 139 139 137 135  K 3.0* 2.8* 3.8 3.1* 3.2* 4.0  CL 99 98 99 97* 95* 96*  CO2 $Re'28 30 31 29 'OGG$ 33* 31  GLUCOSE 131* 120* 101* 87 102* 118*  BUN 26* $Remov'20 16 11 'nxiPyN$ 7* 9  CREATININE 0.97 0.88 0.76 0.67 0.55* 0.60*  CALCIUM 7.3* 7.3* 7.5* 7.5* 7.7* 8.1*  MG 1.9  --  1.9 1.9 2.2 2.0  PHOS  --   --  1.4* 2.1* 1.8* 2.4*   GFR: Estimated Creatinine Clearance: 74.9 mL/min (A) (by C-G formula based on SCr of 0.6 mg/dL (L)). Liver Function Tests: Recent Labs  Lab 06/23/21 0320 06/26/21 0240 06/27/21 0610  AST 64* 81* 42*  ALT 38 114* 84*  ALKPHOS 59 68 72  BILITOT 1.1 0.8 1.3*   PROT 4.5* 5.0* 5.1*  ALBUMIN 2.2* 2.2* 2.2*   No results for input(s): LIPASE, AMYLASE in the last 168 hours. No results for input(s): AMMONIA in the last 168 hours. Coagulation Profile: No results for input(s): INR, PROTIME in the last 168 hours. Cardiac Enzymes: No results for input(s): CKTOTAL, CKMB, CKMBINDEX, TROPONINI in the last 168 hours. BNP (last 3 results) No results for input(s): PROBNP in the last 8760 hours. HbA1C: No results for input(s): HGBA1C in the last 72 hours. CBG: Recent Labs  Lab 06/29/21 0336 06/29/21 0443 06/29/21 0730 06/29/21 1153 06/29/21 1606  GLUCAP 110* 90 100* 102* 93   Lipid Profile: No results for input(s): CHOL, HDL, LDLCALC, TRIG, CHOLHDL, LDLDIRECT in the last 72 hours. Thyroid Function Tests: No results for input(s): TSH, T4TOTAL, FREET4, T3FREE, THYROIDAB in the last 72 hours. Anemia Panel: No results for input(s): VITAMINB12, FOLATE, FERRITIN, TIBC, IRON, RETICCTPCT in the last 72 hours. Sepsis Labs: No results for input(s): PROCALCITON, LATICACIDVEN in the last 168 hours.  Recent Results (from the past 240 hour(s))  Resp Panel by RT-PCR (Flu A&B, Covid) Nasopharyngeal Swab     Status: None   Collection Time: 06/06/2021  6:37 AM   Specimen: Nasopharyngeal Swab; Nasopharyngeal(NP) swabs in vial transport medium  Result Value Ref Range Status   SARS Coronavirus 2 by RT PCR NEGATIVE NEGATIVE Final    Comment: (NOTE) SARS-CoV-2 target nucleic acids are NOT DETECTED.  The SARS-CoV-2 RNA is generally detectable in upper respiratory specimens during the acute phase of infection. The lowest concentration of SARS-CoV-2 viral copies this assay can detect is 138 copies/mL. A negative result does not preclude SARS-Cov-2 infection and should not be used as the sole basis for treatment or other patient management decisions. A negative result may occur with  improper specimen collection/handling, submission of specimen other than  nasopharyngeal swab, presence of viral mutation(s) within the areas targeted by this assay, and inadequate number of viral copies(<138 copies/mL). A negative result must be combined with clinical observations, patient history, and epidemiological information. The expected result is Negative.  Fact Sheet for Patients:  EntrepreneurPulse.com.au  Fact Sheet for Healthcare Providers:  IncredibleEmployment.be  This test is no t yet approved or cleared by the [place] FDA and  has been authorized for detection and/or diagnosis of SARS-CoV-2 by FDA under an Emergency Use Authorization (EUA). This  EUA will remain  in effect (meaning this test can be used) for the duration of the COVID-19 declaration under Section 564(b)(1) of the Act, 21 U.S.C.section 360bbb-3(b)(1), unless the authorization is terminated  or revoked sooner.       Influenza A by PCR NEGATIVE NEGATIVE Final   Influenza B by PCR NEGATIVE NEGATIVE Final    Comment: (NOTE) The Xpert Xpress SARS-CoV-2/FLU/RSV plus assay is intended as an aid in the diagnosis of influenza from Nasopharyngeal swab specimens and should not be used as a sole basis for treatment. Nasal washings and aspirates are unacceptable for Xpert Xpress SARS-CoV-2/FLU/RSV testing.  Fact Sheet for Patients: EntrepreneurPulse.com.au  Fact Sheet for Healthcare Providers: IncredibleEmployment.be  This test is not yet approved or cleared by the [place] FDA and has been authorized for detection and/or diagnosis of SARS-CoV-2 by FDA under an Emergency Use Authorization (EUA). This EUA will remain in effect (meaning this test can be used) for the duration of the COVID-19 declaration under Section 564(b)(1) of the Act, 21 U.S.C. section 360bbb-3(b)(1), unless the authorization is terminated or revoked.  Performed at [facility], [place] [address], [place], [place] [address]   MRSA Next Gen by PCR, Nasal     Status: Abnormal   Collection Time: 06/07/2021 10:52 AM   Specimen: Nasal Mucosa; Nasal Swab  Result Value Ref Range Status   MRSA by PCR Next Gen DETECTED (A) NOT DETECTED Final    Comment: RESULT CALLED TO, READ BACK BY AND VERIFIED WITH: HEAVNER,A. RN $RemoveBefor'@[email or web address]$  ON 08.19.2022 BY COHEN,K (NOTE) The GeneXpert MRSA Assay (FDA approved for NASAL specimens only), is one component of a comprehensive MRSA colonization surveillance program. It is not intended to diagnose MRSA infection nor to guide or monitor treatment for MRSA infections. Test performance is not FDA approved in patients less than 21 years old. Performed at [facility], [place] [address], [place], [place] [address]     RN Pressure Injury Documentation: Pressure Injury 06/25/21 Coccyx Medial;Right;Left Deep Tissue Pressure Injury - Purple or maroon localized area of discolored intact skin or blood-filled blister due to damage of underlying soft tissue from pressure and/or shear. (Active)  06/25/21 1300  Location: Coccyx  Location Orientation: Medial;Right;Left  Staging: Deep Tissue Pressure Injury - Purple or maroon localized area of discolored intact skin or blood-filled blister due to damage of underlying soft tissue from pressure and/or shear.  Wound Description (Comments):   Present on Admission:     Estimated body mass index is 19.85 kg/m as calculated from the following:   Height as of this encounter: 6' (1.829 m).   Weight as of this encounter: 66.4 kg.  Malnutrition Type:  Nutrition Problem: Moderate Malnutrition Etiology: chronic illness (COPD)  Malnutrition Characteristics:  Signs/Symptoms: mild fat depletion, mild muscle depletion, moderate muscle depletion  Nutrition Interventions:  Interventions: Refer to RD note for recommendations  Radiology Studies: No results found.  Scheduled Meds:  amLODipine  10 mg Oral Daily    aspirin EC  81 mg Oral Daily   budesonide (PULMICORT) nebulizer solution  0.5 mg Nebulization BID   chlorhexidine gluconate (MEDLINE KIT)  15 mL Mouth Rinse BID   Chlorhexidine Gluconate Cloth  6 each Topical Daily   clopidogrel  75 mg Oral Daily   feeding supplement  1 Container Oral TID BM   folic acid  1 mg Oral Daily   heparin injection (subcutaneous)  5,000 Units Subcutaneous Q8H   insulin aspart  0-9 Units Subcutaneous Q4H  ipratropium-albuterol  3 mL Nebulization Q4H   lip balm  1 application Topical BID   metoprolol tartrate  12.5 mg Oral BID   pantoprazole  40 mg Oral Daily   sodium chloride flush  10-40 mL Intracatheter Q12H   thiamine  100 mg Oral Daily   Continuous Infusions:  sodium chloride 10 mL/hr at 06/29/21 1654   methocarbamol (ROBAXIN) IV      LOS: 7 days   Kerney Elbe, DO [facility] PAGER is on [place]  If 7PM-7AM, please contact night-coverage [email or web address]

## 2021-06-29 NOTE — TOC Progression Note (Addendum)
Transition of Care [facility]) - Progression Note    Patient Details  Name: Daniel King MRN: [id number] Date of Birth: 1946/09/26  [facility] CM/SW Contact  Leeroy Cha, RN Phone Number: 06/29/2021, 3:08 PM  Clinical Narrative:    [phone] Spoke with wife would like [facility] or somewhere in the [place] side of [place]. Fl2 sent out to [facility] Expected Discharge Plan: Home/Self Care Barriers to Discharge: Continued Medical Work up  Expected Discharge Plan and Services Expected Discharge Plan: Home/Self Care   Discharge Planning Services: CM Consult   Living arrangements for the past 2 months: Single Family Home                                       Social Determinants of Health (SDOH) Interventions    Readmission Risk Interventions No flowsheet data found.

## 2021-06-29 NOTE — Progress Notes (Signed)
  Speech Language Pathology Treatment: Dysphagia  Patient Details Name: Daniel King MRN: [id number] DOB: Mar 21, 1946 Today's Date: 06/29/2021 Time: [id number] SLP Time Calculation (min) (ACUTE ONLY): 16 min  Assessment / Plan / Recommendation Clinical Impression  Pt seen to address dysphagia goals.  Per wife, pt was agitated last night - today seems mildly sleepy and mildy increased dysarthria.  SLP observed pt consuming medicatons with applesauce and nectar thick tea/cranberry juice.  Session was abbreviated as RT also arrived to room to give pt breathing treatement.  Pt benefited from moderate verbal cues to sit upright for airway safety with po intake. He continues with ongoing congestion today and cued cough did not fully clear.  Intake of breakfast tray appeared to be 100% and wife denies pt coughing with po.  WBC 11.5 and lungs are decreased, rhonchorous.    Recommend continue nectar x tsps of thin with soft solids for energy conservation.  Wife and pt agreeable to plan. Encouraged pt to consume tsps of thin liquids sitting fully upright. Anticipate pt will be able to advance diet clinically without repeat instrumental evaluation with improved mentation/strength.    Advise RMST to improve laryngeal closure/airway clearance, which ws not initiated today due to pt requiring breathing tx.  Pt is making excellent progress.    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  Pt required intubation and was extubated on 8/22.  CXR showed progresive opacities at lung bases on 8/23//22. 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  Pt with reverse lordosis of cervical spine.  Swallow eval ordered.  Pt has NG removed at this time.  Wife, Daniel King, present and pt and wife deny pt having dysphagia PTA but report he did see an SLP at [facility] for swallowing  following his cva.      SLP Plan  Continue with current plan of care       Recommendations  Liquids provided via: Cup;Straw;Teaspoon Medication Administration: Crushed with puree (with puree) Supervision: Staff to assist with self feeding Compensations: Slow rate;Small sips/bites;Other (Comment) (cough and expectorate if voice gurly or reflexively coughing) Postural Changes and/or Swallow Maneuvers: Seated upright 90 degrees;Upright 30-60 min after meal                Oral Care Recommendations: Oral care BID Follow up Recommendations: Other (comment) (TBD) SLP Visit Diagnosis: Dysphagia, unspecified (R13.10);Dysphagia, oropharyngeal phase (R13.12) Plan: Continue with current plan of care       Daniel King 06/29/2021, 1:32 PM Daniel Lime, MS [name] SLP Acute Rehab Services Office [phone] Pager [phone]

## 2021-06-29 NOTE — Plan of Care (Signed)

## 2021-06-29 NOTE — Consult Note (Addendum)
WOC Nurse Wound Consult Note: Vac dressing change performed.  Pt was medicated for pain prior to the procedure and tolerated without apparent distress. Midline abd with full thickness post-op wound, 100% beefy red, refer to previous progress notes for measurements,  small amt pink drainage. Applied one piece black foam applied to 134m cont suction. [place] will plan to change again on Mon   [facility] consult [facility] Pt had ileostomy surgery performed 8/19. Ostomy pouch change performed with pt's wife present to watch and discuss.  Pt was was in ICU and watched the procedure but did not participate. Wife states she will be the total caregiver for ostomy pouch application and emptying after discharge. Stoma is red and viable, above skin level, slightly prolapsed, 1 1/2 inches. Small amt bloody leakage around the base of the stoma, mod amt brown liquid stool in the pouch Ostomy pouching: Applied barrier ring to attempt to maintain a seal, then 2 piece pouching system. Use supplies: barrier ring, Lawson # [phone] wafer LKellie Simmering# [id number], pouch Lawson # [id number]  2 sets of extra supplies left at the bedside for staff nurses' use.  Wife was able to apply pouch with minimal assistance, and can open and close the velcro to empty. Enrolled patient in [place]: Yes, previously W[place] will continue to perform teaching sessions when pt is stable and out of ICU. Thank-you,  DJulien GirtMSN, [place]Lem[place]

## 2021-06-29 NOTE — Evaluation (Signed)
Physical Therapy Evaluation Patient Details Name: Daniel King MRN: [id number] DOB: 10/10/1946 Today's Date: 06/29/2021   History of Present Illness  Patient is a 75 year old male who presented on 07/02/2021 with two day history of abdominal pain and discomfort. Patient's CT was concerning for preforation. 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 Patient was also found to have septic shock, acute respiratory failure and blood loss anemia. Patient was extubated on 8/22.   PMH: COPD, A fib,h/o CVA  Clinical Impression  Pt admitted with above diagnosis. At baseline, pt with limited mobility at baseline.  He transfers via pivot and was working on some ambulation with HHPT.  Today, pt requiring mod A of 2 for transfers with increased time for cues and rest breaks.  Pt fatigued very easily and was only able to tolerate transfer, repositioning, and a few exercises.  Pt currently with functional limitations due to the deficits listed below (see PT Problem List). Pt will benefit from skilled PT to increase their independence and safety with mobility to allow discharge to the venue listed below.       Follow Up Recommendations SNF    Equipment Recommendations  None recommended by PT    Recommendations for Other Services       Precautions / Restrictions Precautions Precautions: Fall Precaution Comments: abdominal wound VAC, ileostomy, monitor BP and HR      Mobility  Bed Mobility Overal bed mobility: Needs Assistance Bed Mobility: Supine to Sit     Supine to sit: Mod assist;+2 for safety/equipment;+2 for physical assistance;HOB elevated     General bed mobility comments: increased time with tactile cues to bring legs to edge of bed, mod A x1-2 for trunk support; cues to scoot to edge with use of bed pad to facilitate    Transfers Overall transfer level: Needs assistance Equipment used: 2 person hand held  assist Transfers: Sit to/from [facility] Sit to Stand: Mod assist;+2 physical assistance;+2 safety/equipment Stand pivot transfers: Mod assist;+2 physical assistance;+2 safety/equipment       General transfer comment: initially able to support weight, note lower extremity buckling while pivoting to chair needing mod x2 for support  Ambulation/Gait             General Gait Details: unable today  Stairs            Wheelchair Mobility    Modified Rankin (Stroke Patients Only)       Balance Overall balance assessment: Needs assistance Sitting-balance support: Feet supported;Single extremity supported;Bilateral upper extremity supported Sitting balance-Leahy Scale: Poor Sitting balance - Comments: fatigues quickly leaning onto R forearm Postural control: Right lateral lean Standing balance support: Bilateral upper extremity supported Standing balance-Leahy Scale: Zero Standing balance comment: reliant on mod x2, LE buckling                             Pertinent Vitals/Pain Pain Assessment: Faces Faces Pain Scale: Hurts little more Pain Location: lower abdomen/wound vac site Pain Descriptors / Indicators: Grimacing Pain Intervention(s): Monitored during session    Home Living Family/patient expects to be discharged to:: Private residence Living Arrangements: Spouse/significant other Available Help at Discharge: Family;Available 24 hours/day Type of Home: House Home Access: Stairs to enter Entrance Stairs-Rails: Right;Left;Can reach both Entrance Stairs-Number of Steps: 7 Home Layout: Two level;Able to live on main level with bedroom/bathroom Home Equipment: Gilford Rile - 2 wheels;Bedside commode;Shower seat;Wheelchair - [employer]  bed      Prior Function Level of Independence: Needs assistance   Gait / Transfers Assistance Needed: Pt mostly transfers bed - BSC-wc; he was doing some short distance ambulation with [facility] PT/OT;  wife  states it was becoming more difficult to manage him at home  ADL's / Homemaking Assistance Needed: wife assisted with ADL tasks and mobility        Hand Dominance        Extremity/Trunk Assessment   Upper Extremity Assessment Upper Extremity Assessment: Defer to OT evaluation;Generalized weakness    Lower Extremity Assessment Lower Extremity Assessment: LLE deficits/detail;RLE deficits/detail RLE Deficits / Details: ROM WFL; MMT 4/5 LLE Deficits / Details: ROM WFL; MMT 4/5    Cervical / Trunk Assessment Cervical / Trunk Assessment: Kyphotic  Communication   Communication: HOH  Cognition Arousal/Alertness: Awake/alert Behavior During Therapy: Flat affect Overall Cognitive Status: Impaired/Different from baseline                         Following Commands: Follows one step commands with increased time       General Comments: patient needing increased time for all mobility and initiating directions, may be related to fatigue      General Comments General comments (skin integrity, edema, etc.): VSS on 4 L O2;  Pt fatigued quickly needing frequent rest breaks even with repositioing in chair    Exercises General Exercises - Lower Extremity Ankle Circles/Pumps: AROM;Both;5 reps;Supine Long Arc Quad: AROM;Both;10 reps;Seated   Assessment/Plan    PT Assessment Patient needs continued PT services  PT Problem List Decreased strength;Decreased mobility;Decreased safety awareness;Decreased range of motion;Decreased activity tolerance;Decreased cognition;Cardiopulmonary status limiting activity;Decreased balance;Decreased knowledge of use of DME;Pain       PT Treatment Interventions DME instruction;Therapeutic activities;Gait training;Therapeutic exercise;Patient/family education;Balance training;Functional mobility training    PT Goals (Current goals can be found in the Care Plan section)  Acute Rehab PT Goals Patient Stated Goal: home PT Goal Formulation: With  patient/family Time For Goal Achievement: 07/13/21 Potential to Achieve Goals: Good    Frequency Min 2X/week   Barriers to discharge        Co-evaluation PT/OT/SLP Co-Evaluation/Treatment: Yes Reason for Co-Treatment: Complexity of the patient's impairments (multi-system involvement);For patient/therapist safety PT goals addressed during session: Mobility/safety with mobility OT goals addressed during session: ADL's and self-care       AM-PAC PT "6 Clicks" Mobility  Outcome Measure Help needed turning from your back to your side while in a flat bed without using bedrails?: Total Help needed moving from lying on your back to sitting on the side of a flat bed without using bedrails?: Total Help needed moving to and from a bed to a chair (including a wheelchair)?: Total Help needed standing up from a chair using your arms (e.g., wheelchair or bedside chair)?: Total Help needed to walk in hospital room?: Total Help needed climbing 3-5 steps with a railing? : Total 6 Click Score: 6    End of Session Equipment Utilized During Treatment: Gait belt Activity Tolerance: Patient limited by fatigue Patient left: with chair alarm set;in chair;with family/visitor present;with call bell/phone within reach Nurse Communication: Mobility status PT Visit Diagnosis: Other abnormalities of gait and mobility (R26.89);Muscle weakness (generalized) (M62.81)    Time: 1203-1226 PT Time Calculation (min) (ACUTE ONLY): 23 min   Charges:   PT Evaluation $PT Eval Moderate Complexity: 1 Mod          Nolan Tuazon, PT Acute Clinical cytogeneticist  [id number] [facility] [phone]   Daniel King 06/29/2021, 1:03 PM

## 2021-06-29 NOTE — NC FL2 (Signed)
[place] LEVEL OF CARE SCREENING TOOL     IDENTIFICATION  Patient Name: Daniel King Birthdate: Nov 30, 1945 Sex: male Admission Date (Current Location): 07/03/2021  [facility] and [place] Number:  Herbalist and Address:  [facility],  [place] [address] [place]      Provider Number: [id number]  Attending Physician Name and Address:  Edison Pace, Md, MD  Relative Name and Phone Number:       Current Level of Care: Hospital Recommended Level of Care: [place] Prior Approval Number:    Date Approved/Denied:   PASRR Number: [id number]  Discharge Plan: SNF    Current Diagnoses: Patient Active Problem List   Diagnosis Date Noted   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   Acute respiratory failure with hypoxia ([place])    Perforated bowel ([place]) 06/13/2021   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 06/09/2021 06/21/2021   History of stroke 06/12/2021   Supplemental oxygen dependent 06/06/2021   COPD (chronic obstructive pulmonary disease) ([place]) 06/27/2021   Malnutrition of moderate degree 06/21/2021    Orientation RESPIRATION BLADDER Height & Weight     Self, Time, Situation, Place  Normal Continent Weight: 66.4 kg Height:  6' (182.9 cm)  BEHAVIORAL SYMPTOMS/MOOD NEUROLOGICAL BOWEL NUTRITION STATUS      Colostomy Diet (regular)  AMBULATORY STATUS COMMUNICATION OF NEEDS Skin   Extensive Assist Verbally Normal                       Personal Care Assistance Level of Assistance  Bathing, Feeding, Dressing Bathing Assistance: Limited assistance Feeding assistance: Limited assistance Dressing Assistance: Limited assistance     Functional Limitations Info  Sight, Hearing, Speech Sight Info: Adequate Hearing Info: Adequate Speech Info: Adequate    SPECIAL CARE FACTORS FREQUENCY  PT (By licensed PT), OT (By licensed OT)     PT Frequency:  5 x weekly OT Frequency: 5 x weekly            Contractures Contractures Info: Not present    Additional Factors Info  Code Status Code Status Info: full             Current Medications (06/29/2021):  This is the current hospital active medication list Current Facility-Administered Medications  Medication Dose Route Frequency Provider Last Rate Last Admin   0.9 %  sodium chloride infusion  250 mL Intravenous Continuous Ollis, Brandi L, NP 10 mL/hr at 06/29/21 1234 Infusion Verify at 06/29/21 1234   acetaminophen (TYLENOL) tablet 650 mg  650 mg Oral Q6H PRN Clovis Riley, MD       amLODipine (NORVASC) tablet 10 mg  10 mg Oral Daily Erick Colace, NP   10 mg at 06/29/21 1105   aspirin EC tablet 81 mg  81 mg Oral Daily Brand Males, MD   81 mg at 06/29/21 1104   budesonide (PULMICORT) nebulizer solution 0.5 mg  0.5 mg Nebulization BID Noe Gens L, NP   0.5 mg at 06/29/21 0734   chlorhexidine gluconate (MEDLINE KIT) (PERIDEX) 0.12 % solution 15 mL  15 mL Mouth Rinse BID Hunsucker, Bonna Gains, MD   15 mL at 06/29/21 0840   Chlorhexidine Gluconate Cloth 2 % PADS 6 each  6 each Topical Daily Hunsucker, Bonna Gains, MD   6 each at 06/29/21 1106   clopidogrel (PLAVIX) tablet 75 mg  75 mg Oral Daily Brand Males, MD   75  mg at 06/29/21 1105   feeding supplement (BOOST / RESOURCE BREEZE) liquid 1 Container  1 Container Oral TID BM Clovis Riley, MD   1 Container at 14/99/69 2493   folic acid (FOLVITE) tablet 1 mg  1 mg Oral Daily Adrian Saran, RPH   1 mg at 06/29/21 1105   heparin injection 5,000 Units  5,000 Units Subcutaneous Q8H Ileana Roup, MD   5,000 Units at 06/29/21 1355   hydrALAZINE (APRESOLINE) injection 20 mg  20 mg Intravenous Q4H PRN Erick Colace, NP       HYDROmorphone (DILAUDID) injection 0.5 mg  0.5 mg Intravenous Q4H PRN Romana Juniper A, MD       insulin aspart (novoLOG) injection 0-9 Units  0-9 Units Subcutaneous Q4H Ollis, Brandi L, NP    1 Units at 06/29/21 0000   ipratropium-albuterol (DUONEB) 0.5-2.5 (3) MG/3ML nebulizer solution 3 mL  3 mL Nebulization Q4H Brand Males, MD   3 mL at 06/29/21 1106   lip balm (CARMEX) ointment 1 application  1 application Topical BID Michael Boston, MD   1 application at 24/19/91 1100   magic mouthwash  15 mL Oral QID PRN Michael Boston, MD       methocarbamol (ROBAXIN) 1,000 mg in dextrose 5 % 100 mL IVPB  1,000 mg Intravenous Q6H PRN Michael Boston, MD       metoprolol tartrate (LOPRESSOR) tablet 12.5 mg  12.5 mg Oral BID Brand Males, MD   12.5 mg at 06/29/21 1105   ondansetron (ZOFRAN) injection 4 mg  4 mg Intravenous Q6H PRN Romana Juniper A, MD       oxyCODONE (Oxy IR/ROXICODONE) immediate release tablet 5 mg  5 mg Oral Q4H PRN Clovis Riley, MD       pantoprazole (PROTONIX) EC tablet 40 mg  40 mg Oral Daily Arlyn Dunning M, RPH   40 mg at 06/29/21 1105   sodium chloride flush (NS) 0.9 % injection 10-40 mL  10-40 mL Intracatheter Q12H Hunsucker, Bonna Gains, MD   10 mL at 06/29/21 1100   thiamine tablet 100 mg  100 mg Oral Daily Adrian Saran, RPH   100 mg at 06/29/21 1105     Discharge Medications: Please see discharge summary for a list of discharge medications.  Relevant Imaging Results:  Relevant Lab Results:   Additional Information [id number]  Leeroy Cha, RN

## 2021-06-29 NOTE — Progress Notes (Signed)
Subjective Had a good day yesterday but tiring. Some agitation in the afternoon. Denies nausea or abdominal pain.   Objective: Vital signs in last 24 hours: Temp:  [97.5 F (36.4 C)-97.7 F (36.5 C)] 97.6 F (36.4 C) (08/26 0402) Pulse Rate:  [84-97] 88 (08/26 0600) Resp:  [17-30] 19 (08/26 0600) BP: (99-190)/(53-115) 109/73 (08/26 0600) SpO2:  [92 %-100 %] 99 % (08/26 0737) FiO2 (%):  [36 %] 36 % (08/25 0835) Last BM Date: 06/24/21  Intake/Output from previous day: 08/25 0701 - 08/26 0700 In: 1577.4 [P.O.:400; I.V.:524.4; IV Piggyback:653] Out: 750 [Urine:600; Stool:150] Intake/Output this shift: No intake/output data recorded.  Gen: Alert, cooperative CV: RRR Pulm: unlabored, sat 99% on Remy Abd: Soft, nondistended, minimally tender. Midline wound VAC in place with serosanguineous output stoma viable, edema much improved, patent, about 200 cc of liquid stool in the bag as well as air Ext: SCDs in place  Lab Results: CBC  Recent Labs    06/28/21 0244 06/29/21 0253  WBC 10.2 11.5*  HGB 8.9* 9.8*  HCT 28.9* 32.4*  PLT 338 406*    BMET Recent Labs    06/28/21 0244 06/29/21 0253  NA 137 135  K 3.2* 4.0  CL 95* 96*  CO2 33* 31  GLUCOSE 102* 118*  BUN 7* 9  CREATININE 0.55* 0.60*  CALCIUM 7.7* 8.1*    PT/INR No results for input(s): LABPROT, INR in the last 72 hours. ABG Recent Labs    06/29/21 0417  PHART 7.375  HCO3 34.3*     Studies/Results:  Anti-infectives: Anti-infectives (From admission, onward)    Start     Dose/Rate Route Frequency Ordered Stop   06/18/2021 1400  piperacillin-tazobactam (ZOSYN) IVPB 3.375 g        3.375 g 12.5 mL/hr over 240 Minutes Intravenous Every 8 hours 06/07/2021 1357 06/27/21 1016   06/13/2021 0745  cefoTEtan (CEFOTAN) 2 g in sodium chloride 0.9 % 100 mL IVPB        2 g 200 mL/hr over 30 Minutes Intravenous On call to O.R. 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  Extubated 8/22.  Some sundowning, precedex stopped 8/25. -Multimodal pain control -zosyn 5d course ended 8/24 -on D3 diet, please see SLP recommendations.;  -Appreciate assistance from [place], vac change today -Ppx: SCDs; PPI; SQH started 8/21 - I discussed the pathology results with his wife earlier this week.. T4N1 obstructing rectosigmoid cancer.  Dr. Johney Maine has initiated arrangements for GI  tumor board and outpatient oncology follow-up.  Needs to work on conditioning/mobilizing and nutrition. Would keep stepdown for today given agitation and BP issues. Possible floor this weekend if continued improvement.    LOS: 7 days   [facility] Use [email or web address] to contact on call provider

## 2021-06-29 NOTE — Progress Notes (Signed)
Occupational Therapy Treatment Patient Details Name: Daniel King MRN: [id number] DOB: 26-Nov-1945 Today's Date: 06/29/2021    History of present illness Patient is a 75 year old male who presented with two day history of abdomenal pain and discomfort. patient's CT was concerning for preforation. 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 patient was also found to have septic shock, acute respiratory failure and blood loss anemia. patient was extubated on 8/22.   PMH: COPD, A fib,h/o CVA   OT comments  Patient agreeable to get up to chair for lunch. Patient with delayed initiation of mobility needing cues for follow through and mod A x1-2 for bed mobility including bringing legs of edge of bed and to upright trunk. Patient initially able to hold static balance but fatigues quickly leaning to R side. Patient needing mod A x2 with lower extremity buckling noted with stand pivot to recliner chair. VSS on 4L. Will continue to follow.    Follow Up Recommendations  SNF    Equipment Recommendations  None recommended by OT       Precautions / Restrictions Precautions Precautions: Fall Precaution Comments: abdominal wound VAC, ileostomy, monitor BP and HR       Mobility Bed Mobility Overal bed mobility: Needs Assistance Bed Mobility: Supine to Sit     Supine to sit: Mod assist;+2 for safety/equipment;+2 for physical assistance;HOB elevated     General bed mobility comments: increased time with tactile cues to bring legs to edge of bed, mod A x1-2 for trunk support    Transfers Overall transfer level: Needs assistance Equipment used: 2 person hand held assist Transfers: Sit to/from [facility] Sit to Stand: Mod assist;+2 physical assistance;+2 safety/equipment Stand pivot transfers: Mod assist;+2 physical assistance;+2 safety/equipment       General transfer comment: initially able to support  weight, note lower extremity buckling while pivoting to chair needing mod x2 for support    Balance Overall balance assessment: Needs assistance Sitting-balance support: Feet supported;Single extremity supported;Bilateral upper extremity supported Sitting balance-Leahy Scale: Poor Sitting balance - Comments: fatigues quickly leaning onto R forearm Postural control: Right lateral lean Standing balance support: Bilateral upper extremity supported Standing balance-Leahy Scale: Zero Standing balance comment: reliant on mod x2, LE buckling                           ADL either performed or assessed with clinical judgement   ADL Overall ADL's : Needs assistance/impaired                     Lower Body Dressing: Total assistance;Bed level Lower Body Dressing Details (indicate cue type and reason): don socks Toilet Transfer: Moderate assistance;+2 for physical assistance;+2 for safety/equipment;Cueing for safety;Stand-pivot Toilet Transfer Details (indicate cue type and reason): to recliner, note lower extremity buckling and difficulty supporting weight needing mod x2 to complete pivot to chair. limited eccentric control with sitting, fatigues quickly         Functional mobility during ADLs: Moderate assistance;+2 for physical assistance;+2 for safety/equipment        Cognition Arousal/Alertness: Awake/alert Behavior During Therapy: Flat affect Overall Cognitive Status: Impaired/Different from baseline                         Following Commands: Follows one step commands with increased time       General Comments: patient needing increased time for all  mobility and initiating directions, may be related to fatigue              General Comments VSS on 4L O2    Pertinent Vitals/ Pain       Pain Assessment: Faces Faces Pain Scale: Hurts little more Pain Location: lower abdomen/wound vac site Pain Descriptors / Indicators: Grimacing Pain  Intervention(s): Monitored during session         Frequency  Min 2X/week        Progress Toward Goals  OT Goals(current goals can now be found in the care plan section)  Progress towards OT goals: Progressing toward goals  Acute Rehab OT Goals Patient Stated Goal: home OT Goal Formulation: With patient/family Time For Goal Achievement: 07/11/21 Potential to Achieve Goals: Fair ADL Goals Pt Will Perform Grooming: with supervision;with set-up;sitting Pt Will Transfer to Toilet: with min assist;stand pivot transfer;bedside commode Pt/caregiver will Perform Home Exercise Program: Both right and left upper extremity;With theraband;With written HEP provided;With Supervision Additional ADL Goal #1: Patient will perform 10 min functional activity or exercise activity as evidence of improving activity tolerance  Plan Discharge plan remains appropriate    Co-evaluation    PT/OT/SLP Co-Evaluation/Treatment: Yes Reason for Co-Treatment: For patient/therapist safety;To address functional/ADL transfers PT goals addressed during session: Mobility/safety with mobility OT goals addressed during session: ADL's and self-care      AM-PAC OT "6 Clicks" Daily Activity     Outcome Measure   Help from another person eating meals?: A Lot Help from another person taking care of personal grooming?: A Lot Help from another person toileting, which includes using toliet, bedpan, or urinal?: A Lot Help from another person bathing (including washing, rinsing, drying)?: A Lot Help from another person to put on and taking off regular upper body clothing?: A Lot Help from another person to put on and taking off regular lower body clothing?: Total 6 Click Score: 11    End of Session Equipment Utilized During Treatment: Gait belt  OT Visit Diagnosis: Other abnormalities of gait and mobility (R26.89);Unsteadiness on feet (R26.81);Muscle weakness (generalized) (M62.81)   Activity Tolerance Patient  limited by fatigue   Patient Left in chair;with call bell/phone within reach;with chair alarm set;with family/visitor present   Nurse Communication Mobility status        Time: 1206-1227 OT Time Calculation (min): 21 min  Charges: OT General Charges $OT Visit: 1 Visit OT Treatments $Self Care/Home Management : 8-22 mins  Daniel Phenix OT OT pager: [place] 06/29/2021, 12:49 PM

## 2021-06-29 NOTE — Progress Notes (Addendum)
NAME:  Daniel King, MRN:  [id number], DOB:  1946/05/07, LOS: 7 ADMISSION DATE:  06/24/2021, CONSULTATION DATE:  8/19 REFERRING MD:  Dr. Johney Maine, CHIEF COMPLAINT:  Post-operative respiratory failure    bRIEF - also MRN duplicate is MRN:  [id number]  75 y/o M who presented to [facility] on 8/19 with reports of 24 hours of abdominal pain that worsened the am of presentation.   He reported his last BM on 8/18, prior to that was one week ago.  On presentation he was note to have abdominal distention, tenderness.  CT of the abdomen was assessed with concerns for primary colon cancer in the mid sigmoid colon complicated by colonic obstruction with pneumatosis involving the cecum and ascending colon, consistent with ischemic colitis. Evidence of bowel perforation with small volume pneumoperitoneum.  He was evaluated by CCS and recommended for open colectomy / ileostomy.  He was on plavix prior to admit.  He was started on IV zosyn.  Intraoperative findings notable for massively dilated colon, cecum and ascending colon with ischemia and patches of phlegmon & gangrene with approximately 3 feet of colon removed, small volume pneumoperitoneum, rocky hard rectosigmoid mass worrisome for malignancy.  EBL estimated at 151m.  He had intraoperative hypotension requiring vasopressors.  The patient was returned to the ICU post surgery on mechanical ventilation. Post op Hgb 7.6.  The patient was given 1 unit PRBC.    PCCM consulted for assistance with ICU care.    Pertinent  Medical History  COPD  -  Managed by [facility] Dr. CGwenette Greet.  Smoked 60 yr 1.593yr Recently 2 PPD , quit at discharge  Chronic Hypoxic and hypercapic Respiratory Failure - on home O2 and Trilogy Atrial fibrillation  CVA - on plavix aspirin =- Prior admission , sedentary due to CVA in October 2021, low activity level and balance issues.  THC use [place] abusle - quit summer 2022 AAA: Previous vascular interventions: EVAR 10/04/2016 by Dr.  VeTheodis Satowith relining of graft and extension of right limb due to type I endoleak. I&D of right groin for infection with wound vac placement 11/07/2016.   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   in other chart with MRN [id number] has hypercapnia - [facility] 05/10/21 and reported wearing trilogy 2h  per night at home   Significant Hospital Events: Including procedures, antibiotic start and stop dates in addition to other pertinent events   8/19 admit with abd pain, CT with concern for perforation to OR for ex-lap with abdominal colectomy, colostomy. Returned to ICU on vent.  MRSA PCR positive COVID PCR negative Flu PCR negative Left IJ 8/19  Foley 8/19 > 8/22 ETT 8/19  >>8/22 06/24/21 - NAEON. UOP a bit better, still relatively oliguric. Cr stable. Alert, on SBT. Anticipate extubation. 06/25/21 : Failed SBT yesterday.  Lasix given.  Yesterday afebrile.  White count down to 11 point 9K.  Remains on ventilator 40% FiO2.  On fentanyl infusion, Precedex infusion, Levophed infusion off.  Only on vasopressin infusion.  Getting magnesium sulfate and potassium.  Remains on Zosyn.  Surgery noted that his oliguria is improving and creatinine normalized and to anticipate third space losses given colectomy -extubated 8/23 remains extubated. Hypercapnic. Did not do much BiPAP due to NG tube. On amio gtt and rate controlled . This AM - agitated and confused  and hallucinating despite precedex gtt. Wheezing. Chart review shows severe Protein cal malnutn pre admit alb 2.,2. EKG now with NSR - QTc 511 mosec. On precedex, amio gtt, comparize prn.  Amiodarone discontinued due to prolonged QTC echocardiogram ordered.LVEF 60%.  Normal function.  Mild left ventricular hypertrophy RV function mildly reduced 8/24: Still confused.  Still on Precedex drip.  NG tube removed per surgery.  Modified  barium swallow ordered. 8/25 Precedex stopped.  No longer confused.  Slowly advancing diet.  Barium swallow did demonstrate mild dysphagia.  Recommendations made by SLP.  Fairly hypertensive, Norvasc increased, as needed hydralazine. .  No longer needs critical care we will ask medicine to continue to provide support.  Internal medicine contacted and asked to assume primary consultant duties for medical needs, pulmonary following for specific pulmonary problems.  Given his advanced lung disease and malignancy we did ask palliative care to see the patient in consult to assist with goals of care  Interim History / Subjective:  No distress. He likes the regular BIPAP more than dream station but tolerated it just the same  Objective   Blood pressure 109/73, pulse 88, temperature 97.6 F (36.4 C), temperature source Axillary, resp. rate 19, height 6' (1.829 m), weight 66.4 kg, SpO2 99 %.    FiO2 (%):  [36 %] 36 %   Intake/Output Summary (Last 24 hours) at 06/29/2021 0805 Last data filed at 06/29/2021 0400 Gross per 24 hour  Intake 1177.35 ml  Output 750 ml  Net 427.35 ml   Filed Weights   06/20/2021 0356 06/23/21 0456 06/24/21 0500  Weight: 68 kg 67.9 kg 66.4 kg    Examination: General this is a 75 year old male patient who is resting comfortably in bed this morning visiting with his wife HEENT normocephalic atraumatic no jugular venous distention appreciated Pulmonary: Diffuse expiratory wheezing but actually better air movement when comparing exam from yesterday he is in no distress this morning Cardiac: Regular irregular with atrial fibrillation noted on telemetry Abdomen soft not tender wound unremarkable Extremities: Warm dry brisk capillary refill Neuro he is awake alert and cooperative today no motor deficits appreciated GU: Voids spontaneously  Resolved Hospital Problem list   Hypovolemic shock  Post-operative respiratory failure ->extubated 8/22 Acute Kidney Injury (POA)  resolved Assessment & Plan:   Acute Bowel Perforation w/ Pneumatosis; 2/2 sigmoid stricture/obstruction due to T4N1 obstructing rectosigmoid cancer . Now s/p Open Colectomy with Ileostomy - present on  Chronic Hypoxic & hypercarbic respiratory failure 2/2 COPD w/ on-going tobacco abuse  Current every day smoker - 60ppd hx - present on admit Acute metabolic encephalopathy -new onset 06/26/2021 A. fib RVR 06/25/2021.  Prolonged QTC 511 ms-new 06/26/2021 HTN Anemia of blood loss and critical illness  Intermittent fluid and Electrolyte imbalance: hypophosphatemia, hypokalemia Hx Prostate Cancer  stage 4 micrometastic - baseline prior to and on admit Hx of bladder cancer in 2006 - early per patient on 06/28/2021 (MD add) History of chronic HLD Hx CVA October 2021- on DAPT prior to admit Severe protein calorie Malnutrition w/ cachexia (POA) Dysphagia, identified by modified barium swallow on 8/24 Failure to thrive   Pulm prob list  Chronic hypoxic and hypercarbic respiratory failure in the context of advanced chronic obstructive pulmonary disease and ongoing tobacco abuse Limited pulmonary reserve, actually on nocturnal CPAP at baseline due to advanced lung disease and pulmonary deconditioning Is actually doing quite well from a pulmonary standpoint.  His primary issues in the intensive care have been delirium, which is improved, and  postoperative hypertension.  Overall however his postoperative course has gone as well as could be expected given his comorbidities.  I have asked the internal medicine service to assume his care in regards to hypertension, arrhythmia, electrolyte management.  We are still going to follow intermittently for his underlying lung disease  Plan Continue supplemental oxygen, with nocturnal while here in the intensive care Continue scheduled bronchodilators, and inhaled corticosteroids (he is on Stiolto respimat at home.  Adding flutter valve Maximize mobility Dysphagia  precautions given risk for aspiration Be careful with narcotics   Best Practice (right click and "Reselect all SmartList Selections" daily)  Diet/type: clear liquids and NPO DVT prophylaxis: prophylactic heparin  GI prophylaxis: PPI Lines: N/A - L IJ 8/19  Foley:  Placed8/19 - remove 8/22 Code Status:  full code  Last date of multidisciplinary goals of care discussion - by CCS for 06/25/21. Wife update 06/26/21 Pulmonary will be available on an as-needed basis and will see again on Monday 8/29  Erick Colace ACNP-BC [place] Pager # [phone] OR # [phone] if no answer    Attending:    Subjective: Feels like he is making progress Using CPAP qHS  Objective: Vitals:   06/29/21 0800 06/29/21 0850 06/29/21 0900 06/29/21 0903  BP: (!) 149/62  (!) 208/120 (!) 147/57  Pulse: 94  84 89  Resp: (!) 24     Temp: 97.8 F (36.6 C) 97.8 F (36.6 C)    TempSrc: Oral Oral    SpO2: 100%  92% 100%  Weight:      Height:          Intake/Output Summary (Last 24 hours) at 06/29/2021 1022 Last data filed at 06/29/2021 0800 Gross per 24 hour  Intake 871.53 ml  Output 900 ml  Net -28.47 ml    General:  Resting comfortably in bed HENT: NCAT OP clear PULM: some wheezing, poor air movement B, normal effort CV: Irreg irreg, no mgr GI: midline scar MSK: normal bulk and tone Neuro: awake, alert, no distress, MAEW    CBC    Component Value Date/Time   WBC 11.5 (H) 06/29/2021 0253   RBC 3.60 (L) 06/29/2021 0253   HGB 9.8 (L) 06/29/2021 0253   HCT 32.4 (L) 06/29/2021 0253   PLT 406 (H) 06/29/2021 0253   MCV 90.0 06/29/2021 0253   MCH 27.2 06/29/2021 0253   MCHC 30.2 06/29/2021 0253   RDW 17.8 (H) 06/29/2021 0253   LYMPHSABS 0.5 (L) 06/09/2021 0406   MONOABS 1.3 (H) 06/19/2021 0406   EOSABS 0.0 06/30/2021 0406   BASOSABS 0.0 06/06/2021 0406    BMET    Component Value Date/Time   NA 135 06/29/2021 0253   K 4.0 06/29/2021 0253   CL 96 (L) 06/29/2021  0253   CO2 31 06/29/2021 0253   GLUCOSE 118 (H) 06/29/2021 0253   BUN 9 06/29/2021 0253   CREATININE 0.60 (L) 06/29/2021 0253   CALCIUM 8.1 (L) 06/29/2021 0253   GFRNONAA >60 06/29/2021 0253    No new chest imaging  Impression/Plan: Chronic hypoxemic and hypercarbic respiratory failure in setting of severe COPD with limited pulmonary reserve after abdominal surgery: he is making much improvement, continue oxygen, bronchodilators, flutter valve and CPAP qHS  Will see again on Monday or over weekend.     Roselie Awkward, MD [place] [facility] Pager: [phone] Cell: [phone] After 7pm: [phone]

## 2021-06-30 LAB — PHOSPHORUS: Phosphorus: 2.9 mg/dL (ref 2.5–4.6)

## 2021-06-30 LAB — COMPREHENSIVE METABOLIC PANEL
ALT: 35 U/L (ref 0–44)
AST: 13 U/L — ABNORMAL LOW (ref 15–41)
Albumin: 2.4 g/dL — ABNORMAL LOW (ref 3.5–5.0)
Alkaline Phosphatase: 64 U/L (ref 38–126)
Anion gap: 6 (ref 5–15)
BUN: 11 mg/dL (ref 8–23)
CO2: 33 mmol/L — ABNORMAL HIGH (ref 22–32)
Calcium: 8.4 mg/dL — ABNORMAL LOW (ref 8.9–10.3)
Chloride: 97 mmol/L — ABNORMAL LOW (ref 98–111)
Creatinine, Ser: 0.61 mg/dL (ref 0.61–1.24)
GFR, Estimated: >60 mL/min (ref >60)
Glucose, Bld: 86 mg/dL (ref 70–99)
Potassium: 4.1 mmol/L (ref 3.5–5.1)
Sodium: 136 mmol/L (ref 135–145)
Total Bilirubin: 0.9 mg/dL (ref 0.3–1.2)
Total Protein: 5.2 g/dL — ABNORMAL LOW (ref 6.5–8.1)

## 2021-06-30 LAB — GLUCOSE, CAPILLARY
Glucose-Capillary: 101 mg/dL — ABNORMAL HIGH (ref 70–99)
Glucose-Capillary: 117 mg/dL — ABNORMAL HIGH (ref 70–99)
Glucose-Capillary: 80 mg/dL (ref 70–99)
Glucose-Capillary: 85 mg/dL (ref 70–99)
Glucose-Capillary: 87 mg/dL (ref 70–99)
Glucose-Capillary: 94 mg/dL (ref 70–99)

## 2021-06-30 LAB — CBC WITH DIFFERENTIAL/PLATELET
Abs Immature Granulocytes: 0.58 10*3/uL — ABNORMAL HIGH (ref 0.00–0.07)
Basophils Absolute: 0.1 10*3/uL (ref 0.0–0.1)
Basophils Relative: 1 %
Eosinophils Absolute: 0.3 10*3/uL (ref 0.0–0.5)
Eosinophils Relative: 3 %
HCT: 29.3 % — ABNORMAL LOW (ref 39.0–52.0)
Hemoglobin: 9 g/dL — ABNORMAL LOW (ref 13.0–17.0)
Immature Granulocytes: 5 %
Lymphocytes Relative: 4 %
Lymphs Abs: 0.5 10*3/uL — ABNORMAL LOW (ref 0.7–4.0)
MCH: 27.4 pg (ref 26.0–34.0)
MCHC: 30.7 g/dL (ref 30.0–36.0)
MCV: 89.3 fL (ref 80.0–100.0)
Monocytes Absolute: 1.3 10*3/uL — ABNORMAL HIGH (ref 0.1–1.0)
Monocytes Relative: 11 %
Neutro Abs: 9 10*3/uL — ABNORMAL HIGH (ref 1.7–7.7)
Neutrophils Relative %: 76 %
Platelets: 389 10*3/uL (ref 150–400)
RBC: 3.28 MIL/uL — ABNORMAL LOW (ref 4.22–5.81)
RDW: 17.9 % — ABNORMAL HIGH (ref 11.5–15.5)
WBC: 11.8 10*3/uL — ABNORMAL HIGH (ref 4.0–10.5)
nRBC: 0.4 % — ABNORMAL HIGH (ref 0.0–0.2)

## 2021-06-30 LAB — MAGNESIUM: Magnesium: 1.9 mg/dL (ref 1.7–2.4)

## 2021-06-30 MED ORDER — FOOD THICKENER (SIMPLYTHICK)
1.0000 | ORAL | Status: DC | PRN
  Filled 2021-06-30: qty 1

## 2021-06-30 MED ORDER — CHLORHEXIDINE GLUCONATE 0.12 % MT SOLN
OROMUCOSAL | Status: AC
  Filled 2021-06-30: qty 15

## 2021-06-30 MED ORDER — SIMETHICONE 80 MG PO CHEW
80.0000 mg | CHEWABLE_TABLET | Freq: Once | ORAL | Status: AC
  Administered 2021-06-30: 80 mg via ORAL
  Filled 2021-06-30: qty 1

## 2021-06-30 MED ORDER — CHLORHEXIDINE GLUCONATE 0.12 % MT SOLN
OROMUCOSAL | Status: AC
  Administered 2021-06-30: 15 mL via OROMUCOSAL
  Filled 2021-06-30: qty 15

## 2021-06-30 MED ORDER — IPRATROPIUM-ALBUTEROL 0.5-2.5 (3) MG/3ML IN SOLN
3.0000 mL | RESPIRATORY_TRACT | Status: DC
  Administered 2021-06-30 – 2021-07-05 (×18): 3 mL via RESPIRATORY_TRACT
  Filled 2021-06-30 (×19): qty 3

## 2021-06-30 NOTE — Progress Notes (Signed)
Subjective No complaints this morning.  Some abdominal soreness.  Still mild confusion (asked me to put something down that was in his hands - he had nothing in his hands)  Objective: Vital signs in last 24 hours: Temp:  [97.5 F (36.4 C)-98.1 F (36.7 C)] 98 F (36.7 C) (08/27 0400) Pulse Rate:  [71-96] 75 (08/27 0700) Resp:  [13-27] 13 (08/27 0700) BP: (91-223)/(47-191) 169/84 (08/27 0700) SpO2:  [86 %-100 %] 100 % (08/27 0700) Weight:  [77.1 kg] 77.1 kg (08/27 0423) Last BM Date: 06/30/21  Intake/Output from previous day: 08/26 0701 - 08/27 0700 In: 597.8 [P.O.:340; I.V.:257.8] Out: 950 [Urine:550; Stool:400] Intake/Output this shift: No intake/output data recorded.  Gen: Alert, cooperative CV: RRR Pulm: unlabored, sat 99% on [place] Abd: Soft, nondistended, minimally tender. Midline wound VAC in place with serosanguineous output stoma viable, edema much improved, patent, about 200 cc of liquid stool in the bag as well as air Ext: SCDs in place  Lab Results: CBC  Recent Labs    06/29/21 0253 06/30/21 0303  WBC 11.5* 11.8*  HGB 9.8* 9.0*  HCT 32.4* 29.3*  PLT 406* 389    BMET Recent Labs    06/29/21 0253 06/30/21 0303  NA 135 136  K 4.0 4.1  CL 96* 97*  CO2 31 33*  GLUCOSE 118* 86  BUN 9 11  CREATININE 0.60* 0.61  CALCIUM 8.1* 8.4*    PT/INR No results for input(s): LABPROT, INR in the last 72 hours. ABG Recent Labs    06/29/21 0417  PHART 7.375  HCO3 34.3*     Studies/Results:  Anti-infectives: Anti-infectives (From admission, onward)    Start     Dose/Rate Route Frequency Ordered Stop   06/20/2021 1400  piperacillin-tazobactam (ZOSYN) IVPB 3.375 g        3.375 g 12.5 mL/hr over 240 Minutes Intravenous Every 8 hours 06/24/2021 1357 06/27/21 1016   07/01/2021 0745  cefoTEtan (CEFOTAN) 2 g in sodium chloride 0.9 % 100 mL IVPB        2 g 200 mL/hr over 30 Minutes Intravenous On call to O.R. 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  Some sundowning, precedex stopped 8/25. -Multimodal pain control -zosyn 5d course ended 8/24 -on D3 diet - okay for diet as tolerated per SLP recommendations -Appreciate assistance from WOCN, vac change today -Ppx: SCDs; PPI; SQH started 8/21 - I discussed the pathology results with his wife earlier this week.Marland Kitchen  T4N1 obstructing rectosigmoid cancer.  Dr. Johney Maine has initiated arrangements for GI tumor board and outpatient oncology follow-up.  Needs to work on conditioning/mobilizing and nutrition.    LOS: 8 days   [facility] Use [email or web address] to contact on call provider

## 2021-06-30 NOTE — Consult Note (Signed)
Consultation Note Date: 06/30/2021   Patient Name: Daniel King  DOB: 15-Apr-1946  MRN: [id number]  Age / Sex: 75 y.o., male  PCP: Henreitta Cea, MD Referring Physician: Edison Pace, Md, MD  Reason for Consultation: Establishing goals of care  HPI/Patient Profile: 75 y.o. male  with past medical history of COPD, A. fib, CVA, memory impairment admitted on 06/20/2021 with CT concerning for colon cancer, bowel perforation with pathology results consistent with rectosigmoid tumor.   Clinical Assessment and Goals of Care: Chart reviewed including personal review of pertinent labs and imaging.  I reviewed recent encounter with [name] from our team (this is recorded in chart marked for merge) where MOST form was completed with desire for continued aggressive care.  I saw and examined Daniel King today.  He is lying in bed in no distress.  He was awake and alert and appropriately conversational but I am not sure he really has insight into his medical conditions.  His wife is not present at time of my encounter.    When asked his understanding of the situation, he reports that he has been told what is going on but really cannot verbalize to me anything that he been told to him.    He tells me that his wife is who helps to make decisions.  SUMMARY OF RECOMMENDATIONS   - Full Code/Full Scope -Daniel King has a chart merge pending.  His other chart has MOST form that was completed in June that outlines plan for desire for CPR, full scope treatment, antibiotics if indicated, IV fluids if indicated, trial of feeding tube.  He reported today that his desire remains the same, however, I am not sure he has insight into his situation.   -Palliative will continue to follow and attempt to meet with his wife to continue conversation and review desires for care in light of changes including confirmation of adenocarcinoma of the  colon.  However, as plan is for his case to be discussed at tumor board and follow-up with an outpatient oncologist, and I think there is high likelihood this will need to occur prior to any significant change in discussions regarding goals of care.  -Recommend outpatient palliative care to follow to continue conversation with patient and wife once results of tumor board and meeting with oncology are available  Code Status/Advance Care Planning: Full code   Palliative Prophylaxis:  Frequent Pain Assessment  Additional Recommendations (Limitations, Scope, Preferences): Full Scope Treatment  Psycho-social/Spiritual:  Desire for further Chaplaincy support:no Additional Recommendations: Caregiving  Support/Resources  Prognosis:  Unable to determine  Discharge Planning: To Be Determined      Primary Diagnoses: Present on Admission:  Perforated bowel ([place])  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 The following aspects are pertinent.  Past Medical History:  Diagnosis Date   COPD (chronic obstructive pulmonary disease) (HCC)    Social History   Socioeconomic History   Marital status: Married    Spouse name: Not on file   Number of children: Not on file   Years of education: Not on file   Highest education level: Not on file  Occupational History   Not on file  Tobacco Use   Smoking status: Every Day    Packs/day: 0.25    Years: 60.00    Pack years: 15.00    Types: Cigarettes   Smokeless tobacco: Never  Vaping Use   Vaping Use: Never used  Substance and Sexual Activity   Alcohol use: Never   Drug use: Not Currently    Types: Marijuana   Sexual activity: Yes  Other Topics Concern    Not on file  Social History Narrative   Not on file   Social Determinants of Health   Financial Resource Strain: Not on file  Food Insecurity: Not on file  Transportation Needs: Not on file  Physical Activity: Not on file  Stress: Not on file  Social Connections: Not on file   History reviewed. No pertinent family history. Scheduled Meds:  amLODipine  10 mg Oral Daily   aspirin EC  81 mg Oral Daily   budesonide (PULMICORT) nebulizer solution  0.5 mg Nebulization BID   chlorhexidine gluconate (MEDLINE KIT)  15 mL Mouth Rinse BID   Chlorhexidine Gluconate Cloth  6 each Topical Daily   clopidogrel  75 mg Oral Daily   feeding supplement  1 Container Oral TID BM   folic acid  1 mg Oral Daily   heparin injection (subcutaneous)  5,000 Units Subcutaneous Q8H   insulin aspart  0-9 Units Subcutaneous Q4H   ipratropium-albuterol  3 mL Nebulization QID   lip balm  1 application Topical BID   metoprolol tartrate  12.5 mg Oral BID   pantoprazole  40 mg Oral Daily   sodium chloride flush  10-40 mL Intracatheter Q12H   thiamine  100 mg Oral Daily   Continuous Infusions:  sodium chloride 10 mL/hr at 06/29/21 1654   methocarbamol (ROBAXIN) IV     PRN Meds:.acetaminophen, food thickener, hydrALAZINE, HYDROmorphone (DILAUDID) injection, magic mouthwash, methocarbamol (ROBAXIN) IV, ondansetron (ZOFRAN) IV, oxyCODONE Medications Prior to Admission:  Prior to Admission medications   Medication Sig Start Date End Date Taking? Authorizing Provider  acetaminophen (TYLENOL) 500 MG tablet Take 500 mg by mouth every 6 (six) hours as needed for mild pain, fever or headache.   Yes [provider]  albuterol (VENTOLIN HFA) 108 (90 Base) MCG/ACT inhaler Inhale 1-2 puffs into the lungs every 6 (six) hours as needed for wheezing or shortness of breath.   Yes [provider]  alendronate (FOSAMAX) 70 MG tablet Take 70 mg by mouth once a week. Take with a full glass of water on an empty  stomach.   Yes [provider]  amLODipine (NORVASC) 5 MG tablet Take 5 mg by mouth 2 (two) times daily.   Yes [provider]  aspirin EC 81 MG tablet Take 81 mg by mouth daily. Swallow whole.   Yes [provider]  atorvastatin (LIPITOR) 40 MG tablet Take 40 mg by mouth daily.   Yes [provider]  Cholecalciferol (VITAMIN D3) 50 MCG (2000 UT) capsule Take 2,000 Units by mouth daily.   Yes [provider]  clopidogrel (PLAVIX) 75 MG tablet Take 75 mg by mouth daily.   Yes  [provider]  docusate sodium (COLACE) 100 MG capsule Take 100 mg by mouth 2 (two) times daily as needed for mild constipation.   Yes [provider]  ferrous sulfate 325 (65 FE) MG tablet Take 325 mg by mouth 3 (three) times a week. Monday, Wednesday, Friday   Yes [provider]  lisinopril (ZESTRIL) 20 MG tablet Take 20 mg by mouth in the morning and at bedtime.   Yes [provider]  metoprolol tartrate (LOPRESSOR) 25 MG tablet Take 12.5 mg by mouth 2 (two) times daily.   Yes [provider]  Tiotropium Bromide-Olodaterol (STIOLTO RESPIMAT) 2.5-2.5 MCG/ACT AERS Inhale 1 puff into the lungs daily.   Yes [provider]  vitamin C (ASCORBIC ACID) 500 MG tablet Take 500 mg by mouth daily.   Yes [provider]  XTANDI 40 MG tablet Take 160 mg by mouth daily. 03/06/21  Yes [provider]   No Known Allergies Review of Systems  Constitutional:  Positive for activity change and fatigue.  Neurological:  Positive for weakness.  Psychiatric/Behavioral:  Positive for sleep disturbance.    Physical Exam General: Alert, awake, in no acute distress.  Frail and chronically ill-appearing HEENT: No bruits, no goiter, no JVD Heart: Regular rate and rhythm. No murmur appreciated. Lungs: Decreased air movement, scattered coarse Abdomen: Soft, nontender, nondistended, positive bowel sounds.   Ext: No significant  edema Skin: Warm and dry Neuro: Grossly intact, nonfocal.   Vital Signs: BP 94/65   Pulse 76   Temp 97.6 F (36.4 C) (Oral)   Resp 16   Ht 6' (1.829 m)   Wt 77.1 kg   SpO2 93%   BMI 23.05 kg/m  Pain Scale: 0-10   Pain Score: 8    SpO2: SpO2: 93 % O2 Device:SpO2: 93 % O2 Flow Rate: .O2 Flow Rate (L/min): 2 L/min  IO: Intake/output summary:  Intake/Output Summary (Last 24 hours) at 06/30/2021 1048 Last data filed at 06/30/2021 1610 Gross per 24 hour  Intake 507.78 ml  Output 800 ml  Net -292.22 ml    LBM: Last BM Date: 06/30/21 Baseline Weight: Weight: 68 kg Most recent weight: Weight: 77.1 kg     Palliative Assessment/Data:   Flowsheet Rows    Flowsheet Row Most Recent Value  Intake Tab   Referral Department Hospitalist  Unit at Time of Referral ICU  Palliative Care Primary Diagnosis Cancer  Date Notified 06/28/21  Palliative Care Type Return patient Palliative Care  Reason for referral Clarify Goals of Care  Date of Admission 07/02/2021  Date first seen by Palliative Care 06/29/21  # of days Palliative referral response time 1 Day(s)  # of days IP prior to Palliative referral 6  Clinical Assessment   Palliative Performance Scale Score 40%  Psychosocial & Spiritual Assessment   Palliative Care Outcomes   Patient/Family meeting held? Yes  Who was at the meeting? Patient       Time In: 945 Time Out: 1045 Time Total: 60 Greater than 50%  of this time was spent counseling and coordinating care related to the above assessment and plan.  Signed by: Micheline Rough, MD   Please contact Palliative Medicine Team phone at [phone] for questions and concerns.  For individual provider: See Shea Evans

## 2021-06-30 NOTE — Progress Notes (Signed)
Pt again refusing to wear BiPAP qhs.  Pt wore BiPAP for about 20 minutes last night and refuses to try it at all tonight.  Pt educated that he needs to wear BiPAP qhs but refused.  RN agreeable to contact RT should he change his mind or his condition worsen.

## 2021-06-30 NOTE — Progress Notes (Signed)
PROGRESS NOTE    Daniel King  ZHY:[id number] DOB: Mar 31, 1946 DOA: 06/30/2021 PCP: Henreitta Cea, MD   Brief Narrative:  Patient is a 74 year old Caucasian male who presented with 24 hours of worsening abdominal pain on 06/30/2021 with a last bowel movement on 818.  He was noted to have abdominal distention and tenderness.  CT of the abdomen was done and assessed with concern for primary colon cancer in the mid sigmoid colon complicated by colonic obstruction with pneumatosis involving the cecum and ascending colon, assistant with ischemic colitis.  He then had evidence of bowel perforation with small volume pneumoperitoneum.  He was evaluated by the general surgery team and recommended an open colectomy and ileostomy.  He was on Plavix prior to admission he was started on IV Zosyn.  Intraoperative findings were notable for massively dilated colon, cecum and ascending colon with ischemia and patches of phlegmon and gangrene of approximately 3 feet of colon was removed.  There was small volume pneumoperitoneum and rocky hard rectosigmoid mass worrisome for malignancy.  He had intraoperative hypotension requiring vasopressors return to ICU postsurgery on mechanical ventilation and postop hemoglobin was 7.6 was given 1 unit PRBCs.  PCCM was consulted for assistance with ICU care and then subsequently patient was extubated on 06/26/2021.  He has some postoperative confusion and delirium and was still confused but was placed on Precedex drip.  06/28/2021 Precedex drip was stopped and SLP evaluated and noted mild dysphagia.  He was switched over to [facility] managing multiple medical issues on 06/29/2021.  Currently is clinical course is been the following: 8/19 admit with abd pain, CT with concern for perforation to OR for ex-lap with abdominal colectomy, colostomy. Returned to ICU on vent.  MRSA PCR positive COVID PCR negative Flu PCR negative Left IJ 8/19  Foley 8/19 > 8/22 ETT 8/19   >>8/22 06/24/21 - NAEON. UOP a bit better, still relatively oliguric. Cr stable. Alert, on SBT. Anticipate extubation. 06/25/21 : Failed SBT yesterday.  Lasix given.  Yesterday afebrile.  White count down to 11 point 9K.  Remains on ventilator 40% FiO2.  On fentanyl infusion, Precedex infusion, Levophed infusion off.  Only on vasopressin infusion.  Getting magnesium sulfate and potassium.  Remains on Zosyn.  Surgery noted that his oliguria is improving and creatinine normalized and to anticipate third space losses given colectomy -extubated 8/23 remains extubated. Hypercapnic. Did not do much BiPAP due to NG tube. On amio gtt and rate controlled . This AM - agitated and confused and hallucinating despite precedex gtt. Wheezing. Chart review shows severe Protein cal malnutn pre admit alb 2.,2. EKG now with NSR - QTc 511 mosec. On precedex, amio gtt, comparize prn.  Amiodarone discontinued due to prolonged QTC echocardiogram ordered.LVEF 60%.  Normal function.  Mild left ventricular hypertrophy RV function mildly reduced 8/24: Still confused.  Still on Precedex drip.  NG tube removed per surgery.  Modified barium swallow ordered. 8/25 Precedex stopped.  No longer confused.  Slowly advancing diet.  Barium swallow did demonstrate mild dysphagia.  Recommendations made by SLP.  Fairly hypertensive, Norvasc increased, as needed hydralazine. .  No longer needs critical care we will ask medicine to continue to provide support.  Internal medicine contacted and asked to assume primary consultant duties for medical needs, pulmonary following for specific pulmonary problems.  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  -Now s/p Open Colectomy with Ileostomy - present on Admisison -Neurosurgery is primary and he received 5 days of Zosyn and this ended 06/27/2021 -Currently on a dysphagia 3 diet per SLP recommendation -WOC Nurse involved [place] changed today -General surgery recommending SCDs and PPI starting subcu heparin on 821 -Pathology results were discussed by the general surgery team to the patient and the patient's family and showed a T4N1 obstructing rectosigmoid tumor -Findings will be present at the GI tumor board and outpatient oncology follow-up will be needed -PT OT recommending SNF and per surgery they will need to work on continue mobilizing and his work on his nutrition -Surgery using mulimodal Pain control   Chronic hypoxic and hypercarbic respiratory failure in the context of advanced chronic obstructive pulmonary disease and ongoing tobacco abuse Current every day smoker - 60ppd hx - present on admit -Pulmonary managing -Per Pulmonary: -Limited pulmonary reserve, actually on nocturnal CPAP at baseline due to advanced lung disease and pulmonary deconditioning Is actually doing quite well from a pulmonary standpoint.  His primary issues in the intensive care have been delirium, which is improved, and postoperative hypertension.  Overall however his postoperative course has gone as well as could be expected given his comorbidities.  I have asked the internal medicine service to assume his care in regards to hypertension, arrhythmia, electrolyte management.  We are still going to follow intermittently for his underlying lung disease -Continue supplemental oxygen, with nocturnal  while here in the intensive care Continue scheduled bronchodilators, and inhaled corticosteroids (he is on Stiolto respimat at home.  Adding flutter valve Maximize mobility Dysphagia precautions given risk for aspiration Be careful with narcotics  Acute Metabolic Encephalopathy  -New onset 06/26/2021 -His delirium confusion is improved and he is off of Precedex drip now as it was stopped 06/28/2021 -Continues to Have some Confusion and was more somnolent -Delirium Precautions   A. fib RVR 06/25/2021.  Prolonged QTC 511 ms-new 06/26/2021 HTN -C/w Telemetery Monitoring  -Currently on amlodipine 10 mg p.o. daily and Hydralazine 20 mg IV q4hprn SBP >170  -Continue with Metoprolol Tartrate 12.5 g p.o. twice daily  Anemia of blood loss and critical illness  -Patient's Hgb/Hct went from 8.9/28.9 -> 9.8/32.4 -> 9.0/29.3 -Check Anemia Panel in the AM  -Continue to Monitor for S/Sx of Bleeding; No overt bleeding noted -Repeat CBC in the AM   GERD/GI Prohylaxis -Continue with Pantoprazole 40 mg daily  Hx Prostate Cancer  stage 4 micrometastic - baseline prior to and on admit  Hx of Bladder Cancer in 2006 - early per patient on 06/28/2021 (MD add)  History of chronic HLD  Hx CVA October 2021- on DAPT prior to admit and this has been resumed  Severe protein calorie Malnutrition w/ cachexia (POA) Dysphagia, identified by modified barium swallow on 8/24 Failure to thrive  -Nutritionist consulted and recommending feeding supplements with boost breeze 1 container p.o. 3 times daily  DVT prophylaxis: Heparin 5,000 units sq q8h Code Status: FULL CODE Family Communication:  No family present at  bedside  Disposition Plan: Pending further clinical improvement and anticipate discharging to SNF next 48 hours  Status is: Inpatient  Remains inpatient appropriate because:Unsafe d/c plan, IV treatments appropriate due to intensity of illness or inability to take PO, and Inpatient level of care  appropriate due to severity of illness  Dispo: The patient is from: Home              Anticipated d/c is to: SNF              Patient currently is not medically stable to d/c.   Difficult to place patient No  Consultants:  General surgery PCCM  Procedures: EXPLORATORY LAPAROTOMY ABDOMINAL COLECTOMY WITH END ILEOSTOMY 06/21/2021  Antimicrobials:  Anti-infectives (From admission, onward)    Start     Dose/Rate Route Frequency Ordered Stop   06/24/2021 1400  piperacillin-tazobactam (ZOSYN) IVPB 3.375 g        3.375 g 12.5 mL/hr over 240 Minutes Intravenous Every 8 hours 06/23/2021 1357 06/27/21 1016   06/20/2021 0745  cefoTEtan (CEFOTAN) 2 g in sodium chloride 0.9 % 100 mL IVPB        2 g 200 mL/hr over 30 Minutes Intravenous On call to O.R. 06/19/2021 0740 06/24/2021 0831   06/21/2021 0745  sodium chloride 0.9 % with cefoTEtan (CEFOTAN) ADS Med       Note to Pharmacy: Charmayne Sheer   : cabinet override      07/02/2021 0745 06/23/2021 0854   06/20/2021 0700  piperacillin-tazobactam (ZOSYN) IVPB 3.375 g        3.375 g 100 mL/hr over 30 Minutes Intravenous  Once 06/10/2021 0653 06/21/2021 0733        Subjective: Seen and examined at bedside he was a little confused and little somnolent and wanting to sleep and take a nap.  No chest pain or shortness of breath.  Continues to have some abdominal discomfort.  No other concerns or complaints at this time  Objective: Vitals:   06/30/21 1221 06/30/21 1300 06/30/21 1400 06/30/21 1500  BP:  (!) 133/58 132/61 (!) 132/49  Pulse:  81 69 70  Resp:  (!) _0 Temp: 97.9 F (36.6 C)     TempSrc: Oral     SpO2:  (!) 89% 98% 94%  Weight:      Height:        Intake/Output Summary (Last 24 hours) at 06/30/2021 1536 Last data filed at 06/30/2021 1509 Gross per 24 hour  Intake 513.72 ml  Output 800 ml  Net -286.28 ml    Filed Weights   06/23/21 0456 06/24/21 0500 06/30/21 0423  Weight: 67.9 kg 66.4 kg 77.1 kg   Examination: Physical  Exam:  Constitutional: Chronically ill-appearing Caucasian male currently no acute distress appears calm Eyes: Lids and conjunctivae normal, sclerae anicteric  ENMT: External Ears, Nose appear normal. Grossly normal hearing.  Neck: Appears normal, supple, no cervical masses, normal ROM, no appreciable thyromegaly Respiratory: Diminished to auscultation bilaterally with coarse breath sounds, no wheezing, rales, rhonchi or crackles. Normal respiratory effort and patient is not tachypenic. No accessory muscle use.  Unlabored breathing Cardiovascular: RRR, no murmurs / rubs / gallops. S1 and S2 auscultated. No extremity edema.  Abdomen: Soft, non-tender, distended secondary body habitus and has a midline wound VAC in place with a colostomy bag. Bowel sounds positive.  GU: Deferred. Musculoskeletal: No clubbing / cyanosis of digits/nails. No joint deformity upper and lower extremities.  Skin: No rashes, lesions, ulcers on limited skin  evaluation. No induration; Warm and dry.  Neurologic: CN 2-12 grossly intact with no focal deficits. Romberg sign and cerebellar reflexes not assessed.  Psychiatric: Normal judgment and insight.  A little somnolent and drowsy. Normal mood and appropriate affect.   Data Reviewed: I have personally reviewed following labs and imaging studies  CBC: Recent Labs  Lab 06/26/21 0240 06/27/21 0610 06/28/21 0244 06/29/21 0253 06/30/21 0303  WBC 11.5* 9.3 10.2 11.5* 11.8*  NEUTROABS  --   --   --   --  9.0*  HGB 7.9* 8.2* 8.9* 9.8* 9.0*  HCT 25.0* 25.7* 28.9* 32.4* 29.3*  MCV 88.7 88.9 88.7 90.0 89.3  PLT 263 291 338 406* 761    Basic Metabolic Panel: Recent Labs  Lab 06/26/21 0240 06/27/21 0610 06/28/21 0244 06/29/21 0253 06/30/21 0303  NA 139 139 137 135 136  K 3.8 3.1* 3.2* 4.0 4.1  CL 99 97* 95* 96* 97*  CO2 31 29 33* 31 33*  GLUCOSE 101* 87 102* 118* 86  BUN 16 11 7* 9 11  CREATININE 0.76 0.67 0.55* 0.60* 0.61  CALCIUM 7.5* 7.5* 7.7* 8.1* 8.4*  MG  1.9 1.9 2.2 2.0 1.9  PHOS 1.4* 2.1* 1.8* 2.4* 2.9    GFR: Estimated Creatinine Clearance: 87 mL/min (by C-G formula based on SCr of 0.61 mg/dL). Liver Function Tests: Recent Labs  Lab 06/26/21 0240 06/27/21 0610 06/30/21 0303  AST 81* 42* 13*  ALT 114* 84* 35  ALKPHOS 68 72 64  BILITOT 0.8 1.3* 0.9  PROT 5.0* 5.1* 5.2*  ALBUMIN 2.2* 2.2* 2.4*    No results for input(s): LIPASE, AMYLASE in the last 168 hours. No results for input(s): AMMONIA in the last 168 hours. Coagulation Profile: No results for input(s): INR, PROTIME in the last 168 hours. Cardiac Enzymes: No results for input(s): CKTOTAL, CKMB, CKMBINDEX, TROPONINI in the last 168 hours. BNP (last 3 results) No results for input(s): PROBNP in the last 8760 hours. HbA1C: No results for input(s): HGBA1C in the last 72 hours. CBG: Recent Labs  Lab 06/29/21 1924 06/29/21 2334 06/30/21 0334 06/30/21 0737 06/30/21 1141  GLUCAP 98 94 94 85 80    Lipid Profile: No results for input(s): CHOL, HDL, LDLCALC, TRIG, CHOLHDL, LDLDIRECT in the last 72 hours. Thyroid Function Tests: No results for input(s): TSH, T4TOTAL, FREET4, T3FREE, THYROIDAB in the last 72 hours. Anemia Panel: No results for input(s): VITAMINB12, FOLATE, FERRITIN, TIBC, IRON, RETICCTPCT in the last 72 hours. Sepsis Labs: No results for input(s): PROCALCITON, LATICACIDVEN in the last 168 hours.  Recent Results (from the past 240 hour(s))  Resp Panel by RT-PCR (Flu A&B, Covid) Nasopharyngeal Swab     Status: None   Collection Time: 06/29/2021  6:37 AM   Specimen: Nasopharyngeal Swab; Nasopharyngeal(NP) swabs in vial transport medium  Result Value Ref Range Status   SARS Coronavirus 2 by RT PCR NEGATIVE NEGATIVE Final    Comment: (NOTE) SARS-CoV-2 target nucleic acids are NOT DETECTED.  The SARS-CoV-2 RNA is generally detectable in upper respiratory specimens during the acute phase of infection. The lowest concentration of SARS-CoV-2 viral copies  this assay can detect is 138 copies/mL. A negative result does not preclude SARS-Cov-2 infection and should not be used as the sole basis for treatment or other patient management decisions. A negative result may occur with  improper specimen collection/handling, submission of specimen other than nasopharyngeal swab, presence of viral mutation(s) within the areas targeted by this assay, and inadequate number of viral copies(<138 copies/mL).  A negative result must be combined with clinical observations, patient history, and epidemiological information. The expected result is Negative.  Fact Sheet for Patients:  EntrepreneurPulse.com.au  Fact Sheet for Healthcare Providers:  IncredibleEmployment.be  This test is no t yet approved or cleared by the [place] FDA and  has been authorized for detection and/or diagnosis of SARS-CoV-2 by FDA under an Emergency Use Authorization (EUA). This EUA will remain  in effect (meaning this test can be used) for the duration of the COVID-19 declaration under Section 564(b)(1) of the Act, 21 U.S.C.section 360bbb-3(b)(1), unless the authorization is terminated  or revoked sooner.       Influenza A by PCR NEGATIVE NEGATIVE Final   Influenza B by PCR NEGATIVE NEGATIVE Final    Comment: (NOTE) The Xpert Xpress SARS-CoV-2/FLU/RSV plus assay is intended as an aid in the diagnosis of influenza from Nasopharyngeal swab specimens and should not be used as a sole basis for treatment. Nasal washings and aspirates are unacceptable for Xpert Xpress SARS-CoV-2/FLU/RSV testing.  Fact Sheet for Patients: EntrepreneurPulse.com.au  Fact Sheet for Healthcare Providers: IncredibleEmployment.be  This test is not yet approved or cleared by the [place] FDA and has been authorized for detection and/or diagnosis of SARS-CoV-2 by FDA under an Emergency Use Authorization (EUA). This EUA  will remain in effect (meaning this test can be used) for the duration of the COVID-19 declaration under Section 564(b)(1) of the Act, 21 U.S.C. section 360bbb-3(b)(1), unless the authorization is terminated or revoked.  Performed at [facility], [place] [address] [place], [place] [address]   MRSA Next Gen by PCR, Nasal     Status: Abnormal   Collection Time: 06/21/2021 10:52 AM   Specimen: Nasal Mucosa; Nasal Swab  Result Value Ref Range Status   MRSA by PCR Next Gen DETECTED (A) NOT DETECTED Final    Comment: RESULT CALLED TO, READ BACK BY AND VERIFIED WITH: HEAVNER,A. RN _0  ON 08.19.2022 BY COHEN,K (NOTE) The GeneXpert MRSA Assay (FDA approved for NASAL specimens only), is one component of a comprehensive MRSA colonization surveillance program. It is not intended to diagnose MRSA infection nor to guide or monitor treatment for MRSA infections. Test performance is not FDA approved in patients less than 30 years old. Performed at [facility], [place] [address] [place], [place] [address]      RN Pressure Injury Documentation: Pressure Injury 06/25/21 Coccyx Medial;Right;Left Deep Tissue Pressure Injury - Purple or maroon localized area of discolored intact skin or blood-filled blister due to damage of underlying soft tissue from pressure and/or shear. (Active)  06/25/21 1300  Location: Coccyx  Location Orientation: Medial;Right;Left  Staging: Deep Tissue Pressure Injury - Purple or maroon localized area of discolored intact skin or blood-filled blister due to damage of underlying soft tissue from pressure and/or shear.  Wound Description (Comments):   Present on Admission:     Estimated body mass index is 23.05 kg/m as calculated from the following:   Height as of this encounter: 6' (1.829 m).   Weight as of this encounter: 77.1 kg.  Malnutrition Type:  Nutrition Problem: Moderate Malnutrition Etiology: chronic illness  (COPD)  Malnutrition Characteristics:  Signs/Symptoms: mild fat depletion, mild muscle depletion, moderate muscle depletion  Nutrition Interventions:  Interventions: Refer to RD note for recommendations  Radiology Studies: No results found.  Scheduled Meds:  amLODipine  10 mg Oral Daily   aspirin EC  81 mg Oral Daily   budesonide (PULMICORT) nebulizer solution  0.5 mg  Nebulization BID   chlorhexidine gluconate (MEDLINE KIT)  15 mL Mouth Rinse BID   Chlorhexidine Gluconate Cloth  6 each Topical Daily   clopidogrel  75 mg Oral Daily   feeding supplement  1 Container Oral TID BM   folic acid  1 mg Oral Daily   heparin injection (subcutaneous)  5,000 Units Subcutaneous Q8H   insulin aspart  0-9 Units Subcutaneous Q4H   ipratropium-albuterol  3 mL Nebulization Q4H WA   lip balm  1 application Topical BID   metoprolol tartrate  12.5 mg Oral BID   pantoprazole  40 mg Oral Daily   sodium chloride flush  10-40 mL Intracatheter Q12H   thiamine  100 mg Oral Daily   Continuous Infusions:  sodium chloride 10 mL/hr at 06/30/21 1509   methocarbamol (ROBAXIN) IV      LOS: 8 days   Kerney Elbe, DO [facility] PAGER is on AMION  If 7PM-7AM, please contact night-coverage [email or web address]

## 2021-07-01 LAB — CBC WITH DIFFERENTIAL/PLATELET
Abs Immature Granulocytes: 0.44 10*3/uL — ABNORMAL HIGH (ref 0.00–0.07)
Basophils Absolute: 0.1 10*3/uL (ref 0.0–0.1)
Basophils Relative: 1 %
Eosinophils Absolute: 0.4 10*3/uL (ref 0.0–0.5)
Eosinophils Relative: 4 %
HCT: 28.3 % — ABNORMAL LOW (ref 39.0–52.0)
Hemoglobin: 8.5 g/dL — ABNORMAL LOW (ref 13.0–17.0)
Immature Granulocytes: 4 %
Lymphocytes Relative: 6 %
Lymphs Abs: 0.6 10*3/uL — ABNORMAL LOW (ref 0.7–4.0)
MCH: 27.4 pg (ref 26.0–34.0)
MCHC: 30 g/dL (ref 30.0–36.0)
MCV: 91.3 fL (ref 80.0–100.0)
Monocytes Absolute: 1.2 10*3/uL — ABNORMAL HIGH (ref 0.1–1.0)
Monocytes Relative: 12 %
Neutro Abs: 7.7 10*3/uL (ref 1.7–7.7)
Neutrophils Relative %: 73 %
Platelets: 373 10*3/uL (ref 150–400)
RBC: 3.1 MIL/uL — ABNORMAL LOW (ref 4.22–5.81)
RDW: 18.1 % — ABNORMAL HIGH (ref 11.5–15.5)
WBC: 10.3 10*3/uL (ref 4.0–10.5)
nRBC: 0.2 % (ref 0.0–0.2)

## 2021-07-01 LAB — COMPREHENSIVE METABOLIC PANEL
ALT: 27 U/L (ref 0–44)
AST: 13 U/L — ABNORMAL LOW (ref 15–41)
Albumin: 2.4 g/dL — ABNORMAL LOW (ref 3.5–5.0)
Alkaline Phosphatase: 63 U/L (ref 38–126)
Anion gap: 6 (ref 5–15)
BUN: 10 mg/dL (ref 8–23)
CO2: 36 mmol/L — ABNORMAL HIGH (ref 22–32)
Calcium: 8.6 mg/dL — ABNORMAL LOW (ref 8.9–10.3)
Chloride: 98 mmol/L (ref 98–111)
Creatinine, Ser: 0.64 mg/dL (ref 0.61–1.24)
GFR, Estimated: >60 mL/min (ref >60)
Glucose, Bld: 98 mg/dL (ref 70–99)
Potassium: 4 mmol/L (ref 3.5–5.1)
Sodium: 140 mmol/L (ref 135–145)
Total Bilirubin: 0.8 mg/dL (ref 0.3–1.2)
Total Protein: 5.3 g/dL — ABNORMAL LOW (ref 6.5–8.1)

## 2021-07-01 LAB — GLUCOSE, CAPILLARY
Glucose-Capillary: 103 mg/dL — ABNORMAL HIGH (ref 70–99)
Glucose-Capillary: 108 mg/dL — ABNORMAL HIGH (ref 70–99)
Glucose-Capillary: 117 mg/dL — ABNORMAL HIGH (ref 70–99)
Glucose-Capillary: 118 mg/dL — ABNORMAL HIGH (ref 70–99)
Glucose-Capillary: 90 mg/dL (ref 70–99)
Glucose-Capillary: 95 mg/dL (ref 70–99)

## 2021-07-01 LAB — MAGNESIUM: Magnesium: 1.8 mg/dL (ref 1.7–2.4)

## 2021-07-01 LAB — PHOSPHORUS: Phosphorus: 3.2 mg/dL (ref 2.5–4.6)

## 2021-07-01 MED ORDER — SIMETHICONE 80 MG PO CHEW: 80.0000 mg | CHEWABLE_TABLET | Freq: Four times a day (QID) | ORAL | Status: DC | PRN

## 2021-07-01 MED ORDER — CHLORHEXIDINE GLUCONATE 0.12 % MT SOLN
OROMUCOSAL | Status: AC
  Filled 2021-07-01: qty 15

## 2021-07-01 NOTE — Progress Notes (Signed)
PROGRESS NOTE    Daniel King  OEU:[id number] DOB: [birth year] DOA: 06/26/2021 PCP: Henreitta Cea, MD   Brief Narrative:  Patient is a 75 year old Caucasian male who presented with 24 hours of worsening abdominal pain on 06/12/2021 with a last bowel movement on 818.  He was noted to have abdominal distention and tenderness.  CT of the abdomen was done and assessed with concern for primary colon cancer in the mid sigmoid colon complicated by colonic obstruction with pneumatosis involving the cecum and ascending colon, assistant with ischemic colitis.  He then had evidence of bowel perforation with small volume pneumoperitoneum.  He was evaluated by the general surgery team and recommended an open colectomy and ileostomy.  He was on Plavix prior to admission he was started on IV Zosyn.  Intraoperative findings were notable for massively dilated colon, cecum and ascending colon with ischemia and patches of phlegmon and gangrene of approximately 3 feet of colon was removed.  There was small volume pneumoperitoneum and rocky hard rectosigmoid mass worrisome for malignancy.  He had intraoperative hypotension requiring vasopressors return to ICU postsurgery on mechanical ventilation and postop hemoglobin was 7.6 was given 1 unit PRBCs.  PCCM was consulted for assistance with ICU care and then subsequently patient was extubated on 06/26/2021.  He has some postoperative confusion and delirium and was still confused but was placed on Precedex drip.  06/28/2021 Precedex drip was stopped and SLP evaluated and noted mild dysphagia.  He was switched over to [facility] hospital[facility] managing multiple medical issues on 06/29/2021.  Currently is clinical course is been the following: 8/19 admit with abd pain, CT with concern for perforation to OR for ex-lap with abdominal colectomy, colostomy. Returned to ICU on vent.  MRSA PCR positive COVID PCR negative Flu PCR negative Left IJ 8/19  Foley 8/19 > 8/22 ETT 8/19   >>8/22 06/24/21 - NAEON. UOP a bit better, still relatively oliguric. Cr stable. Alert, on SBT. Anticipate extubation. 06/25/21 : Failed SBT yesterday.  Lasix given.  Yesterday afebrile.  White count down to 11 point 9K.  Remains on ventilator 40% FiO2.  On fentanyl infusion, Precedex infusion, Levophed infusion off.  Only on vasopressin infusion.  Getting magnesium sulfate and potassium.  Remains on Zosyn.  Surgery noted that his oliguria is improving and creatinine normalized and to anticipate third space losses given colectomy -extubated 8/23 remains extubated. Hypercapnic. Did not do much BiPAP due to NG tube. On amio gtt and rate controlled . This AM - agitated and confused and hallucinating despite precedex gtt. Wheezing. Chart review shows severe Protein cal malnutn pre admit alb 2.,2. EKG now with NSR - QTc 511 mosec. On precedex, amio gtt, comparize prn.  Amiodarone discontinued due to prolonged QTC echocardiogram ordered.LVEF 60%.  Normal function.  Mild left ventricular hypertrophy RV function mildly reduced 8/24: Still confused.  Still on Precedex drip.  NG tube removed per surgery.  Modified barium swallow ordered. 8/25 Precedex stopped.  No longer confused.  Slowly advancing diet.  Barium swallow did demonstrate mild dysphagia.  Recommendations made by SLP.  Fairly hypertensive, Norvasc increased, as needed hydralazine. .  No longer needs critical care we will ask medicine to continue to provide support.  Internal medicine contacted and asked to assume primary consultant duties for medical needs, pulmonary following for specific pulmonary problems.  Given his advanced lung disease and malignancy we did ask palliative care to see the patient in consult to assist with goals of care  Assessment & Plan:  Principal Problem:   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 06/12/2021 Active Problems:   Perforated bowel ([place])   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HCC)   History of  stroke   Supplemental oxygen dependent   COPD (chronic obstructive pulmonary disease) (HCC)   Malnutrition of moderate degree   Acute respiratory failure with hypoxia (HCC)   Rectosigmoid cancer (HCC)  Acute Bowel Perforation w/ Pneumatosis; 2/2 sigmoid stricture/obstruction due to T4N1 obstructing rectosigmoid cancer .  -Now s/p Open Colectomy with Ileostomy - present on Admisison -Neurosurgery is primary and he received 5 days of Zosyn and this ended 06/27/2021 -Currently on a dysphagia 3 diet per SLP recommendation -WOC Nurse involved VAC changed today -General surgery recommending SCDs and PPI starting subcu heparin on 821 -Pathology results were discussed by the general surgery team to the patient and the patient's family and showed a T4N1 obstructing rectosigmoid tumor -Findings will be present at the GI tumor board and outpatient oncology follow-up will be needed -PT OT recommending SNF and per surgery they will need to work on continue mobilizing and his work on his nutrition but they don't want to advance his diet past D3 so will repeat a SLP evaluation -Surgery using mulimodal Pain control   Chronic hypoxic and hypercarbic respiratory failure in the context of advanced chronic obstructive pulmonary disease and ongoing tobacco abuse Current every day smoker - 60ppd hx - present on admit -Pulmonary managing -Per Pulmonary: -Limited pulmonary reserve, actually on nocturnal CPAP at baseline due to advanced lung disease and pulmonary deconditioning Is actually doing quite well from a pulmonary standpoint.  His primary issues in the intensive care have been delirium, which is improved, and postoperative hypertension.  Overall however his postoperative course has gone as well as could be expected given his comorbidities.  I have asked the internal medicine service to assume his care in regards to hypertension, arrhythmia, electrolyte management.  We are still going to follow intermittently  for his underlying lung disease -Continue supplemental oxygen, with nocturnal while here in the intensive care Continue scheduled bronchodilators, and inhaled corticosteroids (he is on Stiolto respimat at home.  Adding flutter valve Maximize mobility Dysphagia precautions given risk for aspiration Be careful with narcotics  Acute Metabolic Encephalopathy  -New onset 06/26/2021 -His delirium confusion is improved and he is off of Precedex drip now as it was stopped 06/28/2021 -Continues to Have some Confusion and was more somnolent -Delirium Precautions; much more alert today after using BiPAP   A. fib RVR 06/25/2021.  Prolonged QTC 511 ms-new 06/26/2021 HTN -C/w Telemetery Monitoring  -Currently on amlodipine 10 mg p.o. daily and Hydralazine 20 mg IV q4hprn SBP >170  -Continue with Metoprolol Tartrate 12.5 g p.o. twice daily  Anemia of blood loss and critical illness  -Patient's Hgb/Hct went from 8.9/28.9 -> 9.8/32.4 -> 9.0/29.3 -> 8.5/28.3 -Check Anemia Panel in the AM  -Continue to Monitor for S/Sx of Bleeding; No overt bleeding noted -Repeat CBC in the AM   GERD/GI Prohylaxis -Continue with Pantoprazole 40 mg daily  Hx Prostate Cancer  stage 4 micrometastic - baseline prior to and on admit  Hx of Bladder Cancer in 2006 - early per patient on 06/28/2021 (MD add)  History of chronic HLD  Hx CVA October 2021- on DAPT prior to admit and this has been resumed  Severe protein calorie Malnutrition w/ cachexia (POA) Dysphagia, identified by modified barium swallow on 8/24 Failure to thrive  -Nutritionist consulted and recommending feeding supplements with boost breeze  1 container p.o. 3 times daily -C/w D3 Diet and repeat SLP evaluation   DVT prophylaxis: Heparin 5,000 units sq q8h Code Status: FULL CODE Family Communication:  No family present at bedside  Disposition Plan: Pending further clinical improvement and anticipate discharging to SNF next 48 hours  Status is:  Inpatient  Remains inpatient appropriate because:Unsafe d/c plan, IV treatments appropriate due to intensity of illness or inability to take PO, and Inpatient level of care appropriate due to severity of illness  Dispo: The patient is from: Home              Anticipated d/c is to: SNF              Patient currently is not medically stable to d/c.   Difficult to place patient No  Consultants:  General surgery PCCM  Procedures: EXPLORATORY LAPAROTOMY ABDOMINAL COLECTOMY WITH END ILEOSTOMY 06/14/2021  Antimicrobials:  Anti-infectives (From admission, onward)    Start     Dose/Rate Route Frequency Ordered Stop   06/07/2021 1400  piperacillin-tazobactam (ZOSYN) IVPB 3.375 g        3.375 g 12.5 mL/hr over 240 Minutes Intravenous Every 8 hours 06/24/2021 1357 06/27/21 1016   07/04/2021 0745  cefoTEtan (CEFOTAN) 2 g in sodium chloride 0.9 % 100 mL IVPB        2 g 200 mL/hr over 30 Minutes Intravenous On call to O.R. 06/05/2021 0740 06/10/2021 0831   06/26/2021 0745  sodium chloride 0.9 % with cefoTEtan (CEFOTAN) ADS Med       Note to Pharmacy: Charmayne Sheer   : cabinet override      06/07/2021 0745 07/02/2021 0854   06/24/2021 0700  piperacillin-tazobactam (ZOSYN) IVPB 3.375 g        3.375 g 100 mL/hr over 30 Minutes Intravenous  Once 06/06/2021 0653 06/09/2021 0733        Subjective: Seen and examined at bedside and he was not as confused today.  Tolerated BiPAP for about an hour last night.  No chest pain or shortness of breath.  Having some mild abdominal discomfort.  No other concerns or complaints at this time and per nursing he is eating his diet.  He will.  We will obtain SLP to reevaluate if his diet can be advanced further.  Objective: Vitals:   07/01/21 1500 07/01/21 1538 07/01/21 1555 07/01/21 1600  BP:    (!) 149/63  Pulse: 75   73  Resp: 17   18  Temp:   97.7 F (36.5 C)   TempSrc:   Oral   SpO2: 92% 96%  97%  Weight:      Height:        Intake/Output Summary (Last 24 hours) at  07/01/2021 1618 Last data filed at 07/01/2021 1200 Gross per 24 hour  Intake 148.52 ml  Output 835 ml  Net -686.48 ml    Filed Weights   06/24/21 0500 06/30/21 0423 07/01/21 0500  Weight: 66.4 kg 77.1 kg 77.2 kg   Examination: Physical Exam:  Constitutional: Chronically ill-appearing elderly Caucasian male currently in no acute distress appears calm Eyes: Lids and conjunctivae normal, sclerae anicteric  ENMT: External Ears, Nose appear normal. Grossly normal hearing Neck: Appears normal, supple, no cervical masses, normal ROM, no appreciable thyromegaly; no JVD Respiratory: Diminished to auscultation bilaterally with coarse breath sounds, no wheezing, rales, rhonchi or crackles. Normal respiratory effort and patient is not tachypenic. No accessory muscle use.  Unlabored breathing Cardiovascular: RRR, no murmurs / rubs /  gallops. S1 and S2 auscultated.  No appreciable extremity edema Abdomen: Soft, tender to palpate.  Has distended secondary to body habitus and he has a midline wound with a wound VAC in place and a colostomy bag. Bowel sounds positive.  GU: Deferred. Musculoskeletal: No clubbing / cyanosis of digits/nails. No joint deformity upper and lower extremities.  Skin: No rashes, lesions, ulcers. No induration; Warm and dry.  Neurologic: CN 2-12 grossly intact with no focal deficits. Romberg sign cerebellar reflexes not assessed.  Psychiatric: Normal judgment and insight. Awake and Alert and oriented x 3. Normal mood and appropriate affect.   Data Reviewed: I have personally reviewed following labs and imaging studies  CBC: Recent Labs  Lab 06/27/21 0610 06/28/21 0244 06/29/21 0253 06/30/21 0303 07/01/21 0257  WBC 9.3 10.2 11.5* 11.8* 10.3  NEUTROABS  --   --   --  9.0* 7.7  HGB 8.2* 8.9* 9.8* 9.0* 8.5*  HCT 25.7* 28.9* 32.4* 29.3* 28.3*  MCV 88.9 88.7 90.0 89.3 91.3  PLT 291 338 406* 389 122    Basic Metabolic Panel: Recent Labs  Lab 06/27/21 0610 06/28/21 0244  06/29/21 0253 06/30/21 0303 07/01/21 0257  NA 139 137 135 136 140  K 3.1* 3.2* 4.0 4.1 4.0  CL 97* 95* 96* 97* 98  CO2 29 33* 31 33* 36*  GLUCOSE 87 102* 118* 86 98  BUN 11 7* _0 CREATININE 0.67 0.55* 0.60* 0.61 0.64  CALCIUM 7.5* 7.7* 8.1* 8.4* 8.6*  MG 1.9 2.2 2.0 1.9 1.8  PHOS 2.1* 1.8* 2.4* 2.9 3.2    GFR: Estimated Creatinine Clearance: 87.1 mL/min (by C-G formula based on SCr of 0.64 mg/dL). Liver Function Tests: Recent Labs  Lab 06/26/21 0240 06/27/21 0610 06/30/21 0303 07/01/21 0257  AST 81* 42* 13* 13*  ALT 114* 84* 35 27  ALKPHOS 68 72 64 63  BILITOT 0.8 1.3* 0.9 0.8  PROT 5.0* 5.1* 5.2* 5.3*  ALBUMIN 2.2* 2.2* 2.4* 2.4*    No results for input(s): LIPASE, AMYLASE in the last 168 hours. No results for input(s): AMMONIA in the last 168 hours. Coagulation Profile: No results for input(s): INR, PROTIME in the last 168 hours. Cardiac Enzymes: No results for input(s): CKTOTAL, CKMB, CKMBINDEX, TROPONINI in the last 168 hours. BNP (last 3 results) No results for input(s): PROBNP in the last 8760 hours. HbA1C: No results for input(s): HGBA1C in the last 72 hours. CBG: Recent Labs  Lab 06/30/21 2345 07/01/21 0343 07/01/21 0740 07/01/21 1208 07/01/21 1554  GLUCAP 87 95 90 108* 117*    Lipid Profile: No results for input(s): CHOL, HDL, LDLCALC, TRIG, CHOLHDL, LDLDIRECT in the last 72 hours. Thyroid Function Tests: No results for input(s): TSH, T4TOTAL, FREET4, T3FREE, THYROIDAB in the last 72 hours. Anemia Panel: No results for input(s): VITAMINB12, FOLATE, FERRITIN, TIBC, IRON, RETICCTPCT in the last 72 hours. Sepsis Labs: No results for input(s): PROCALCITON, LATICACIDVEN in the last 168 hours.  Recent Results (from the past 240 hour(s))  Resp Panel by RT-PCR (Flu A&B, Covid) Nasopharyngeal Swab     Status: None   Collection Time: 07/04/2021  6:37 AM   Specimen: Nasopharyngeal Swab; Nasopharyngeal(NP) swabs in vial transport medium  Result  Value Ref Range Status   SARS Coronavirus 2 by RT PCR NEGATIVE NEGATIVE Final    Comment: (NOTE) SARS-CoV-2 target nucleic acids are NOT DETECTED.  The SARS-CoV-2 RNA is generally detectable in upper respiratory specimens during the acute phase of infection. The lowest concentration of  SARS-CoV-2 viral copies this assay can detect is 138 copies/mL. A negative result does not preclude SARS-Cov-2 infection and should not be used as the sole basis for treatment or other patient management decisions. A negative result may occur with  improper specimen collection/handling, submission of specimen other than nasopharyngeal swab, presence of viral mutation(s) within the areas targeted by this assay, and inadequate number of viral copies(<138 copies/mL). A negative result must be combined with clinical observations, patient history, and epidemiological information. The expected result is Negative.  Fact Sheet for Patients:  EntrepreneurPulse.com.au  Fact Sheet for Healthcare Providers:  IncredibleEmployment.be  This test is no t yet approved or cleared by the [place] FDA and  has been authorized for detection and/or diagnosis of SARS-CoV-2 by FDA under an Emergency Use Authorization (EUA). This EUA will remain  in effect (meaning this test can be used) for the duration of the COVID-19 declaration under Section 564(b)(1) of the Act, 21 U.S.C.section 360bbb-3(b)(1), unless the authorization is terminated  or revoked sooner.       Influenza A by PCR NEGATIVE NEGATIVE Final   Influenza B by PCR NEGATIVE NEGATIVE Final    Comment: (NOTE) The Xpert Xpress SARS-CoV-2/FLU/RSV plus assay is intended as an aid in the diagnosis of influenza from Nasopharyngeal swab specimens and should not be used as a sole basis for treatment. Nasal washings and aspirates are unacceptable for Xpert Xpress SARS-CoV-2/FLU/RSV testing.  Fact Sheet for  Patients: EntrepreneurPulse.com.au  Fact Sheet for Healthcare Providers: IncredibleEmployment.be  This test is not yet approved or cleared by the [place] FDA and has been authorized for detection and/or diagnosis of SARS-CoV-2 by FDA under an Emergency Use Authorization (EUA). This EUA will remain in effect (meaning this test can be used) for the duration of the COVID-19 declaration under Section 564(b)(1) of the Act, 21 U.S.C. section 360bbb-3(b)(1), unless the authorization is terminated or revoked.  Performed at [facility], [place] [address], [place], [place] [address]   MRSA Next Gen by PCR, Nasal     Status: Abnormal   Collection Time: 07/04/2021 10:52 AM   Specimen: Nasal Mucosa; Nasal Swab  Result Value Ref Range Status   MRSA by PCR Next Gen DETECTED (A) NOT DETECTED Final    Comment: RESULT CALLED TO, READ BACK BY AND VERIFIED WITH: HEAVNER,A. RN _0  ON 08.19.2022 BY COHEN,K (NOTE) The GeneXpert MRSA Assay (FDA approved for NASAL specimens only), is one component of a comprehensive MRSA colonization surveillance program. It is not intended to diagnose MRSA infection nor to guide or monitor treatment for MRSA infections. Test performance is not FDA approved in patients less than 57 years old. Performed at [facility], [place] [address], [place], [place] [address]      RN Pressure Injury Documentation: Pressure Injury 06/25/21 Coccyx Medial;Right;Left Deep Tissue Pressure Injury - Purple or maroon localized area of discolored intact skin or blood-filled blister due to damage of underlying soft tissue from pressure and/or shear. (Active)  06/25/21 1300  Location: Coccyx  Location Orientation: Medial;Right;Left  Staging: Deep Tissue Pressure Injury - Purple or maroon localized area of discolored intact skin or blood-filled blister due to damage of underlying soft tissue from pressure and/or shear.   Wound Description (Comments):   Present on Admission:     Estimated body mass index is 23.08 kg/m as calculated from the following:   Height as of this encounter: 6' (1.829 m).   Weight as of this encounter: 77.2 kg.  Malnutrition  Type:  Nutrition Problem: Moderate Malnutrition Etiology: chronic illness (COPD)  Malnutrition Characteristics:  Signs/Symptoms: mild fat depletion, mild muscle depletion, moderate muscle depletion  Nutrition Interventions:  Interventions: Refer to RD note for recommendations  Radiology Studies: No results found.  Scheduled Meds:  amLODipine  10 mg Oral Daily   aspirin EC  81 mg Oral Daily   budesonide (PULMICORT) nebulizer solution  0.5 mg Nebulization BID   chlorhexidine gluconate (MEDLINE KIT)  15 mL Mouth Rinse BID   Chlorhexidine Gluconate Cloth  6 each Topical Daily   clopidogrel  75 mg Oral Daily   feeding supplement  1 Container Oral TID BM   folic acid  1 mg Oral Daily   heparin injection (subcutaneous)  5,000 Units Subcutaneous Q8H   insulin aspart  0-9 Units Subcutaneous Q4H   ipratropium-albuterol  3 mL Nebulization Q4H WA   lip balm  1 application Topical BID   metoprolol tartrate  12.5 mg Oral BID   pantoprazole  40 mg Oral Daily   sodium chloride flush  10-40 mL Intracatheter Q12H   thiamine  100 mg Oral Daily   Continuous Infusions:  sodium chloride 10 mL/hr at 06/30/21 2200   methocarbamol (ROBAXIN) IV      LOS: 9 days   Kerney Elbe, DO [facility] PAGER is on AMION  If 7PM-7AM, please contact night-coverage [email or web address]

## 2021-07-01 NOTE — Progress Notes (Signed)
Subjective Intermittent bipap.  Gas pains.  Objective: Vital signs in last 24 hours: Temp:  [97.5 F (36.4 C)-98 F (36.7 C)] 98 F (36.7 C) (08/28 0400) Pulse Rate:  [69-87] 72 (08/28 0700) Resp:  [14-23] 14 (08/28 0700) BP: (94-174)/(43-121) 102/45 (08/28 0600) SpO2:  [78 %-100 %] 100 % (08/28 0700) Weight:  [77.2 kg] 77.2 kg (08/28 0500) Last BM Date: 06/30/21  Intake/Output from previous day: 08/27 0701 - 08/28 0700 In: 240 [P.O.:50; I.V.:190] Out: 825 [Urine:750; Stool:75] Intake/Output this shift: No intake/output data recorded.  Gen: Alert, cooperative CV: RRR Pulm: unlabored, sat 99% on [place] Abd: Soft, nondistended, minimally tender. Midline wound VAC in place with serosanguineous output stoma viable, edema much improved, patent, liquid stool in the bag as well as air Ext: SCDs in place  Lab Results: CBC  Recent Labs    06/30/21 0303 07/01/21 0257  WBC 11.8* 10.3  HGB 9.0* 8.5*  HCT 29.3* 28.3*  PLT 389 373    BMET Recent Labs    06/30/21 0303 07/01/21 0257  NA 136 140  K 4.1 4.0  CL 97* 98  CO2 33* 36*  GLUCOSE 86 98  BUN 11 10  CREATININE 0.61 0.64  CALCIUM 8.4* 8.6*    PT/INR No results for input(s): LABPROT, INR in the last 72 hours. ABG Recent Labs    06/29/21 0417  PHART 7.375  HCO3 34.3*     Studies/Results:  Anti-infectives: Anti-infectives (From admission, onward)    Start     Dose/Rate Route Frequency Ordered Stop   06/10/2021 1400  piperacillin-tazobactam (ZOSYN) IVPB 3.375 g        3.375 g 12.5 mL/hr over 240 Minutes Intravenous Every 8 hours 07/03/2021 1357 06/27/21 1016   06/21/2021 0745  cefoTEtan (CEFOTAN) 2 g in sodium chloride 0.9 % 100 mL IVPB        2 g 200 mL/hr over 30 Minutes Intravenous On call to O.R. 06/07/2021 0740 06/05/2021 0831   06/26/2021 0745  sodium chloride 0.9 % with cefoTEtan (CEFOTAN) ADS Med       Note to Pharmacy: Charmayne Sheer   : cabinet override      06/28/2021 0745 06/29/2021 0854   06/04/2021 0700   piperacillin-tazobactam (ZOSYN) IVPB 3.375 g        3.375 g 100 mL/hr over 30 Minutes Intravenous  Once 06/21/2021 0653 06/20/2021 0733        Assessment/Plan: Patient Active Problem List   Diagnosis Date Noted   Rectosigmoid cancer ([place]) 06/28/2021   Acute respiratory failure with hypoxia ([place])    Perforated bowel ([place]) 06/17/2021   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[place]) 06/26/2021   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 06/12/2021 06/30/2021   History of stroke 06/14/2021   Supplemental oxygen dependent 07/02/2021   COPD (chronic obstructive pulmonary disease) ([place]) 06/13/2021   Malnutrition of moderate degree 06/05/2021   s/p Procedure(s): EXPLORATORY LAPAROTOMY ABDOMINAL COLECTOMY WITH END ILEOSTOMY 06/04/2021  -Appreciate CCMs and medicine team for assistance in his care  Extubated 8/22.  Some sundowning, precedex stopped 8/25. -Multimodal pain control -zosyn 5d course ended 8/24 -on D3 diet - okay for diet as tolerated per SLP recommendations -Appreciate assistance from WOCN - Added simethicone -Ppx: SCDs; PPI; SQH started 8/21 - T4N1 obstructing rectosigmoid cancer.  Dr. Johney Maine has initiated arrangements for GI tumor board and outpatient oncology follow-up.  Needs to work on conditioning/mobilizing and nutrition.    LOS: 9 days   Felicie Morn MD [facility]  [place] Use [email or web address] to contact on call provider

## 2021-07-02 DIAGNOSIS — K55049 Acute infarction of large intestine, extent unspecified: Secondary | ICD-10-CM | POA: Diagnosis not present

## 2021-07-02 LAB — CBC WITH DIFFERENTIAL/PLATELET
Abs Immature Granulocytes: 0.33 10*3/uL — ABNORMAL HIGH (ref 0.00–0.07)
Basophils Absolute: 0 10*3/uL (ref 0.0–0.1)
Basophils Relative: 1 %
Eosinophils Absolute: 0.3 10*3/uL (ref 0.0–0.5)
Eosinophils Relative: 3 %
HCT: 29.5 % — ABNORMAL LOW (ref 39.0–52.0)
Hemoglobin: 8.9 g/dL — ABNORMAL LOW (ref 13.0–17.0)
Immature Granulocytes: 4 %
Lymphocytes Relative: 8 %
Lymphs Abs: 0.7 10*3/uL (ref 0.7–4.0)
MCH: 27.7 pg (ref 26.0–34.0)
MCHC: 30.2 g/dL (ref 30.0–36.0)
MCV: 91.9 fL (ref 80.0–100.0)
Monocytes Absolute: 1 10*3/uL (ref 0.1–1.0)
Monocytes Relative: 11 %
Neutro Abs: 6.4 10*3/uL (ref 1.7–7.7)
Neutrophils Relative %: 73 %
Platelets: 377 10*3/uL (ref 150–400)
RBC: 3.21 MIL/uL — ABNORMAL LOW (ref 4.22–5.81)
RDW: 18 % — ABNORMAL HIGH (ref 11.5–15.5)
WBC: 8.7 10*3/uL (ref 4.0–10.5)
nRBC: 0 % (ref 0.0–0.2)

## 2021-07-02 LAB — BASIC METABOLIC PANEL
Anion gap: 8 (ref 5–15)
BUN: 11 mg/dL (ref 8–23)
CO2: 33 mmol/L — ABNORMAL HIGH (ref 22–32)
Calcium: 9.1 mg/dL (ref 8.9–10.3)
Chloride: 99 mmol/L (ref 98–111)
Creatinine, Ser: 0.68 mg/dL (ref 0.61–1.24)
GFR, Estimated: >60 mL/min (ref >60)
Glucose, Bld: 122 mg/dL — ABNORMAL HIGH (ref 70–99)
Potassium: 4.4 mmol/L (ref 3.5–5.1)
Sodium: 140 mmol/L (ref 135–145)

## 2021-07-02 LAB — RETICULOCYTES
Immature Retic Fract: 27.4 % — ABNORMAL HIGH (ref 2.3–15.9)
RBC.: 3.19 MIL/uL — ABNORMAL LOW (ref 4.22–5.81)
Retic Count, Absolute: 107.8 10*3/uL (ref 19.0–186.0)
Retic Ct Pct: 3.4 % — ABNORMAL HIGH (ref 0.4–3.1)

## 2021-07-02 LAB — GLUCOSE, CAPILLARY
Glucose-Capillary: 85 mg/dL (ref 70–99)
Glucose-Capillary: 91 mg/dL (ref 70–99)
Glucose-Capillary: 117 mg/dL — ABNORMAL HIGH (ref 70–99)
Glucose-Capillary: 109 mg/dL — ABNORMAL HIGH (ref 70–99)
Glucose-Capillary: 135 mg/dL — ABNORMAL HIGH (ref 70–99)

## 2021-07-02 LAB — BLOOD GAS, ARTERIAL
Acid-Base Excess: 7.8 mmol/L — ABNORMAL HIGH (ref 0.0–2.0)
Bicarbonate: 33.7 mmol/L — ABNORMAL HIGH (ref 20.0–28.0)
Drawn by: 25770
O2 Saturation: 91 %
Patient temperature: 98.6
pCO2 arterial: 59 mmHg — ABNORMAL HIGH (ref 32.0–48.0)
pH, Arterial: 7.375 (ref 7.350–7.450)
pO2, Arterial: 63.9 mmHg — ABNORMAL LOW (ref 83.0–108.0)

## 2021-07-02 LAB — IRON AND TIBC
Iron: 40 ug/dL — ABNORMAL LOW (ref 45–182)
Saturation Ratios: 12 % — ABNORMAL LOW (ref 17.9–39.5)
TIBC: 335 ug/dL (ref 250–450)
UIBC: 295 ug/dL

## 2021-07-02 LAB — PHOSPHORUS: Phosphorus: 3.7 mg/dL (ref 2.5–4.6)

## 2021-07-02 LAB — COMPREHENSIVE METABOLIC PANEL
ALT: 21 U/L (ref 0–44)
AST: 11 U/L — ABNORMAL LOW (ref 15–41)
Albumin: 2.5 g/dL — ABNORMAL LOW (ref 3.5–5.0)
Alkaline Phosphatase: 62 U/L (ref 38–126)
Anion gap: 6 (ref 5–15)
BUN: 10 mg/dL (ref 8–23)
CO2: 35 mmol/L — ABNORMAL HIGH (ref 22–32)
Calcium: 8.9 mg/dL (ref 8.9–10.3)
Chloride: 97 mmol/L — ABNORMAL LOW (ref 98–111)
Creatinine, Ser: 0.65 mg/dL (ref 0.61–1.24)
GFR, Estimated: >60 mL/min (ref >60)
Glucose, Bld: 93 mg/dL (ref 70–99)
Potassium: 4.2 mmol/L (ref 3.5–5.1)
Sodium: 138 mmol/L (ref 135–145)
Total Bilirubin: 0.9 mg/dL (ref 0.3–1.2)
Total Protein: 5.3 g/dL — ABNORMAL LOW (ref 6.5–8.1)

## 2021-07-02 LAB — VITAMIN B12: Vitamin B-12: 767 pg/mL (ref 180–914)

## 2021-07-02 LAB — FOLATE: Folate: 8.5 ng/mL (ref >5.9)

## 2021-07-02 LAB — FERRITIN: Ferritin: 86 ng/mL (ref 24–336)

## 2021-07-02 LAB — MAGNESIUM: Magnesium: 1.6 mg/dL — ABNORMAL LOW (ref 1.7–2.4)

## 2021-07-02 MED ORDER — IBUPROFEN 200 MG PO TABS
400.0000 mg | ORAL_TABLET | Freq: Three times a day (TID) | ORAL | Status: DC
  Administered 2021-07-02 – 2021-07-07 (×18): 400 mg via ORAL
  Filled 2021-07-02 (×18): qty 2

## 2021-07-02 MED ORDER — SILVER NITRATE-POT NITRATE 75-25 % EX MISC
1.0000 "application " | Freq: Once | CUTANEOUS | Status: AC
  Administered 2021-07-02: 1 via TOPICAL
  Filled 2021-07-02: qty 1

## 2021-07-02 MED ORDER — MAGNESIUM SULFATE 2 GM/50ML IV SOLN
2.0000 g | Freq: Once | INTRAVENOUS | Status: AC
  Administered 2021-07-02: 2 g via INTRAVENOUS
  Filled 2021-07-02: qty 50

## 2021-07-02 MED ORDER — POLYSACCHARIDE IRON COMPLEX 150 MG PO CAPS
150.0000 mg | ORAL_CAPSULE | Freq: Every day | ORAL | Status: DC
  Administered 2021-07-02 – 2021-07-07 (×6): 150 mg via ORAL
  Filled 2021-07-02 (×9): qty 1

## 2021-07-02 MED ORDER — METHOCARBAMOL 500 MG PO TABS
500.0000 mg | ORAL_TABLET | Freq: Three times a day (TID) | ORAL | Status: DC
  Administered 2021-07-02 – 2021-07-07 (×18): 500 mg via ORAL
  Filled 2021-07-02 (×21): qty 1

## 2021-07-02 MED ORDER — CHLORHEXIDINE GLUCONATE 0.12 % MT SOLN
OROMUCOSAL | Status: AC
  Filled 2021-07-02: qty 15

## 2021-07-02 MED ORDER — OXYCODONE HCL 5 MG PO TABS
5.0000 mg | ORAL_TABLET | ORAL | Status: DC | PRN
  Administered 2021-07-02: 10 mg via ORAL
  Administered 2021-07-07: 5 mg via ORAL
  Filled 2021-07-02: qty 2
  Filled 2021-07-02: qty 1

## 2021-07-02 MED ORDER — ACETAMINOPHEN 325 MG PO TABS
650.0000 mg | ORAL_TABLET | Freq: Four times a day (QID) | ORAL | Status: DC
  Administered 2021-07-02 – 2021-07-08 (×24): 650 mg via ORAL
  Filled 2021-07-02 (×23): qty 2

## 2021-07-02 NOTE — Progress Notes (Signed)
  Speech Language Pathology Treatment: Dysphagia  Patient Details Name: Daniel King MRN: [id number] DOB: 05-07-1946 Today's Date: 07/02/2021 Time: [id number] SLP Time Calculation (min) (ACUTE ONLY): 20 min  Assessment / Plan / Recommendation Clinical Impression  Patient seen to address dysphagia goals with wife present in room. SLP received new BSE order and per RN this was because they wanted to have patient reassessed to make sure he would be safe with upgraded liquids and solids. As patient has been on ST caseload and no medical changes, no new BSE charge. RN also stated that patient has been tolerating his Dys 3, nectar thick liquids diet very well. When SLP entered room, patient awake, alert but per wife mildly confused. SLP reviewed recent MBS which showed trace deep penetration but no aspiration with thin liquids and no signifciant pharyngeal residuals post initial swallow. SLP observed patient with consecutive straw sips of thin liquids. No overt s/s aspiration or penetration, no change in vitals and no change in vocal function. As patient's aspiration risk was mild as per MBS without aspiration and patient desiring to have more options due to poor appetite, SLP is recommending to upgrade to regular solids textures, thin liquids. SLP educated wife regarding ordering foods that patient could tolerate without difficulty. SLP will briefly follow for diet toleration.   HPI HPI: 75 yo male adm to [facility] on perforated bowel, gangrene of colon from obstruction s/p abdominal colectomy/ileostomy 06/27/2021.  Pt required intubation and was extubated on 8/22.  CXR showed progresive opacities at lung bases on 8/23//22. 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  Pt with reverse lordosis of cervical spine.  Swallow eval ordered.  Pt has NG removed at this time.  Wife, Daniel King, present and pt and wife deny pt having dysphagia PTA but report he  did see an SLP at [facility] for swallowing following his cva.      SLP Plan  Continue with current plan of care       Recommendations  Diet recommendations: Regular;Thin liquid Liquids provided via: Cup;Straw Medication Administration: Crushed with puree Supervision: Staff to assist with self feeding Compensations: Minimize environmental distractions;Slow rate;Small sips/bites Postural Changes and/or Swallow Maneuvers: Seated upright 90 degrees;Upright 30-60 min after meal                Oral Care Recommendations: Oral care BID Follow up Recommendations: Other (comment) (TBD, likely none) SLP Visit Diagnosis: Dysphagia, unspecified (R13.10) Plan: Continue with current plan of care              Sonia Baller, MA, CCC-SLP Speech Therapy

## 2021-07-02 NOTE — Progress Notes (Signed)
Patient O2 sats reading 76-80% on O2 per monitor and poor waveform. Pulseox changed with good waveform. Patient was on 4 L [place] (ABG obtained on this) and O2 weaned to 2 L. O2 maintaining 94-97% on 2 L. RN made aware.

## 2021-07-02 NOTE — Consult Note (Signed)
[place] Nurse wound follow up Patient receiving care in [facility] ICU 1232. Spouse, [name], at bedside. Wound type:abdominal surgical. Patient was premedicated prior to initiating care. Measurement: 17 cm x 4 cm x 2.3 cm.  When the existing VAC dressing was removed there was a superficial, bleeding vessel along the upper, medial wound margin. Light pressure resolved the oozing from this area.  Once the NPWT was resumed, there was scant bright red bleeding in the tubing, likely from this area. I have made the primary RN, Liane Comber, aware of this. Wound bed: One grey tissue area in the lower 1/3 of the wound; one small yellow area along the upper 1/3 of the wound. Otherwise, red granulation tissue Drainage (amount, consistency, odor) dark red in existing cannister Periwound: intact Dressing procedure/placement/frequency: One piece of black foam removed, two pieces of black foam placed into the wound bed. Drape applied, immediate seal obtained.  [place] Nurse ostomy follow up Stoma type/location: RUQ ileostomy Stomal assessment/size: 1.5 inches, round, moist, protruding slightly, sutures intact Peristomal assessment: intact Treatment options for stomal/peristomal skin: barrier ring Output: approximately 40 ml liquid brown stool Ostomy pouching: 2pc. 2 /14 inch system Education provided: Wife participated in discussion of ostomy care, demonstrated opening/closing, verbalized emptying procedure; cut out the new skin barrier for placement; stretched the barrier ring for application.  Enrolled patient in [place] Start Discharge program: No   Val Riles, RN, MSN, [facility], CNS-BC, pager [phone]

## 2021-07-02 NOTE — Progress Notes (Signed)
PROGRESS NOTE    Daniel King  UXN:[id number] DOB: Nov 19, 1945 DOA: 06/08/2021 PCP: Henreitta Cea, MD   Brief Narrative:  Patient is a 75 year old Caucasian male who presented with 24 hours of worsening abdominal pain on 06/27/2021 with a last bowel movement on 818.  He was noted to have abdominal distention and tenderness.  CT of the abdomen was done and assessed with concern for primary colon cancer in the mid sigmoid colon complicated by colonic obstruction with pneumatosis involving the cecum and ascending colon, assistant with ischemic colitis.  He then had evidence of bowel perforation with small volume pneumoperitoneum.  He was evaluated by the general surgery team and recommended an open colectomy and ileostomy.  He was on Plavix prior to admission he was started on IV Zosyn.  Intraoperative findings were notable for massively dilated colon, cecum and ascending colon with ischemia and patches of phlegmon and gangrene of approximately 3 feet of colon was removed.  There was small volume pneumoperitoneum and rocky hard rectosigmoid mass worrisome for malignancy.  He had intraoperative hypotension requiring vasopressors return to ICU postsurgery on mechanical ventilation and postop hemoglobin was 7.6 was given 1 unit PRBCs.  PCCM was consulted for assistance with ICU care and then subsequently patient was extubated on 06/26/2021.  He has some postoperative confusion and delirium and was still confused but was placed on Precedex drip.  06/28/2021 Precedex drip was stopped and SLP evaluated and noted mild dysphagia.  He was switched over to [facility] managing multiple medical issues on 06/29/2021.  Currently is clinical course is been the following: 8/19 admit with abd pain, CT with concern for perforation to OR for ex-lap with abdominal colectomy, colostomy. Returned to ICU on vent.  MRSA PCR positive COVID PCR negative Flu PCR negative Left IJ 8/19  Foley 8/19 > 8/22 ETT 8/19   >>8/22 06/24/21 - NAEON. UOP a bit better, still relatively oliguric. Cr stable. Alert, on SBT. Anticipate extubation. 06/25/21 : Failed SBT yesterday.  Lasix given.  Yesterday afebrile.  White count down to 11 point 9K.  Remains on ventilator 40% FiO2.  On fentanyl infusion, Precedex infusion, Levophed infusion off.  Only on vasopressin infusion.  Getting magnesium sulfate and potassium.  Remains on Zosyn.  Surgery noted that his oliguria is improving and creatinine normalized and to anticipate third space losses given colectomy -extubated 8/23 remains extubated. Hypercapnic. Did not do much BiPAP due to NG tube. On amio gtt and rate controlled . This AM - agitated and confused and hallucinating despite precedex gtt. Wheezing. Chart review shows severe Protein cal malnutn pre admit alb 2.,2. EKG now with NSR - QTc 511 mosec. On precedex, amio gtt, comparize prn.  Amiodarone discontinued due to prolonged QTC echocardiogram ordered.LVEF 60%.  Normal function.  Mild left ventricular hypertrophy RV function mildly reduced 8/24: Still confused.  Still on Precedex drip.  NG tube removed per surgery.  Modified barium swallow ordered. 8/25 Precedex stopped.  No longer confused.  Slowly advancing diet.  Barium swallow did demonstrate mild dysphagia.  Recommendations made by SLP.  Fairly hypertensive, Norvasc increased, as needed hydralazine. .  No longer needs critical care we will ask medicine to continue to provide support.  Internal medicine contacted and asked to assume primary consultant duties for medical needs, pulmonary following for specific pulmonary problems.  Given his advanced lung disease and malignancy we did ask palliative care to see the patient in consult to assist with goals of care 7.  Respiratory status has been  has been improving however he is more somnolent on 07/02/2021 so we will obtain a ABG.  Patient was not compliant with his BiPAP last night.  Surgery rectum and continue dysphagia 3 diet and  they are considering exchanging or replacing his wound VAC if he continues to have issues.  Supplemental oxygen has been weaning.  Pulmonary has  signed off the case but he has been intermittently wearing BiPAP.  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  -Now s/p Open Colectomy with Ileostomy - present on Admisison -Neurosurgery is primary and he received 5 days of Zosyn and this ended 06/27/2021 -Currently on a dysphagia 3 diet per SLP recommendation -WOC Nurse involved [place] changed today -General surgery recommending SCDs and PPI starting subcu heparin on 821 -Pathology results were discussed by the general surgery team to the patient and the patient's family and showed a T4N1 obstructing rectosigmoid tumor -Findings will be present at the GI tumor board and outpatient oncology follow-up will be needed -PT OT recommending SNF and per surgery they will need to work on continue mobilizing and his work on his nutrition but they don't want to advance his diet past D3 so will repeat a SLP evaluation -Surgery using mulimodal Pain control but will now change his medications given his somnolence  Chronic hypoxic and hypercarbic respiratory failure in the context of advanced chronic obstructive pulmonary disease and ongoing tobacco abuse Current every day smoker -  60ppd hx - present on admit -Pulmonary managing -Per Pulmonary: -Limited pulmonary reserve, actually on nocturnal CPAP at baseline due to advanced lung disease and pulmonary deconditioning Is actually doing quite well from a pulmonary standpoint.  His primary issues in the intensive care have been delirium, which is improved, and postoperative hypertension.  Overall however his postoperative course has gone as well as could be expected given his comorbidities.  I have asked the internal medicine service to assume his care in regards to hypertension, arrhythmia, electrolyte management.  We are still going to follow intermittently for his underlying lung disease -Continue supplemental oxygen, with nocturnal while here in the intensive care Continue scheduled bronchodilators, and inhaled corticosteroids (he is on Stiolto respimat at home.  Adding flutter valve -ABG    Component Value Date/Time   PHART 7.375 07/02/2021 1035   PCO2ART 59.0 (H) 07/02/2021 1035   PO2ART 63.9 (L) 07/02/2021 1035   HCO3 33.7 (H) 07/02/2021 1035   ACIDBASEDEF 0.4 06/10/2021 1130   O2SAT 91.0 07/02/2021 1035    Maximize mobility Dysphagia precautions given risk for aspiration Be careful with narcotics -Pulmonary signing off the case now and he continues to be on 2 to 4 L during the day and intermittent BiPAP at night and nightly  Acute Metabolic Encephalopathy  -New onset 06/26/2021 -His delirium confusion is improved and he is off of Precedex drip now as it was stopped 06/28/2021 -Continues  to Have some Confusion and was more somnolent -Delirium Precautions; much more alert yesterday after using BiPAP however he was confused and somnolent this morning after he did not wear his BiPAP.  ABG done showing an elevated CO2  A. fib RVR 06/25/2021.  Prolonged QTC 511 ms-new 06/26/2021 HTN -C/w Telemetery Monitoring  -Currently on amlodipine 10 mg p.o. daily and Hydralazine 20 mg IV q4hprn SBP >170  -Continue with  Metoprolol Tartrate 12.5 g p.o. twice daily  Anemia of blood loss and critical illness  -Patient's Hgb/Hct went from 8.9/28.9 -> 9.8/32.4 -> 9.0/29.3 -> 8.5/28.3 and was 8.9/29.5 -Checked anemia panel and showed an iron level of 40, U IBC of 295, TIBC of 335, saturation ratios of 12%, ferritin level of 86, folate level 8.5, and vitamin B12 level 767 -Continue to Monitor for S/Sx of Bleeding; No overt bleeding noted -Repeat CBC in the AM   Hypomagnesemia -Mild with a magnesium level of 1.6 -Replete with IV mag sulfate 2 g -Continue to monitor and replete as necessary -Repeat magnesium level in a.m.  GERD/GI Prohylaxis -Continue with Pantoprazole 40 mg daily  Hx Prostate Cancer  stage 4 micrometastic - baseline prior to and on admit  Hx of Bladder Cancer in 2006 - early per patient on 06/28/2021 (MD add)  History of chronic HLD  Hx CVA October 2021- on DAPT prior to admit and this has been resumed  Severe protein calorie Malnutrition w/ cachexia (POA) Dysphagia, identified by modified barium swallow on 8/24 Failure to thrive  -Nutritionist consulted and recommending feeding supplements with boost breeze 1 container p.o. 3 times daily -C/w D3 Diet and repeat SLP evaluation   DVT prophylaxis: Heparin 5,000 units sq q8h Code Status: FULL CODE Family Communication: Discussed with wife at bedside Disposition Plan: Pending further clinical improvement and anticipate discharging to SNF next 48 hours  Status is: Inpatient  Remains inpatient appropriate because:Unsafe d/c plan, IV treatments appropriate due to intensity of illness or inability to take PO, and Inpatient level of care appropriate due to severity of illness  Dispo: The patient is from: Home              Anticipated d/c is to: SNF              Patient currently is not medically stable to d/c.   Difficult to place patient No  Consultants:  General surgery PCCM  Procedures: EXPLORATORY LAPAROTOMY ABDOMINAL COLECTOMY  WITH END ILEOSTOMY 06/29/2021  Antimicrobials:  Anti-infectives (From admission, onward)    Start     Dose/Rate Route Frequency Ordered Stop   06/20/2021 1400  piperacillin-tazobactam (ZOSYN) IVPB 3.375 g        3.375 g 12.5 mL/hr over 240 Minutes Intravenous Every 8 hours 06/23/2021 1357 06/27/21 1016   06/05/2021 0745  cefoTEtan (CEFOTAN) 2 g in sodium chloride 0.9 % 100 mL IVPB        2 g 200 mL/hr over 30 Minutes Intravenous On call to O.R. 07/04/2021 0740 06/12/2021 0831   06/21/2021 0745  sodium chloride 0.9 % with cefoTEtan (CEFOTAN) ADS Med       Note to Pharmacy: Charmayne Sheer   : cabinet override      06/21/2021 0745 07/04/2021 0854   06/17/2021 0700  piperacillin-tazobactam (ZOSYN) IVPB 3.375 g        3.375 g 100 mL/hr over 30 Minutes Intravenous  Once 06/14/2021 0653 07/01/2021 0733        Subjective: Seen and examined at bedside and he is  very sleepy and somnolent this morning.  Did not really tolerate his BiPAP last night and wife states that he only wore it about for an hour.  No chest pain or shortness of breath.  She states that he has been more confused.  No other concerns or plans at this time.  Objective: Vitals:   07/02/21 1600 07/02/21 1700 07/02/21 1800 07/02/21 1803  BP: (!) 172/68 (!) 125/53    Pulse: 80 76 80   Resp: _0 Temp:  97.8 F (36.6 C)  97.6 F (36.4 C)  TempSrc:  Axillary  Axillary  SpO2: 100% 98% 97%   Weight:      Height:        Intake/Output Summary (Last 24 hours) at 07/02/2021 1916 Last data filed at 07/02/2021 1112 Gross per 24 hour  Intake 421.99 ml  Output 440 ml  Net -18.01 ml    Filed Weights   06/30/21 0423 07/01/21 0500 07/02/21 0500  Weight: 77.1 kg 77.2 kg 77.5 kg   Examination: Physical Exam:  Constitutional: Chronically ill-appearing elderly Caucasian male who is somnolent and drowsy Eyes: Lids are closed ENMT: External Ears, Nose appear normal.  Neck: Appears normal, supple, no cervical masses, normal ROM, no appreciable  thyromegaly; no JVD Respiratory: Diminished to auscultation bilaterally with coarse breath sounds, no wheezing, rales, rhonchi or crackles. Normal respiratory effort and patient is not tachypenic. No accessory muscle use.  Wearing supplemental oxygen via nasal cannula Cardiovascular: RRR, no murmurs / rubs / gallops. S1 and S2 auscultated.  Minimal extremity edema Abdomen: Soft, non-tender, distended secondary body habitus and he has a midline wound VAC in place and a colostomy. Bowel sounds positive.  GU: Deferred. Musculoskeletal: No clubbing / cyanosis of digits/nails. No joint deformity upper and lower extremities.  Skin: No rashes, lesions, ulcers on limited skin evaluation. No induration; Warm and dry.  Neurologic: He is somnolent and drowsy does not follow commands Psychiatric: Impaired judgment and insight.  He is not awake and alert as he is somnolent and drowsy and sleeping  Data Reviewed: I have personally reviewed following labs and imaging studies  CBC: Recent Labs  Lab 06/28/21 0244 06/29/21 0253 06/30/21 0303 07/01/21 0257 07/02/21 0301  WBC 10.2 11.5* 11.8* 10.3 8.7  NEUTROABS  --   --  9.0* 7.7 6.4  HGB 8.9* 9.8* 9.0* 8.5* 8.9*  HCT 28.9* 32.4* 29.3* 28.3* 29.5*  MCV 88.7 90.0 89.3 91.3 91.9  PLT 338 406* 389 373 570    Basic Metabolic Panel: Recent Labs  Lab 06/28/21 0244 06/29/21 0253 06/30/21 0303 07/01/21 0257 07/02/21 0301  NA 137 135 136 140 138  K 3.2* 4.0 4.1 4.0 4.2  CL 95* 96* 97* 98 97*  CO2 33* 31 33* 36* 35*  GLUCOSE 102* 118* 86 98 93  BUN 7* _1 CREATININE 0.55* 0.60* 0.61 0.64 0.65  CALCIUM 7.7* 8.1* 8.4* 8.6* 8.9  MG 2.2 2.0 1.9 1.8 1.6*  PHOS 1.8* 2.4* 2.9 3.2 3.7    GFR: Estimated Creatinine Clearance: 87.5 mL/min (by C-G formula based on SCr of 0.65 mg/dL). Liver Function Tests: Recent Labs  Lab 06/26/21 0240 06/27/21 0610 06/30/21 0303 07/01/21 0257 07/02/21 0301  AST 81* 42* 13* 13* 11*  ALT 114* 84* 35 27 21   ALKPHOS 68 72 64 63 62  BILITOT 0.8 1.3* 0.9 0.8 0.9  PROT 5.0* 5.1* 5.2* 5.3* 5.3*  ALBUMIN 2.2* 2.2* 2.4* 2.4* 2.5*    No  results for input(s): LIPASE, AMYLASE in the last 168 hours. No results for input(s): AMMONIA in the last 168 hours. Coagulation Profile: No results for input(s): INR, PROTIME in the last 168 hours. Cardiac Enzymes: No results for input(s): CKTOTAL, CKMB, CKMBINDEX, TROPONINI in the last 168 hours. BNP (last 3 results) No results for input(s): PROBNP in the last 8760 hours. HbA1C: No results for input(s): HGBA1C in the last 72 hours. CBG: Recent Labs  Lab 07/01/21 2355 07/02/21 0345 07/02/21 0727 07/02/21 1140 07/02/21 1610  GLUCAP 103* 85 91 117* 109*    Lipid Profile: No results for input(s): CHOL, HDL, LDLCALC, TRIG, CHOLHDL, LDLDIRECT in the last 72 hours. Thyroid Function Tests: No results for input(s): TSH, T4TOTAL, FREET4, T3FREE, THYROIDAB in the last 72 hours. Anemia Panel: Recent Labs    07/02/21 0301  VITAMINB12 767  FOLATE 8.5  FERRITIN 86  TIBC 335  IRON 40*  RETICCTPCT 3.4*   Sepsis Labs: No results for input(s): PROCALCITON, LATICACIDVEN in the last 168 hours.  No results found for this or any previous visit (from the past 240 hour(s)).    RN Pressure Injury Documentation: Pressure Injury 06/25/21 Coccyx Medial;Right;Left Deep Tissue Pressure Injury - Purple or maroon localized area of discolored intact skin or blood-filled blister due to damage of underlying soft tissue from pressure and/or shear. (Active)  06/25/21 1300  Location: Coccyx  Location Orientation: Medial;Right;Left  Staging: Deep Tissue Pressure Injury - Purple or maroon localized area of discolored intact skin or blood-filled blister due to damage of underlying soft tissue from pressure and/or shear.  Wound Description (Comments):   Present on Admission:     Estimated body mass index is 23.17 kg/m as calculated from the following:   Height as of this  encounter: 6' (1.829 m).   Weight as of this encounter: 77.5 kg.  Malnutrition Type:  Nutrition Problem: Moderate Malnutrition Etiology: chronic illness (COPD)  Malnutrition Characteristics:  Signs/Symptoms: mild fat depletion, mild muscle depletion, moderate muscle depletion  Nutrition Interventions:  Interventions: Refer to RD note for recommendations  Radiology Studies: No results found.  Scheduled Meds:  acetaminophen  650 mg Oral Q6H   amLODipine  10 mg Oral Daily   aspirin EC  81 mg Oral Daily   budesonide (PULMICORT) nebulizer solution  0.5 mg Nebulization BID   chlorhexidine gluconate (MEDLINE KIT)  15 mL Mouth Rinse BID   Chlorhexidine Gluconate Cloth  6 each Topical Daily   clopidogrel  75 mg Oral Daily   feeding supplement  1 Container Oral TID BM   folic acid  1 mg Oral Daily   heparin injection (subcutaneous)  5,000 Units Subcutaneous Q8H   ibuprofen  400 mg Oral TID   ipratropium-albuterol  3 mL Nebulization Q4H WA   iron polysaccharides  150 mg Oral Daily   lip balm  1 application Topical BID   methocarbamol  500 mg Oral TID   metoprolol tartrate  12.5 mg Oral BID   pantoprazole  40 mg Oral Daily   sodium chloride flush  10-40 mL Intracatheter Q12H   thiamine  100 mg Oral Daily   Continuous Infusions:  sodium chloride 10 mL/hr at 07/02/21 1112    LOS: 10 days   Kerney Elbe, DO [facility] PAGER is on AMION  If 7PM-7AM, please contact night-coverage [email or web address]

## 2021-07-02 NOTE — Consult Note (Signed)
[place] Nurse wound follow up Three silver nitrate sticks used to stop the bleeding from the medial upper wound margin. Then a saline moistened gauze dressing was placed into the wound, and covered with a folded ABD pad. Spouse at the bedside and updated on the actions being taken and the reasons why.  Spouse, [name], shown how to burp the ostomy pouch to let the air out. Val Riles, RN, MSN, CWOCN, CNS-BC, pager [phone]

## 2021-07-02 NOTE — Progress Notes (Signed)
NAME:  Daniel King, MRN:  [id number], DOB:  09/07/1946, LOS: 10 ADMISSION DATE:  06/21/2021, CONSULTATION DATE:  8/19 REFERRING MD:  Dr. Johney Maine, CHIEF COMPLAINT:  Post-operative respiratory failure    bRIEF - also MRN duplicate is MRN:  [id number]  75 y/o M who presented to [facility] on 8/19 with reports of 24 hours of abdominal pain that worsened the am of presentation.   He reported his last BM on 8/18, prior to that was one week ago.  On presentation he was note to have abdominal distention, tenderness.  CT of the abdomen was assessed with concerns for primary colon cancer in the mid sigmoid colon complicated by colonic obstruction with pneumatosis involving the cecum and ascending colon, consistent with ischemic colitis. Evidence of bowel perforation with small volume pneumoperitoneum.  He was evaluated by CCS and recommended for open colectomy / ileostomy.  He was on plavix prior to admit.  He was started on IV zosyn.  Intraoperative findings notable for massively dilated colon, cecum and ascending colon with ischemia and patches of phlegmon & gangrene with approximately 3 feet of colon removed, small volume pneumoperitoneum, rocky hard rectosigmoid mass worrisome for malignancy.  EBL estimated at 135m.  He had intraoperative hypotension requiring vasopressors.  The patient was returned to the ICU post surgery on mechanical ventilation. Post op Hgb 7.6.  The patient was given 1 unit PRBC.    PCCM consulted for assistance with ICU care.    Pertinent  Medical History  COPD  -  Managed by [facility] Dr. CGwenette Greet.  Smoked 60 yr 1.551yr Recently 2 PPD , quit at discharge  Chronic Hypoxic and hypercapic Respiratory Failure - on home O2 and Trilogy Atrial fibrillation  CVA - on plavix aspirin =- Prior admission , sedentary due to CVA in October 2021, low activity level and balance issues.  THC use [place] abusle - quit summer 2022 AAA: Previous vascular interventions: EVAR 10/04/2016 by Dr.  VeTheodis Satowith relining of graft and extension of right limb due to type I endoleak. I&D of right groin for infection with wound vac placement 11/07/2016.   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   in other chart with MRN [id number] has hypercapnia - [facility] 05/10/21 and reported wearing trilogy 2h  per night at home   Significant Hospital Events: Including procedures, antibiotic start and stop dates in addition to other pertinent events   8/19 admit with abd pain, CT with concern for perforation to OR for ex-lap with abdominal colectomy, colostomy. Returned to ICU on vent.  MRSA PCR positive COVID PCR negative Flu PCR negative Left IJ 8/19  Foley 8/19 > 8/22 ETT 8/19  >>8/22 06/24/21 - NAEON. UOP a bit better, still relatively oliguric. Cr stable. Alert, on SBT. Anticipate extubation. 06/25/21 : Failed SBT yesterday.  Lasix given.  Yesterday afebrile.  White count down to 11 point 9K.  Remains on ventilator 40% FiO2.  On fentanyl infusion, Precedex infusion, Levophed infusion off.  Only on vasopressin infusion.  Getting magnesium sulfate and potassium.  Remains on Zosyn.  Surgery noted that his oliguria is improving and creatinine normalized and to anticipate third space losses given colectomy -extubated 8/23 remains extubated. Hypercapnic. Did not do much BiPAP due to NG tube. On amio gtt and rate controlled . This AM - agitated and confused  and hallucinating despite precedex gtt. Wheezing. Chart review shows severe Protein cal malnutn pre admit alb 2.,2. EKG now with NSR - QTc 511 mosec. On precedex, amio gtt, comparize prn.  Amiodarone discontinued due to prolonged QTC echocardiogram ordered.LVEF 60%.  Normal function.  Mild left ventricular hypertrophy RV function mildly reduced 8/24: Still confused.  Still on Precedex drip.  NG tube removed per surgery.  Modified  barium swallow ordered. 8/25 Precedex stopped.  No longer confused.  Slowly advancing diet.  Barium swallow did demonstrate mild dysphagia.  Recommendations made by SLP.  Fairly hypertensive, Norvasc increased, as needed hydralazine. .  No longer needs critical care we will ask medicine to continue to provide support.  Internal medicine contacted and asked to assume primary consultant duties for medical needs, pulmonary following for specific pulmonary problems.  Given his advanced lung disease and malignancy we did ask palliative care to see the patient in consult to assist with goals of care 07/02/21: Intermittently wearing BiPAP  Interim History / Subjective:  No events overnight. Off Precedex >72 hours. Tolerates BiPAP however will wear for only a few hours. Wife states difficult to convince him to keep it on. Objective   Blood pressure (!) 156/58, pulse 90, temperature 97.7 F (36.5 C), temperature source Axillary, resp. rate 20, height 6' (1.829 m), weight 77.5 kg, SpO2 94 %.    FiO2 (%):  [36 %] 36 %   Intake/Output Summary (Last 24 hours) at 07/02/2021 0734 Last data filed at 07/02/2021 [id number] Gross per 24 hour  Intake 20 ml  Output 825 ml  Net -805 ml   Filed Weights   06/30/21 0423 07/01/21 0500 07/02/21 0500  Weight: 77.1 kg 77.2 kg 77.5 kg   Physical Exam: General: Elderly-appearing, no acute distress HENT: [place], AT, OP clear, MMM Eyes: EOMI, no scleral icterus Respiratory: Diminished breath sounds bilaterally.  No crackles, wheezing or rales Cardiovascular: RRR, -M/R/G, no JVD GI: BS+, soft, tender, wound vac in plcae Extremities:-Edema,-tenderness Neuro: AAO x3, CNII-XII grossly intact Psych: Normal mood, normal affect  Resolved Hospital Problem list   Hypovolemic shock  Post-operative respiratory failure ->extubated 8/22 Acute Kidney Injury (POA) resolved Assessment & Plan:   Acute Bowel Perforation w/ Pneumatosis; 2/2 sigmoid stricture/obstruction due to T4N1  obstructing rectosigmoid cancer . Now s/p Open Colectomy with Ileostomy - present on  Chronic Hypoxic & hypercarbic respiratory failure 2/2 COPD w/ on-going tobacco abuse  Current every day smoker - 60ppd hx - present on admit Acute metabolic encephalopathy -new onset 06/26/2021 A. fib RVR 06/25/2021.  Prolonged QTC 511 ms-new 06/26/2021 HTN Anemia of blood loss and critical illness  Intermittent fluid and Electrolyte imbalance: hypophosphatemia, hypokalemia Hx Prostate Cancer  stage 4 micrometastic - baseline prior to and on admit Hx of bladder cancer in 2006 - early per patient on 06/28/2021 (MD add) History of chronic HLD Hx CVA October 2021- on DAPT prior to admit Severe protein calorie Malnutrition w/ cachexia (POA) Dysphagia, identified by modified barium swallow on 8/24 Failure to thrive   Pulm prob list  Chronic hypoxic and hypercarbic respiratory failure in the context of advanced chronic obstructive pulmonary disease and ongoing tobacco abuse Limited pulmonary reserve, actually on nocturnal CPAP at baseline due to advanced lung disease and pulmonary deconditioning On 2-4L O2 during the day and BiPAP at night. Appears comfortable and stable from pulmonary standpoint.  Plan Wean supplemental oxygen for goal >90% Continue nocturnal ventilation Continue scheduled bronchodilators, and inhaled corticosteroids (he is on Stiolto respimat at home.  Continue incentive spirometry and flutter valve Maximize mobility Dysphagia precautions given risk for aspiration Be careful with narcotics  Pulmonary follow-up information provided. Discussed plan with patient and his wife who will schedule patient for outpatient follow-up when he is ready.   Best Practice (right click and "Reselect all SmartList Selections" daily)  Diet/type: clear liquids and NPO DVT prophylaxis: prophylactic heparin  GI prophylaxis: PPI Lines: N/A - L IJ 8/19  Foley:  Placed8/19 - remove 8/22 Code Status:  full  code  Last date of multidisciplinary goals of care discussion - by CCS for 06/25/21. Wife update 07/02/21 Will sign off. Please call Pulmonary if needed.  Care Time: 58 min  Rodman Pickle, M.D. [facility] Pulmonary/Critical Care Medicine 07/02/2021 7:34 AM   Please see Amion for pager number to reach on-call Pulmonary and Critical Care Team.

## 2021-07-02 NOTE — TOC Progression Note (Addendum)
Transition of Care [facility]) - Progression Note    Patient Details  Name: Daniel King MRN: [id number] Date of Birth: 05/12/1946  Transition of [facility]) CM/SW Contact  Leeroy Cha, RN Phone Number: 07/02/2021, 8:46 AM  Clinical Narrative:    Significant Hospital Events: Including procedures, antibiotic start and stop dates in addition to other pertinent events   8/19 admit with abd pain, CT with concern for perforation to OR for ex-lap with abdominal colectomy, colostomy. Returned to ICU on vent.  MRSA PCR positive COVID PCR negative Flu PCR negative Left IJ 8/19  Foley 8/19 > 8/22 ETT 8/19  >>8/22 06/24/21 - NAEON. UOP a bit better, still relatively oliguric. Cr stable. Alert, on SBT. Anticipate extubation. 06/25/21 : Failed SBT yesterday.  Lasix given.  Yesterday afebrile.  White count down to 11 point 9K.  Remains on ventilator 40% FiO2.  On fentanyl infusion, Precedex infusion, Levophed infusion off.  Only on vasopressin infusion.  Getting magnesium sulfate and potassium.  Remains on Zosyn.  Surgery noted that his oliguria is improving and creatinine normalized and to anticipate third space losses given colectomy -extubated 8/23 remains extubated. Hypercapnic. Did not do much BiPAP due to NG tube. On amio gtt and rate controlled . This AM - agitated and confused and hallucinating despite precedex gtt. Wheezing. Chart review shows severe Protein cal malnutn pre admit alb 2.,2. EKG now with NSR - QTc 511 mosec. On precedex, amio gtt, comparize prn.  Amiodarone discontinued due to prolonged QTC echocardiogram ordered.LVEF 60%.  Normal function.  Mild left ventricular hypertrophy RV function mildly reduced 8/24: Still confused.  Still on Precedex drip.  NG tube removed per surgery.  Modified barium swallow ordered. 8/25 Precedex stopped.  No longer confused.  Slowly advancing diet.  Barium swallow did demonstrate mild dysphagia.  Recommendations made by SLP.  Fairly hypertensive, Norvasc  increased, as needed hydralazine. .  No longer needs critical care we will ask medicine to continue to provide support.  Internal medicine contacted and asked to assume primary consultant duties for medical needs, pulmonary following for specific pulmonary problems.  Given his advanced lung disease and malignancy we did ask palliative care to see the patient in consult to assist with goals of care 07/02/21: Intermittently wearing BiPAP TOC PLAN OF CARE: following the above problems, pt on bipap prn and 4l [place] o2 otherwise.  Iv mgs04 2 gms x1, hgb 8.9 Following to see when ready for snf.  [facility]. [facility] and [facility] have made bed offers. Expected Discharge Plan: Home/Self Care Barriers to Discharge: Continued Medical Work up  Expected Discharge Plan and Services Expected Discharge Plan: Home/Self Care   Discharge Planning Services: CM Consult   Living arrangements for the past 2 months: Single Family Home                                       Social Determinants of Health (SDOH) Interventions    Readmission Risk Interventions No flowsheet data found.

## 2021-07-02 NOTE — Progress Notes (Signed)
Progress Note  10 Days Post-Op  Subjective: Patient reports abdominal pain. Tolerating D3 diet and having ileostomy output. Family member at bedside this AM and reports some mild confusion.   Objective: Vital signs in last 24 hours: Temp:  [97.7 F (36.5 C)-98.1 F (36.7 C)] 97.9 F (36.6 C) (08/29 0751) Pulse Rate:  [69-90] 85 (08/29 0800) Resp:  [14-24] 21 (08/29 0800) BP: (108-156)/(36-100) 147/72 (08/29 0800) SpO2:  [76 %-100 %] 99 % (08/29 0800) FiO2 (%):  [36 %] 36 % (08/29 0730) Weight:  [77.5 kg] 77.5 kg (08/29 0500) Last BM Date: 07/02/21  Intake/Output from previous day: 08/28 0701 - 08/29 0700 In: 20 [I.V.:20] Out: 825 [Urine:640; Stool:185] Intake/Output this shift: Total I/O In: -  Out: 40 [Stool:40]  PE: Gen: Alert, cooperative CV: RRR Pulm: unlabored, sat 99% on [place] Abd: Soft, nondistended, minimally tender. Midline wound VAC in place with serosanguineous output; stoma viable, liquid stool in the bag as well as air Ext: SCDs in place   Lab Results:  Recent Labs    07/01/21 0257 07/02/21 0301  WBC 10.3 8.7  HGB 8.5* 8.9*  HCT 28.3* 29.5*  PLT 373 377   BMET Recent Labs    07/01/21 0257 07/02/21 0301  NA 140 138  K 4.0 4.2  CL 98 97*  CO2 36* 35*  GLUCOSE 98 93  BUN 10 10  CREATININE 0.64 0.65  CALCIUM 8.6* 8.9   PT/INR No results for input(s): LABPROT, INR in the last 72 hours. CMP     Component Value Date/Time   NA 138 07/02/2021 0301   K 4.2 07/02/2021 0301   CL 97 (L) 07/02/2021 0301   CO2 35 (H) 07/02/2021 0301   GLUCOSE 93 07/02/2021 0301   BUN 10 07/02/2021 0301   CREATININE 0.65 07/02/2021 0301   CALCIUM 8.9 07/02/2021 0301   PROT 5.3 (L) 07/02/2021 0301   ALBUMIN 2.5 (L) 07/02/2021 0301   AST 11 (L) 07/02/2021 0301   ALT 21 07/02/2021 0301   ALKPHOS 62 07/02/2021 0301   BILITOT 0.9 07/02/2021 0301   GFRNONAA >60 07/02/2021 0301   Lipase  No results found for: LIPASE     Studies/Results: No results  found.  Anti-infectives: Anti-infectives (From admission, onward)    Start     Dose/Rate Route Frequency Ordered Stop   06/11/2021 1400  piperacillin-tazobactam (ZOSYN) IVPB 3.375 g        3.375 g 12.5 mL/hr over 240 Minutes Intravenous Every 8 hours 06/07/2021 1357 06/27/21 1016   06/21/2021 0745  cefoTEtan (CEFOTAN) 2 g in sodium chloride 0.9 % 100 mL IVPB        2 g 200 mL/hr over 30 Minutes Intravenous On call to O.R. 07/04/2021 0740 06/17/2021 0831   06/18/2021 0745  sodium chloride 0.9 % with cefoTEtan (CEFOTAN) ADS Med       Note to Pharmacy: Charmayne Sheer   : cabinet override      06/07/2021 0745 06/14/2021 0854   06/23/2021 0700  piperacillin-tazobactam (ZOSYN) IVPB 3.375 g        3.375 g 100 mL/hr over 30 Minutes Intravenous  Once 07/04/2021 0653 07/01/2021 0733        Assessment/Plan POD10 Ex-lap with TAC and end ileostomy Dr. Johney Maine 06/05/2021 - tolerating D3 diet and having ileostomy output  - WOC following, VAC to midline - can likely DC VAC soon if wound shallow - work on PO pain control - scheduled tylenol and added scheduled motrin and scheduled robaxin  today - continue to mobilize as tolerated - PT/OT recommending SNF at this time - pathology: T4N1 obstructing rectosigmoid cancer.  Dr. Johney Maine has initiated arrangements for GI tumor board and outpatient oncology follow-up. Patient and family are aware   FEN: D3 diet, supplements VTE: SQ heparin, plavix ID: no current abx  Chronic hypoxic and hypercarbic respiratory failure in setting of advanced COPD and current tobacco abuse - per CCM/TRH, appreciate assistance  Acute metabolic encephalopathy - delirium precautions, some confusion reported this AM A. Fib with RVR and prolonged QTc HTN ABL anemia on likely anemia of chronic disease GERD Hx of bladder cancer stage IV HLD Hx of CVA Oct 2021 Severe protein calorie malnutrition  Dysphagia - SLP following, on D3 diet  FTT  LOS: 10 days    Norm Parcel, [facility]  Surgery 07/02/2021, 10:03 AM Please see Amion for pager number during day hours 7:00am-4:30pm

## 2021-07-02 NOTE — Progress Notes (Signed)
Notified Lab that ABG being sent for analysis. 

## 2021-07-03 ENCOUNTER — Inpatient Hospital Stay (HOSPITAL_COMMUNITY)
Admit: 2021-07-03 | Discharge: 2021-07-03 | Disposition: A | Discharge status: Home / Self Care | Payer: No Typology Code available for payment source | Attending: Internal Medicine | Admitting: Internal Medicine

## 2021-07-03 ENCOUNTER — Inpatient Hospital Stay (HOSPITAL_COMMUNITY): Payer: No Typology Code available for payment source

## 2021-07-03 ENCOUNTER — Telehealth: Payer: Self-pay | Admitting: Hematology

## 2021-07-03 DIAGNOSIS — R252 Cramp and spasm: Secondary | ICD-10-CM

## 2021-07-03 LAB — CBC WITH DIFFERENTIAL/PLATELET
Abs Immature Granulocytes: 0.37 10*3/uL — ABNORMAL HIGH (ref 0.00–0.07)
Basophils Absolute: 0.1 10*3/uL (ref 0.0–0.1)
Basophils Relative: 1 %
Eosinophils Absolute: 0.3 10*3/uL (ref 0.0–0.5)
Eosinophils Relative: 3 %
HCT: 29.4 % — ABNORMAL LOW (ref 39.0–52.0)
Hemoglobin: 9 g/dL — ABNORMAL LOW (ref 13.0–17.0)
Immature Granulocytes: 4 %
Lymphocytes Relative: 8 %
Lymphs Abs: 0.8 10*3/uL (ref 0.7–4.0)
MCH: 27.9 pg (ref 26.0–34.0)
MCHC: 30.6 g/dL (ref 30.0–36.0)
MCV: 91 fL (ref 80.0–100.0)
Monocytes Absolute: 1 10*3/uL (ref 0.1–1.0)
Monocytes Relative: 10 %
Neutro Abs: 7.1 10*3/uL (ref 1.7–7.7)
Neutrophils Relative %: 74 %
Platelets: 375 10*3/uL (ref 150–400)
RBC: 3.23 MIL/uL — ABNORMAL LOW (ref 4.22–5.81)
RDW: 18.9 % — ABNORMAL HIGH (ref 11.5–15.5)
WBC: 9.6 10*3/uL (ref 4.0–10.5)
nRBC: 0 % (ref 0.0–0.2)

## 2021-07-03 LAB — MAGNESIUM: Magnesium: 2 mg/dL (ref 1.7–2.4)

## 2021-07-03 LAB — COMPREHENSIVE METABOLIC PANEL
ALT: 18 U/L (ref 0–44)
AST: 11 U/L — ABNORMAL LOW (ref 15–41)
Albumin: 2.5 g/dL — ABNORMAL LOW (ref 3.5–5.0)
Alkaline Phosphatase: 60 U/L (ref 38–126)
Anion gap: 8 (ref 5–15)
BUN: 12 mg/dL (ref 8–23)
CO2: 32 mmol/L (ref 22–32)
Calcium: 8.9 mg/dL (ref 8.9–10.3)
Chloride: 98 mmol/L (ref 98–111)
Creatinine, Ser: 0.75 mg/dL (ref 0.61–1.24)
GFR, Estimated: >60 mL/min (ref >60)
Glucose, Bld: 96 mg/dL (ref 70–99)
Potassium: 4.4 mmol/L (ref 3.5–5.1)
Sodium: 138 mmol/L (ref 135–145)
Total Bilirubin: 0.8 mg/dL (ref 0.3–1.2)
Total Protein: 5.4 g/dL — ABNORMAL LOW (ref 6.5–8.1)

## 2021-07-03 LAB — GLUCOSE, CAPILLARY
Glucose-Capillary: 113 mg/dL — ABNORMAL HIGH (ref 70–99)
Glucose-Capillary: 118 mg/dL — ABNORMAL HIGH (ref 70–99)
Glucose-Capillary: 123 mg/dL — ABNORMAL HIGH (ref 70–99)
Glucose-Capillary: 95 mg/dL (ref 70–99)
Glucose-Capillary: 96 mg/dL (ref 70–99)
Glucose-Capillary: 99 mg/dL (ref 70–99)

## 2021-07-03 LAB — PHOSPHORUS: Phosphorus: 3.8 mg/dL (ref 2.5–4.6)

## 2021-07-03 LAB — AMMONIA: Ammonia: 25 umol/L (ref 9–35)

## 2021-07-03 MED ORDER — GADOBUTROL 1 MMOL/ML IV SOLN
7.5000 mL | Freq: Once | INTRAVENOUS | Status: AC | PRN
  Administered 2021-07-03: 7.5 mL via INTRAVENOUS

## 2021-07-03 MED ORDER — ORAL CARE MOUTH RINSE
15.0000 mL | Freq: Two times a day (BID) | OROMUCOSAL | Status: DC
  Administered 2021-07-03 – 2021-07-07 (×9): 15 mL via OROMUCOSAL

## 2021-07-03 NOTE — Progress Notes (Signed)
PROGRESS NOTE    Daniel King  UXN:[id number] DOB: Nov 19, 1945 DOA: 06/08/2021 PCP: Henreitta Cea, MD   Brief Narrative:  Patient is a 75 year old Caucasian male who presented with 24 hours of worsening abdominal pain on 06/27/2021 with a last bowel movement on 818.  He was noted to have abdominal distention and tenderness.  CT of the abdomen was done and assessed with concern for primary colon cancer in the mid sigmoid colon complicated by colonic obstruction with pneumatosis involving the cecum and ascending colon, assistant with ischemic colitis.  He then had evidence of bowel perforation with small volume pneumoperitoneum.  He was evaluated by the general surgery team and recommended an open colectomy and ileostomy.  He was on Plavix prior to admission he was started on IV Zosyn.  Intraoperative findings were notable for massively dilated colon, cecum and ascending colon with ischemia and patches of phlegmon and gangrene of approximately 3 feet of colon was removed.  There was small volume pneumoperitoneum and rocky hard rectosigmoid mass worrisome for malignancy.  He had intraoperative hypotension requiring vasopressors return to ICU postsurgery on mechanical ventilation and postop hemoglobin was 7.6 was given 1 unit PRBCs.  PCCM was consulted for assistance with ICU care and then subsequently patient was extubated on 06/26/2021.  He has some postoperative confusion and delirium and was still confused but was placed on Precedex drip.  06/28/2021 Precedex drip was stopped and SLP evaluated and noted mild dysphagia.  He was switched over to [facility] hospitalist managing multiple medical issues on 06/29/2021.  Currently is clinical course is been the following: 8/19 admit with abd pain, CT with concern for perforation to OR for ex-lap with abdominal colectomy, colostomy. Returned to ICU on vent.  MRSA PCR positive COVID PCR negative Flu PCR negative Left IJ 8/19  Foley 8/19 > 8/22 ETT 8/19   >>8/22 06/24/21 - NAEON. UOP a bit better, still relatively oliguric. Cr stable. Alert, on SBT. Anticipate extubation. 06/25/21 : Failed SBT yesterday.  Lasix given.  Yesterday afebrile.  White count down to 11 point 9K.  Remains on ventilator 40% FiO2.  On fentanyl infusion, Precedex infusion, Levophed infusion off.  Only on vasopressin infusion.  Getting magnesium sulfate and potassium.  Remains on Zosyn.  Surgery noted that his oliguria is improving and creatinine normalized and to anticipate third space losses given colectomy -extubated 8/23 remains extubated. Hypercapnic. Did not do much BiPAP due to NG tube. On amio gtt and rate controlled . This AM - agitated and confused and hallucinating despite precedex gtt. Wheezing. Chart review shows severe Protein cal malnutn pre admit alb 2.,2. EKG now with NSR - QTc 511 mosec. On precedex, amio gtt, comparize prn.  Amiodarone discontinued due to prolonged QTC echocardiogram ordered.LVEF 60%.  Normal function.  Mild left ventricular hypertrophy RV function mildly reduced 8/24: Still confused.  Still on Precedex drip.  NG tube removed per surgery.  Modified barium swallow ordered. 8/25 Precedex stopped.  No longer confused.  Slowly advancing diet.  Barium swallow did demonstrate mild dysphagia.  Recommendations made by SLP.  Fairly hypertensive, Norvasc increased, as needed hydralazine. .  No longer needs critical care we will ask medicine to continue to provide support.  Internal medicine contacted and asked to assume primary consultant duties for medical needs, pulmonary following for specific pulmonary problems.  Given his advanced lung disease and malignancy we did ask palliative care to see the patient in consult to assist with goals of care 7.  Respiratory status has been  has been improving however he is more somnolent on 07/02/2021 so we will obtain a ABG.  Patient was not compliant with his BiPAP last night.  Surgery rectum and continue dysphagia 3 diet and  they are considering exchanging or replacing his wound VAC if he continues to have issues.  Supplemental oxygen has been weaning.  Pulmonary has  signed off the case but he has been intermittently wearing BiPAP.  He continues to be on 2 to 4 L of supplemental oxygen during the day and BiPAP at night if he tolerates it goal supplemental oxygen of greater than 90%  8.  From a medical standpoint he is improved and from a surgical standpoint they feel that the patient can be discharged to SNF however today the patient's wife noticed that the patient was sudden jerking movements and tremors.  We will order an EEG and ammonia level.  Patient's confusion is not as bad.  Surgeries plan transfer to floor and recommending discharging to SNF.  Patient is to follow-up with Dr. 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  -Now s/p Open Colectomy with Ileostomy - present on Admisison -Neurosurgery is primary and he received 5 days of Zosyn and this ended 06/27/2021 -Currently on a dysphagia 3 diet per SLP recommendation -WOC Nurse involved [place] changed today -General surgery recommending SCDs and PPI starting subcu heparin on 821 -Pathology results were discussed by the general surgery team to the patient and the patient's family and showed a T4N1 obstructing rectosigmoid tumor -Findings will be present at the GI tumor board and outpatient oncology follow-up will be needed -PT OT recommending SNF  and per surgery they will need to work on continue mobilizing and his work on his nutrition but they don't want to advance his diet past D3 so will repeat a SLP evaluation -Surgery using mulimodal Pain control but will now change his medications given his somnolence -Somnolence has improved and he is being transferred to medical floor with eventual discharge to SNF  Chronic hypoxic and hypercarbic respiratory failure in the context of advanced chronic obstructive pulmonary disease and ongoing tobacco abuse Current every day smoker - 60ppd hx - present on admit -Pulmonary was managing -Per Pulmonary: -Limited pulmonary reserve, actually on nocturnal CPAP at baseline due to advanced lung disease and pulmonary deconditioning Is actually doing quite well from a pulmonary standpoint.  His primary issues in the intensive care have been delirium, which is improved, and postoperative hypertension.  Overall however his postoperative course has gone as well as could be expected given his comorbidities.  I have asked the internal medicine service to assume his care in regards to hypertension, arrhythmia, electrolyte management.  We are still going to follow intermittently for his underlying lung disease -Continue supplemental oxygen, with nocturnal while here in the intensive care Continue scheduled bronchodilators, and inhaled corticosteroids (he is on Stiolto respimat at home.  Adding flutter valve -ABG    Component Value Date/Time   PHART 7.375 07/02/2021 1035   PCO2ART 59.0 (H) 07/02/2021  1035   PO2ART 63.9 (L) 07/02/2021 1035   HCO3 33.7 (H) 07/02/2021 1035   ACIDBASEDEF 0.4 07/03/2021 1130   O2SAT 91.0 07/02/2021 1035    Maximize mobility Dysphagia precautions given risk for aspiration Be careful with narcotics -Pulmonary signing off the case now and he continues to be on 2 to 4 L during the day and intermittent BiPAP at night and nightly -Repeat chest x-ray in a.m.  Acute Metabolic  Encephalopathy  -New onset 06/26/2021 -His delirium confusion is improved and he is off of Precedex drip now as it was stopped 06/28/2021 -Continues to Have some Confusion and was more somnolent -Delirium Precautions; much more alert yesterday after using BiPAP however he was confused and somnolent this morning after he did not wear his BiPAP.  ABG done showing an elevated CO2 -Checking ammonia level given his sudden jerking movements  A. fib RVR 06/25/2021.  Prolonged QTC 511 ms-new 06/26/2021 HTN -C/w Telemetery Monitoring  -Currently on amlodipine 10 mg p.o. daily and Hydralazine 20 mg IV q4hprn SBP >170  -Continue with Metoprolol Tartrate 12.5 g p.o. twice daily -Heart rate seems to be improved  Anemia of blood loss and critical illness  -Patient's Hgb/Hct went from 8.9/28.9 -> 9.8/32.4 -> 9.0/29.3 -> 8.5/28.3 -> 8.9/29.5 -> 9.0/29.4 -Checked anemia panel and showed an iron level of 40, U IBC of 295, TIBC of 335, saturation ratios of 12%, ferritin level of 86, folate level 8.5, and vitamin B12 level 767 -Continue to Monitor for S/Sx of Bleeding; No overt bleeding noted -Repeat CBC in the AM   Hypomagnesemia -Magnesium levels improved to 2.0 -Replete with IV mag sulfate 2 g yesterday -Continue to monitor and replete as necessary -Repeat magnesium level in a.m.  Sudden jerking and tremors -Check EEG and pending read -Check ammonia level and was 25 -Unclear etiology but he has had sudden jerking of both arms and sometimes his head as well. every time he sleeps, he starts jerking and kind of twitching per the patient's wife -Discuss with Neurology Dr. Quinn Axe who recommends MRI Brain w/wo Contrast give his recently diagnosed Cancer to evaluate for Metastatic Lesions  -Seizure Precautions   GERD/GI Prohylaxis -Continue with Pantoprazole 40 mg daily  Hx Prostate Cancer  stage 4 micrometastic - baseline prior to and on admit  Hx of Bladder Cancer in 2006 - early per patient on 06/28/2021  (MD add)  History of chronic HLD  Hx CVA October 2021- on DAPT prior to admit and this has been resumed  Severe protein calorie Malnutrition w/ cachexia (POA) Dysphagia, identified by modified barium swallow on 8/24 Failure to thrive  -Nutritionist consulted and recommending feeding supplements with boost breeze 1 container p.o. 3 times daily -C/w D3 Diet and repeat SLP evaluation   DVT prophylaxis: Heparin 5,000 units sq q8h Code Status: FULL CODE Family Communication: No family present at bedside  Disposition Plan: Pending further clinical improvement and anticipate discharging to SNF next 48 hours  Status is: Inpatient  Remains inpatient appropriate because:Unsafe d/c plan, IV treatments appropriate due to intensity of illness or inability to take PO, and Inpatient level of care appropriate due to severity of illness  Dispo: The patient is from: Home              Anticipated d/c is to: SNF              Patient currently is not medically stable to d/c.   Difficult to place patient No  Consultants:  General surgery PCCM  TRH Discussed w Neurology   Procedures: EXPLORATORY LAPAROTOMY ABDOMINAL COLECTOMY WITH END ILEOSTOMY 07/01/2021  Antimicrobials:  Anti-infectives (From admission, onward)    Start     Dose/Rate Route Frequency Ordered Stop   06/10/2021 1400  piperacillin-tazobactam (ZOSYN) IVPB 3.375 g        3.375 g 12.5 mL/hr over 240 Minutes Intravenous Every 8 hours 06/06/2021 1357 06/27/21 1016   06/14/2021 0745  cefoTEtan (CEFOTAN) 2 g in sodium chloride 0.9 % 100 mL IVPB        2 g 200 mL/hr over 30 Minutes Intravenous On call to O.R. 07/01/2021 0740 06/07/2021 0831   06/10/2021 0745  sodium chloride 0.9 % with cefoTEtan (CEFOTAN) ADS Med       Note to Pharmacy: Charmayne Sheer   : cabinet override      06/21/2021 0745 06/27/2021 0854   06/15/2021 0700  piperacillin-tazobactam (ZOSYN) IVPB 3.375 g        3.375 g 100 mL/hr over 30 Minutes Intravenous  Once 06/28/2021 0653 06/18/2021  0733        Subjective: Seen and examined at bedside he is awake and sitting in a chair at bedside.  Not as confused.  Nursing reported that after I left the room the wife indicated that patient started having sudden jerking movements of his arms and his head especially when he is falling asleep or sleep.  Discussed with neurology and will obtain MRI and EEG.  From medical standpoint is improved and his respiratory status was stable today.  No other concerns or complaints at this time.  Objective: Vitals:   07/03/21 0812 07/03/21 1100 07/03/21 1114 07/03/21 1200  BP:  (!) 109/46 (!) 109/46 (!) 85/62  Pulse:  95  79  Resp:  20  18  Temp:    97.6 F (36.4 C)  TempSrc:    Axillary  SpO2: 100% 99%  100%  Weight:      Height:        Intake/Output Summary (Last 24 hours) at 07/03/2021 1513 Last data filed at 07/03/2021 0538 Gross per 24 hour  Intake --  Output 800 ml  Net -800 ml    Filed Weights   07/01/21 0500 07/02/21 0500 07/03/21 0500  Weight: 77.2 kg 77.5 kg 72.5 kg   Examination: Physical Exam:  Constitutional: Chronically ill-appearing elderly Caucasian male who is more awake and alert and in a chair not as confused Eyes: Lids and conjunctivae normal, sclerae anicteric  ENMT: External Ears, Nose appear normal. Grossly normal hearing. Neck: Appears normal, supple, no cervical masses, normal ROM, no appreciable thyromegaly; no JVD Respiratory: Diminished to auscultation bilaterally, no wheezing, rales, rhonchi or crackles. Normal respiratory effort and patient is not tachypenic. No accessory muscle use.  Wearing supplemental oxygen via nasal cannula Cardiovascular: RRR, no murmurs / rubs / gallops. S1 and S2 auscultated.  Some edema Abdomen: Soft, non-tender, non-distended.  He has a midline wound VAC in place and a colostomy noted. Bowel sounds positive.  GU: Deferred. Musculoskeletal: No clubbing / cyanosis of digits/nails. No joint deformity upper and lower  extremities Skin: No rashes, lesions, ulcers on limited skin evaluation. No induration; Warm and dry.  Neurologic: CN 2-12 grossly intact with no focal deficits. Romberg sign and cerebellar reflexes not assessed.  Psychiatric: Mildly impaired judgment and insight. Alert and oriented x 2. Normal mood and appropriate affect.   Data Reviewed: I have personally reviewed following labs and imaging studies  CBC: Recent Labs  Lab 06/29/21 0253 06/30/21 0303  07/01/21 0257 07/02/21 0301 07/03/21 0243  WBC 11.5* 11.8* 10.3 8.7 9.6  NEUTROABS  --  9.0* 7.7 6.4 7.1  HGB 9.8* 9.0* 8.5* 8.9* 9.0*  HCT 32.4* 29.3* 28.3* 29.5* 29.4*  MCV 90.0 89.3 91.3 91.9 91.0  PLT 406* 389 373 [id number]    Basic Metabolic Panel: Recent Labs  Lab 06/29/21 0253 06/30/21 0303 07/01/21 0257 07/02/21 0301 07/02/21 1907 07/03/21 0243  NA 135 136 140 138 140 138  K 4.0 4.1 4.0 4.2 4.4 4.4  CL 96* 97* 98 97* 99 98  CO2 31 33* 36* 35* 33* 32  GLUCOSE 118* 86 98 93 122* 96  BUN '9 11 10 10 11 12  '$ CREATININE 0.60* 0.61 0.64 0.65 0.68 0.75  CALCIUM 8.1* 8.4* 8.6* 8.9 9.1 8.9  MG 2.0 1.9 1.8 1.6*  --  2.0  PHOS 2.4* 2.9 3.2 3.7  --  3.8    GFR: Estimated Creatinine Clearance: 81.8 mL/min (by C-G formula based on SCr of 0.75 mg/dL). Liver Function Tests: Recent Labs  Lab 06/27/21 0610 06/30/21 0303 07/01/21 0257 07/02/21 0301 07/03/21 0243  AST 42* 13* 13* 11* 11*  ALT 84* 35 '27 21 18  '$ ALKPHOS 72 64 63 62 60  BILITOT 1.3* 0.9 0.8 0.9 0.8  PROT 5.1* 5.2* 5.3* 5.3* 5.4*  ALBUMIN 2.2* 2.4* 2.4* 2.5* 2.5*    No results for input(s): LIPASE, AMYLASE in the last 168 hours. Recent Labs  Lab 07/03/21 1254  AMMONIA 25   Coagulation Profile: No results for input(s): INR, PROTIME in the last 168 hours. Cardiac Enzymes: No results for input(s): CKTOTAL, CKMB, CKMBINDEX, TROPONINI in the last 168 hours. BNP (last 3 results) No results for input(s): PROBNP in the last 8760 hours. HbA1C: No results for  input(s): HGBA1C in the last 72 hours. CBG: Recent Labs  Lab 07/02/21 1929 07/03/21 0054 07/03/21 0446 07/03/21 0722 07/03/21 1112  GLUCAP 135* 95 96 99 118*    Lipid Profile: No results for input(s): CHOL, HDL, LDLCALC, TRIG, CHOLHDL, LDLDIRECT in the last 72 hours. Thyroid Function Tests: No results for input(s): TSH, T4TOTAL, FREET4, T3FREE, THYROIDAB in the last 72 hours. Anemia Panel: Recent Labs    07/02/21 0301  VITAMINB12 767  FOLATE 8.5  FERRITIN 86  TIBC 335  IRON 40*  RETICCTPCT 3.4*    Sepsis Labs: No results for input(s): PROCALCITON, LATICACIDVEN in the last 168 hours.  No results found for this or any previous visit (from the past 240 hour(s)).    RN Pressure Injury Documentation: Pressure Injury 06/25/21 Coccyx Medial;Right;Left Deep Tissue Pressure Injury - Purple or maroon localized area of discolored intact skin or blood-filled blister due to damage of underlying soft tissue from pressure and/or shear. (Active)  06/25/21 1300  Location: Coccyx  Location Orientation: Medial;Right;Left  Staging: Deep Tissue Pressure Injury - Purple or maroon localized area of discolored intact skin or blood-filled blister due to damage of underlying soft tissue from pressure and/or shear.  Wound Description (Comments):   Present on Admission:     Estimated body mass index is 21.68 kg/m as calculated from the following:   Height as of this encounter: 6' (1.829 m).   Weight as of this encounter: 72.5 kg.  Malnutrition Type:  Nutrition Problem: Moderate Malnutrition Etiology: chronic illness (COPD)  Malnutrition Characteristics:  Signs/Symptoms: mild fat depletion, mild muscle depletion, moderate muscle depletion  Nutrition Interventions:  Interventions: Refer to RD note for recommendations  Radiology Studies: No results found.  Scheduled Meds:  acetaminophen  650 mg Oral Q6H   amLODipine  10 mg Oral Daily   aspirin EC  81 mg Oral Daily   budesonide  (PULMICORT) nebulizer solution  0.5 mg Nebulization BID   Chlorhexidine Gluconate Cloth  6 each Topical Daily   clopidogrel  75 mg Oral Daily   feeding supplement  1 Container Oral TID BM   folic acid  1 mg Oral Daily   heparin injection (subcutaneous)  5,000 Units Subcutaneous Q8H   ibuprofen  400 mg Oral TID   ipratropium-albuterol  3 mL Nebulization Q4H WA   iron polysaccharides  150 mg Oral Daily   lip balm  1 application Topical BID   mouth rinse  15 mL Mouth Rinse BID   methocarbamol  500 mg Oral TID   metoprolol tartrate  12.5 mg Oral BID   pantoprazole  40 mg Oral Daily   thiamine  100 mg Oral Daily   Continuous Infusions:  sodium chloride 10 mL/hr at 07/02/21 1112    LOS: 11 days   Kerney Elbe, DO [facility] PAGER is on AMION  If 7PM-7AM, please contact night-coverage [email or web address]

## 2021-07-03 NOTE — Procedures (Signed)
Patient Name: Daniel King  MRN: [id number]  Epilepsy Attending: Lora Havens  Referring Physician/Provider: Dr Carlisle Cater Date: 07/03/2021 Duration: 22.10 mins  Patient history: 75yo m with sudden jerking movements. EEG to evaluate for seizure  Level of alertness: Awake, drowsy  AEDs during EEG study: None  Technical aspects: This EEG study was done with scalp electrodes positioned according to the 10-20 International system of electrode placement. Electrical activity was acquired at a sampling rate of '500Hz'$  and reviewed with a high frequency filter of '70Hz'$  and a low frequency filter of '1Hz'$ . EEG data were recorded continuously and digitally stored.   Description: The posterior dominant rhythm consists of 7.5 Hz activity of moderate voltage (25-35 uV) seen predominantly in posterior head regions, symmetric and reactive to eye opening and eye closing. Drowsiness was characterized by attenuation of the posterior background rhythm. EEG showed intermittent generalized 3 to 6 Hz theta-delta slowing. Intermittent episodes of right and left extremity brief jerks were noted. Concomitant EEG before, during and after the event did not show any EEG changes suggest seizure. Hyperventilation and photic stimulation were not performed.     ABNORMALITY - Intermittent slow, generalized  IMPRESSION: This study is suggestive of mild diffuse encephalopathy, nonspecific etiology. No seizures or epileptiform discharges were seen throughout the recording.  Intermittent episodes of right and left extremity brief jerks were noted without concomitant EEG change. These episode were NOT epileptic.   Charleton Deyoung Barbra Sarks

## 2021-07-03 NOTE — Progress Notes (Signed)
EEG Completed; Results Pending  

## 2021-07-03 NOTE — Telephone Encounter (Signed)
Scheduled new pt appt per RN Santiago Glad. Spoke to pt's wife. She said that pt is getting ready to be moved to a facitlity and she's not sure what their schedule will look like. She said she will call back to confirm his new pt appt. I gave her my name and direct phone number to call back to confirm appt.

## 2021-07-03 NOTE — Progress Notes (Signed)
Progress Note  11 Days Post-Op  Subjective: Patient reports some abdominal pain with lying flat. Pain overall is much better controlled with adjustments made yesterday. Patient was more compliant with oxygen overnight. Some confusion reported this AM but patient and family member at bedside report it seems lessened.   Objective: Vital signs in last 24 hours: Temp:  [97.6 F (36.4 C)-97.9 F (36.6 C)] 97.9 F (36.6 C) (08/30 0100) Pulse Rate:  [74-95] 89 (08/30 0503) Resp:  [16-26] 23 (08/30 0503) BP: (89-174)/(32-123) 169/71 (08/30 0503) SpO2:  [88 %-100 %] 100 % (08/30 0606) Weight:  [72.5 kg] 72.5 kg (08/30 0500) Last BM Date: 07/02/21  Intake/Output from previous day: 08/29 0701 - 08/30 0700 In: 412 [I.V.:362; IV Piggyback:50] Out: 840 [Urine:700; Stool:140] Intake/Output this shift: No intake/output data recorded.  PE: Gen: Alert, cooperative CV: RRR Pulm: unlabored, sat 99% on [place] Abd: Soft, nondistended, minimally tender. Midline wound clean without active bleeding; stoma viable, soft stool in the bag as well as air Ext: SCDs in place   Lab Results:  Recent Labs    07/02/21 0301 07/03/21 0243  WBC 8.7 9.6  HGB 8.9* 9.0*  HCT 29.5* 29.4*  PLT 377 375   BMET Recent Labs    07/02/21 1907 07/03/21 0243  NA 140 138  K 4.4 4.4  CL 99 98  CO2 33* 32  GLUCOSE 122* 96  BUN 11 12  CREATININE 0.68 0.75  CALCIUM 9.1 8.9   PT/INR No results for input(s): LABPROT, INR in the last 72 hours. CMP     Component Value Date/Time   NA 138 07/03/2021 0243   K 4.4 07/03/2021 0243   CL 98 07/03/2021 0243   CO2 32 07/03/2021 0243   GLUCOSE 96 07/03/2021 0243   BUN 12 07/03/2021 0243   CREATININE 0.75 07/03/2021 0243   CALCIUM 8.9 07/03/2021 0243   PROT 5.4 (L) 07/03/2021 0243   ALBUMIN 2.5 (L) 07/03/2021 0243   AST 11 (L) 07/03/2021 0243   ALT 18 07/03/2021 0243   ALKPHOS 60 07/03/2021 0243   BILITOT 0.8 07/03/2021 0243   GFRNONAA >60 07/03/2021 0243    Lipase  No results found for: LIPASE     Studies/Results: No results found.  Anti-infectives: Anti-infectives (From admission, onward)    Start     Dose/Rate Route Frequency Ordered Stop   06/17/2021 1400  piperacillin-tazobactam (ZOSYN) IVPB 3.375 g        3.375 g 12.5 mL/hr over 240 Minutes Intravenous Every 8 hours 06/28/2021 1357 06/27/21 1016   06/18/2021 0745  cefoTEtan (CEFOTAN) 2 g in sodium chloride 0.9 % 100 mL IVPB        2 g 200 mL/hr over 30 Minutes Intravenous On call to O.R. 06/24/2021 0740 07/04/2021 0831   06/18/2021 0745  sodium chloride 0.9 % with cefoTEtan (CEFOTAN) ADS Med       Note to Pharmacy: Charmayne Sheer   : cabinet override      06/28/2021 0745 07/03/2021 0854   06/20/2021 0700  piperacillin-tazobactam (ZOSYN) IVPB 3.375 g        3.375 g 100 mL/hr over 30 Minutes Intravenous  Once 07/03/2021 0653 07/04/2021 0733        Assessment/Plan POD11 Ex-lap with TAC and end ileostomy Dr. Johney Maine 06/13/2021 - tolerating diet and having ileostomy output  - WOC following, WTD to midline wound BID - PO pain control seems improved - continue to mobilize as tolerated - PT/OT recommending SNF at this time -  pathology: T4N1 obstructing rectosigmoid cancer.  Dr. Johney Maine has initiated arrangements for GI tumor board and outpatient oncology follow-up. Patient and family are aware    FEN: reg diet, supplements VTE: SQ heparin, plavix ID: no current abx  Dispo: transfer to tele floor. Continue therapies. Stable for discharge from a surgical perspective. Will touch base with TOC today.    Chronic hypoxic and hypercarbic respiratory failure in setting of advanced COPD and current tobacco abuse - per CCM/TRH, appreciate assistance  Acute metabolic encephalopathy - delirium precautions, some confusion reported this AM but improving A. Fib with RVR and prolonged QTc HTN ABL anemia on likely anemia of chronic disease GERD Hx of bladder cancer stage IV HLD Hx of CVA Oct 2021 Severe  protein calorie malnutrition  Dysphagia - SLP following, reg diet FTT  LOS: 11 days    Norm Parcel, [facility] 07/03/2021, 7:45 AM Please see Amion for pager number during day hours 7:00am-4:30pm

## 2021-07-03 NOTE — Progress Notes (Signed)
I reached out to Ms Daniel King to schedule a consult appt with Dr Burr Medico for 07/23/2021.  She said she was not ready to make this appt and she would call back.  I told her I would watch Mr Daniel King's progress and call later.

## 2021-07-03 NOTE — TOC Progression Note (Addendum)
Transition of Care [facility]) - Progression Note    Patient Details  Name: Argo Mccowin MRN: [id number] Date of Birth: 1946-07-16  [facility] CM/SW Contact  Leeroy Cha, RN Phone Number: 07/03/2021, 9:01 AM  Clinical Narrative:    Reviewed two snf that have made bed offers on patient [facility]. She wants to talk to [facility].  Will call me back with decision. Tct-niki at [facility]. Per the wife patient haS MEDICARE A AND B AS THE PRIMARY INSURANCE. Tct-Niki at [employer], information resubmitted request to reconsider sent. Tct-niki reconsideration is in progress.   Tcf-niki/question about vaccination against covid/per the wife he has not been vaccinated nor does he wish to be, niki will caLL BACK. Expected Discharge Plan: Home/Self Care Barriers to Discharge: Continued Medical Work up  Expected Discharge Plan and Services Expected Discharge Plan: Home/Self Care   Discharge Planning Services: CM Consult   Living arrangements for the past 2 months: Single Family Home                                       Social Determinants of Health (SDOH) Interventions    Readmission Risk Interventions No flowsheet data found.

## 2021-07-03 NOTE — Progress Notes (Signed)
Occupational Therapy Treatment Patient Details Name: Daniel King MRN: [id number] DOB: 1946/03/26 Today's Date: 07/03/2021    History of present illness Patient is a 75 year old male who presented on 06/24/2021 with two day history of abdominal pain and discomfort. Patient's CT was concerning for preforation. 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 Patient was also found to have septic shock, acute respiratory failure and blood loss anemia. Patient was extubated on 8/22.   PMH: COPD, A fib,h/o CVA   OT comments  Patient alert upon arrival, does need increased time and multimodal cuing to initiate bed mobility with mod A x1-2 for trunk support from side lying to sitting. Patient decompensates quickly leaning onto L forearm this session needing external support and heavy mod x2 to power up to standing from elevated bed height. Utilized sara stedy to transfer to recliner chair. Once transferred patient reports difficulty breathing however VSS and becomes more lethargic. Notified RN however with increased stimuli and encouragement from spouse to keep eyes open patient again becomes more alert.    Follow Up Recommendations  SNF    Equipment Recommendations  None recommended by OT       Precautions / Restrictions Precautions Precautions: Fall Precaution Comments: ileostomy, monitor BP and HR       Mobility Bed Mobility Overal bed mobility: Needs Assistance Bed Mobility: Rolling;Sidelying to Sit Rolling: Mod assist Sidelying to sit: Mod assist;+2 for physical assistance       General bed mobility comments: patient does initiate getting to side of bed with lower extremities needing increased assistance for trunk support to sitting. while edge of bed patient fatigues quickly and leans to L side    Transfers Overall transfer level: Needs assistance   Transfers: Sit to/from Stand;Stand Pivot Transfers Sit to Stand: Mod  assist;+2 physical assistance;+2 safety/equipment;From elevated surface Stand pivot transfers: Total assist;+2 physical assistance;+2 safety/equipment       General transfer comment: heavy mod x2 to power up to standing from edge of bed, utilize sara stedy to transfer to recliner chair as patient is significantly weak and fatigues quickly    Balance Overall balance assessment: Needs assistance Sitting-balance support: Feet supported;Single extremity supported;Bilateral upper extremity supported Sitting balance-Leahy Scale: Poor Sitting balance - Comments: fatigues quickly leaning onto L forearm   Standing balance support: Bilateral upper extremity supported Standing balance-Leahy Scale: Zero Standing balance comment: reliant on mod x2 to stand then use of stedy                           ADL either performed or assessed with clinical judgement   ADL Overall ADL's : Needs assistance/impaired Eating/Feeding: Total assistance;Sitting Eating/Feeding Details (indicate cue type and reason): attempt to have patient utilize utensil to eat breakfast but cannot hold onto fork. spouse reports she has been feeding patient since being in the hospital                     Toilet Transfer: Total assistance;+2 for physical assistance;+2 for safety/equipment Toilet Transfer Details (indicate cue type and reason): patient needing heavy mod x2 to power up to standing, then utilize stedy to transfer to recliner chair. global weakness with limiting activity tolerance         Functional mobility during ADLs: Total assistance;+2 for physical assistance;+2 for safety/equipment (stedy)        Cognition Arousal/Alertness: Awake/alert;Lethargic Behavior During Therapy: Flat affect Overall Cognitive  Status: Impaired/Different from baseline Area of Impairment: Following commands;Problem solving                       Following Commands: Follows one step commands with increased  time     Problem Solving: Slow processing;Decreased initiation;Requires verbal cues;Requires tactile cues General Comments: patient fatigues after transfer to recliner chair and becomes more lethargic. with increased stimuli and spouse encouragement patient becomes more interactive and keeping eyes open.              General Comments VSS on 3L O2    Pertinent Vitals/ Pain       Pain Assessment: Faces Faces Pain Scale: No hurt         Frequency  Min 2X/week        Progress Toward Goals  OT Goals(current goals can now be found in the care plan section)  Progress towards OT goals: Not progressing toward goals - comment (global weakness, limited activity tolerance, fluctuating medical status/BP)  Acute Rehab OT Goals Patient Stated Goal: home OT Goal Formulation: With patient/family Time For Goal Achievement: 07/11/21 Potential to Achieve Goals: Fair ADL Goals Pt Will Perform Grooming: with supervision;with set-up;sitting Pt Will Transfer to Toilet: with min assist;stand pivot transfer;bedside commode Pt/caregiver will Perform Home Exercise Program: Both right and left upper extremity;With theraband;With written HEP provided;With Supervision Additional ADL Goal #1: Patient will perform 10 min functional activity or exercise activity as evidence of improving activity tolerance  Plan Discharge plan remains appropriate    Co-evaluation    PT/OT/SLP Co-Evaluation/Treatment: Yes Reason for Co-Treatment: To address functional/ADL transfers;For patient/therapist safety;Complexity of the patient's impairments (multi-system involvement) PT goals addressed during session: Mobility/safety with mobility OT goals addressed during session: ADL's and self-care      AM-PAC OT "6 Clicks" Daily Activity     Outcome Measure   Help from another person eating meals?: Total Help from another person taking care of personal grooming?: A Lot Help from another person toileting, which  includes using toliet, bedpan, or urinal?: Total Help from another person bathing (including washing, rinsing, drying)?: A Lot Help from another person to put on and taking off regular upper body clothing?: A Lot Help from another person to put on and taking off regular lower body clothing?: Total 6 Click Score: 9    End of Session Equipment Utilized During Treatment: Oxygen;Gait belt  OT Visit Diagnosis: Other abnormalities of gait and mobility (R26.89);Unsteadiness on feet (R26.81);Muscle weakness (generalized) (M62.81)   Activity Tolerance Patient limited by fatigue   Patient Left in chair;with call bell/phone within reach;with chair alarm set;with family/visitor present   Nurse Communication Mobility status;Need for lift equipment (placed hoyer pad in chair)        Time: [id number] OT Time Calculation (min): 24 min  Charges: OT General Charges $OT Visit: 1 Visit OT Treatments $Self Care/Home Management : 8-22 mins  Delbert Phenix OT OT pager: [place] 07/03/2021, 10:03 AM

## 2021-07-03 NOTE — Progress Notes (Signed)
Physical Therapy Treatment Patient Details Name: Daniel King MRN: [id number] DOB: [birth year] Today's Date: 07/03/2021    History of Present Illness Patient is a 75 year old male who presented on 06/06/2021 with two day history of abdominal pain and discomfort. Patient's CT was concerning for preforation. 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 Patient was also found to have septic shock, acute respiratory failure and blood loss anemia. Patient was extubated on 8/22.   PMH: COPD, A fib,h/o CVA    PT Comments    Initially patient very alert,combing his hair and  talking to wife at bedside.  Patient requiring mod /max to total assistance to roll and sit up on bed edge. Patient noted to be very weak. Able to stand with max /total assist to power up to stand in STEDY, sat on flaps, stood again after rolled to recliner. Patient noted to be less responsive for ~ 5 minutes after sitting.in recliner. SPO2 on 4 L 92%, HR in 90's. RN alerted. Patient  returned to more aroused state and able to be assisted to eat breakfast.  Continue  mobility as tolerated. Patient very deconditioned.  Follow Up Recommendations  SNF     Equipment Recommendations  None recommended by PT    Recommendations for Other Services       Precautions / Restrictions Precautions Precautions: Fall Precaution Comments: ileostomy, monitor BP and HR, abdominal wound    Mobility  Bed Mobility Overal bed mobility: Needs Assistance Bed Mobility: Rolling;Sidelying to Sit Rolling: Mod assist Sidelying to sit: Mod assist;+2 for physical assistance       General bed mobility comments: patient does initiate getting to side of bed with lower extremities needing increased assistance for trunk support to sitting. while edge of bed patient fatigues quickly and leans to L side    Transfers Overall transfer level: Needs assistance   Transfers: Sit to/from  Stand;Stand Pivot Transfers Sit to Stand: Mod assist;+2 physical assistance;+2 safety/equipment;From elevated surface Stand pivot transfers: Total assist;+2 physical assistance;+2 safety/equipment       General transfer comment: heavy mod x2 to power up to standing from edge of bed, utilize sara stedy to transfer to recliner chair as patient is significantly weak and fatigues quickly  Ambulation/Gait                 Stairs             Wheelchair Mobility    Modified Rankin (Stroke Patients Only)       Balance Overall balance assessment: Needs assistance Sitting-balance support: Feet supported;Single extremity supported;Bilateral upper extremity supported Sitting balance-Leahy Scale: Poor Sitting balance - Comments: fatigues quickly leaning onto L forearm Postural control: Right lateral lean Standing balance support: Bilateral upper extremity supported Standing balance-Leahy Scale: Zero Standing balance comment: reliant on mod x2 to stand then use of stedy, leans forward over bard, cues to stand more eret                            Cognition Arousal/Alertness: Awake/alert;Lethargic Behavior During Therapy: Flat affect Overall Cognitive Status: Impaired/Different from baseline Area of Impairment: Following commands;Problem solving                       Following Commands: Follows one step commands with increased time     Problem Solving: Slow processing;Decreased initiation;Requires verbal cues;Requires tactile cues General Comments: patient fatigues after transfer  to recliner chair and becomes more lethargic. with increased stimuli and spouse encouragement patient becomes more interactive and keeping eyes open.      Exercises      General Comments General comments (skin integrity, edema, etc.): VSS on 3L O2      Pertinent Vitals/Pain Pain Assessment: Faces Faces Pain Scale: No hurt    Home Living                       Prior Function            PT Goals (current goals can now be found in the care plan section) Acute Rehab PT Goals Patient Stated Goal: home Progress towards PT goals: Progressing toward goals    Frequency    Min 2X/week      PT Plan Current plan remains appropriate    Co-evaluation PT/OT/SLP Co-Evaluation/Treatment: Yes Reason for Co-Treatment: Complexity of the patient's impairments (multi-system involvement);For patient/therapist safety PT goals addressed during session: Mobility/safety with mobility OT goals addressed during session: ADL's and self-care      AM-PAC PT "6 Clicks" Mobility   Outcome Measure  Help needed turning from your back to your side while in a flat bed without using bedrails?: Total Help needed moving from lying on your back to sitting on the side of a flat bed without using bedrails?: Total Help needed moving to and from a bed to a chair (including a wheelchair)?: Total Help needed standing up from a chair using your arms (e.g., wheelchair or bedside chair)?: Total Help needed to walk in hospital room?: Total Help needed climbing 3-5 steps with a railing? : Total 6 Click Score: 6    End of Session Equipment Utilized During Treatment: Gait belt Activity Tolerance: Patient limited by fatigue Patient left: with chair alarm set;in chair;with family/visitor present;with call bell/phone within reach Nurse Communication: Mobility status;Need for lift equipment       Time: [id number] PT Time Calculation (min) (ACUTE ONLY): 24 min  Charges:  $Therapeutic Activity: 8-22 mins                     Tresa Endo PT Acute Rehabilitation Services Pager [phone] Office [phone]    Claretha Cooper 07/03/2021, 10:13 AM

## 2021-07-04 ENCOUNTER — Other Ambulatory Visit: Payer: Self-pay

## 2021-07-04 ENCOUNTER — Inpatient Hospital Stay (HOSPITAL_COMMUNITY): Payer: No Typology Code available for payment source

## 2021-07-04 DIAGNOSIS — C19 Malignant neoplasm of rectosigmoid junction: Secondary | ICD-10-CM | POA: Diagnosis not present

## 2021-07-04 LAB — URINALYSIS, ROUTINE W REFLEX MICROSCOPIC
Bilirubin Urine: NEGATIVE
Glucose, UA: NEGATIVE mg/dL
Ketones, ur: NEGATIVE mg/dL
Leukocytes,Ua: NEGATIVE
Nitrite: NEGATIVE
Protein, ur: NEGATIVE mg/dL
RBC / HPF: >50 RBC/hpf — ABNORMAL HIGH (ref 0–5)
Specific Gravity, Urine: 1.02 (ref 1.005–1.030)
pH: 6 (ref 5.0–8.0)

## 2021-07-04 LAB — CBC WITH DIFFERENTIAL/PLATELET
Abs Immature Granulocytes: 0.25 10*3/uL — ABNORMAL HIGH (ref 0.00–0.07)
Basophils Absolute: 0 10*3/uL (ref 0.0–0.1)
Basophils Relative: 0 %
Eosinophils Absolute: 0.2 10*3/uL (ref 0.0–0.5)
Eosinophils Relative: 2 %
HCT: 28.4 % — ABNORMAL LOW (ref 39.0–52.0)
Hemoglobin: 8.8 g/dL — ABNORMAL LOW (ref 13.0–17.0)
Immature Granulocytes: 2 %
Lymphocytes Relative: 4 %
Lymphs Abs: 0.6 10*3/uL — ABNORMAL LOW (ref 0.7–4.0)
MCH: 27.8 pg (ref 26.0–34.0)
MCHC: 31 g/dL (ref 30.0–36.0)
MCV: 89.9 fL (ref 80.0–100.0)
Monocytes Absolute: 1 10*3/uL (ref 0.1–1.0)
Monocytes Relative: 7 %
Neutro Abs: 12.5 10*3/uL — ABNORMAL HIGH (ref 1.7–7.7)
Neutrophils Relative %: 85 %
Platelets: 392 10*3/uL (ref 150–400)
RBC: 3.16 MIL/uL — ABNORMAL LOW (ref 4.22–5.81)
RDW: 18.9 % — ABNORMAL HIGH (ref 11.5–15.5)
WBC: 14.6 10*3/uL — ABNORMAL HIGH (ref 4.0–10.5)
nRBC: 0 % (ref 0.0–0.2)

## 2021-07-04 LAB — GLUCOSE, CAPILLARY
Glucose-Capillary: 85 mg/dL (ref 70–99)
Glucose-Capillary: 85 mg/dL (ref 70–99)
Glucose-Capillary: 94 mg/dL (ref 70–99)
Glucose-Capillary: 107 mg/dL — ABNORMAL HIGH (ref 70–99)
Glucose-Capillary: 137 mg/dL — ABNORMAL HIGH (ref 70–99)
Glucose-Capillary: 127 mg/dL — ABNORMAL HIGH (ref 70–99)
Glucose-Capillary: 102 mg/dL — ABNORMAL HIGH (ref 70–99)

## 2021-07-04 LAB — COMPREHENSIVE METABOLIC PANEL
ALT: 16 U/L (ref 0–44)
AST: 12 U/L — ABNORMAL LOW (ref 15–41)
Albumin: 2.6 g/dL — ABNORMAL LOW (ref 3.5–5.0)
Alkaline Phosphatase: 69 U/L (ref 38–126)
Anion gap: 9 (ref 5–15)
BUN: 14 mg/dL (ref 8–23)
CO2: 32 mmol/L (ref 22–32)
Calcium: 8.9 mg/dL (ref 8.9–10.3)
Chloride: 98 mmol/L (ref 98–111)
Creatinine, Ser: 0.76 mg/dL (ref 0.61–1.24)
GFR, Estimated: >60 mL/min (ref >60)
Glucose, Bld: 91 mg/dL (ref 70–99)
Potassium: 4.3 mmol/L (ref 3.5–5.1)
Sodium: 139 mmol/L (ref 135–145)
Total Bilirubin: 0.8 mg/dL (ref 0.3–1.2)
Total Protein: 5.4 g/dL — ABNORMAL LOW (ref 6.5–8.1)

## 2021-07-04 LAB — PHOSPHORUS: Phosphorus: 3.7 mg/dL (ref 2.5–4.6)

## 2021-07-04 LAB — MAGNESIUM: Magnesium: 1.9 mg/dL (ref 1.7–2.4)

## 2021-07-04 MED ORDER — BETHANECHOL CHLORIDE 25 MG PO TABS
25.0000 mg | ORAL_TABLET | Freq: Three times a day (TID) | ORAL | Status: DC
  Administered 2021-07-04: 25 mg via ORAL
  Filled 2021-07-04: qty 1

## 2021-07-04 MED ORDER — ADULT MULTIVITAMIN W/MINERALS CH
1.0000 | ORAL_TABLET | Freq: Every day | ORAL | Status: DC
  Administered 2021-07-04 – 2021-07-07 (×4): 1 via ORAL
  Filled 2021-07-04 (×5): qty 1

## 2021-07-04 MED ORDER — COVID-19 MRNA VACC (MODERNA) 100 MCG/0.5ML IM SUSP
0.5000 mL | Freq: Once | INTRAMUSCULAR | Status: AC
  Administered 2021-07-04: 0.5 mL via INTRAMUSCULAR
  Filled 2021-07-04: qty 0.5

## 2021-07-04 NOTE — Progress Notes (Signed)
Progress Note  12 Days Post-Op  Subjective: Patient observed to have some jerking movements overnight while resting. Neuro workup negative. Leukocytosis noted on labs this AM, afebrile. Rn reported he was in and out catheterized 2x yesterday for urinary retention. Patient reports infrequent urination at baseline but does report some hesitancy the last 1-2 days.   Objective: Vital signs in last 24 hours: Temp:  [97.6 F (36.4 C)-97.8 F (36.6 C)] 97.7 F (36.5 C) (08/31 0800) Pulse Rate:  [73-95] 82 (08/31 0900) Resp:  [18-26] 23 (08/31 0900) BP: (74-176)/(45-142) 130/61 (08/31 0900) SpO2:  [84 %-100 %] 96 % (08/31 0900) FiO2 (%):  [32 %] 32 % (08/31 0833) Weight:  [75.9 kg] 75.9 kg (08/31 0500) Last BM Date: 07/03/21  Intake/Output from previous day: 08/30 0701 - 08/31 0700 In: 348.6 [P.O.:240; I.V.:108.6] Out: 375 [Urine:375] Intake/Output this shift: No intake/output data recorded.  PE: Gen: Alert, cooperative CV: RRR Pulm: unlabored Abd: Soft, nondistended, minimally tender. Midline wound clean without active bleeding; stoma viable but a little prolapsed, soft stool in the bag as well as air Ext: SCDs in place   Lab Results:  Recent Labs    07/03/21 0243 07/04/21 0244  WBC 9.6 14.6*  HGB 9.0* 8.8*  HCT 29.4* 28.4*  PLT 375 392   BMET Recent Labs    07/03/21 0243 07/04/21 0244  NA 138 139  K 4.4 4.3  CL 98 98  CO2 32 32  GLUCOSE 96 91  BUN 12 14  CREATININE 0.75 0.76  CALCIUM 8.9 8.9   PT/INR No results for input(s): LABPROT, INR in the last 72 hours. CMP     Component Value Date/Time   NA 139 07/04/2021 0244   K 4.3 07/04/2021 0244   CL 98 07/04/2021 0244   CO2 32 07/04/2021 0244   GLUCOSE 91 07/04/2021 0244   BUN 14 07/04/2021 0244   CREATININE 0.76 07/04/2021 0244   CALCIUM 8.9 07/04/2021 0244   PROT 5.4 (L) 07/04/2021 0244   ALBUMIN 2.6 (L) 07/04/2021 0244   AST 12 (L) 07/04/2021 0244   ALT 16 07/04/2021 0244   ALKPHOS 69  07/04/2021 0244   BILITOT 0.8 07/04/2021 0244   GFRNONAA >60 07/04/2021 0244   Lipase  No results found for: LIPASE     Studies/Results: MR BRAIN W WO CONTRAST  Result Date: 07/03/2021 CLINICAL DATA:  Colon carcinoma.  Metastatic disease evaluation. EXAM: MRI HEAD WITHOUT AND WITH CONTRAST TECHNIQUE: Multiplanar, multiecho pulse sequences of the brain and surrounding structures were obtained without and with intravenous contrast. CONTRAST:  7.26m GADAVIST GADOBUTROL 1 MMOL/ML IV SOLN COMPARISON:  None. FINDINGS: Brain: No acute infarct, mass effect or extra-axial collection. Foci of chronic microhemorrhage in the cerebellum. No acute hemorrhage. There is multifocal hyperintense T2-weighted signal within the white matter. Generalized volume loss without a clear lobar predilection. There are old centrum semiovale and basal ganglia small vessel infarcts. The midline structures are normal. There is no abnormal contrast enhancement. No mass lesion. No intracranial metastatic disease. Vascular: Major flow voids are preserved. Skull and upper cervical spine: Normal calvarium and skull base. Visualized upper cervical spine and soft tissues are normal. Sinuses/Orbits:Mastoid effusions. Paranasal sinuses are clear. Normal orbits. IMPRESSION: 1. No intracranial metastatic disease. 2. Chronic small vessel disease and volume loss. Electronically Signed   By: KUlyses JarredM.D.   On: 07/03/2021 19:21   EEG adult  Result Date: 07/03/2021 YLora Havens MD     07/03/2021  4:47 PM Patient  Name: Daniel King MRN: [id number] Epilepsy Attending: Lora Havens Referring Physician/Provider: Dr Carlisle Cater Date: 07/03/2021 Duration: 22.10 mins Patient history: 75yo m with sudden jerking movements. EEG to evaluate for seizure Level of alertness: Awake, drowsy AEDs during EEG study: None Technical aspects: This EEG study was done with scalp electrodes positioned according to the 10-20 International system of  electrode placement. Electrical activity was acquired at a sampling rate of '500Hz'$  and reviewed with a high frequency filter of '70Hz'$  and a low frequency filter of '1Hz'$ . EEG data were recorded continuously and digitally stored. Description: The posterior dominant rhythm consists of 7.5 Hz activity of moderate voltage (25-35 uV) seen predominantly in posterior head regions, symmetric and reactive to eye opening and eye closing. Drowsiness was characterized by attenuation of the posterior background rhythm. EEG showed intermittent generalized 3 to 6 Hz theta-delta slowing. Intermittent episodes of right and left extremity brief jerks were noted. Concomitant EEG before, during and after the event did not show any EEG changes suggest seizure. Hyperventilation and photic stimulation were not performed.   ABNORMALITY - Intermittent slow, generalized IMPRESSION: This study is suggestive of mild diffuse encephalopathy, nonspecific etiology. No seizures or epileptiform discharges were seen throughout the recording. Intermittent episodes of right and left extremity brief jerks were noted without concomitant EEG change. These episode were NOT epileptic. Daniel King    Anti-infectives: Anti-infectives (From admission, onward)    Start     Dose/Rate Route Frequency Ordered Stop   06/24/2021 1400  piperacillin-tazobactam (ZOSYN) IVPB 3.375 g        3.375 g 12.5 mL/hr over 240 Minutes Intravenous Every 8 hours 06/19/2021 1357 06/27/21 1016   06/13/2021 0745  cefoTEtan (CEFOTAN) 2 g in sodium chloride 0.9 % 100 mL IVPB        2 g 200 mL/hr over 30 Minutes Intravenous On call to O.R. 07/03/2021 0740 06/19/2021 0831   06/09/2021 0745  sodium chloride 0.9 % with cefoTEtan (CEFOTAN) ADS Med       Note to Pharmacy: Charmayne Sheer   : cabinet override      07/01/2021 0745 06/09/2021 0854   06/15/2021 0700  piperacillin-tazobactam (ZOSYN) IVPB 3.375 g        3.375 g 100 mL/hr over 30 Minutes Intravenous  Once 06/15/2021 0653 07/03/2021  0733        Assessment/Plan POD12 Ex-lap with TAC and end ileostomy Dr. Johney Maine 06/16/2021 - tolerating diet and having ileostomy output  - WOC following, WTD to midline wound BID - PO pain control seems improved - continue to mobilize as tolerated - PT/OT recommending SNF at this time - pathology: T4N1 obstructing rectosigmoid cancer.  Dr. Johney Maine has initiated arrangements for GI tumor board and outpatient oncology follow-up. Patient and family are aware  Urinary retention - patient reports hesitancy and RN reported he was I&O cathed twice yesterday, place foley and check UA and cx, will start urecholine as well  Jerking movements - EEG negative for seizure activity and MRI brain negative for acute process, No further episodes    FEN: reg diet, supplements VTE: SQ heparin, plavix ID: no current abx; WBC 14, VSS - check UA and CXR   Dispo: transfer to tele floor. Place foley for retention. CXR and UA Continue therapies. Stable for discharge from a surgical perspective.    Chronic hypoxic and hypercarbic respiratory failure in setting of advanced COPD and current tobacco abuse - per CCM/TRH, appreciate assistance  Acute metabolic encephalopathy - delirium precautions, some  confusion reported this AM but improving A. Fib with RVR and prolonged QTc HTN ABL anemia on likely anemia of chronic disease GERD Hx of bladder cancer stage IV HLD Hx of CVA Oct 2021 Severe protein calorie malnutrition  Dysphagia - SLP following, reg diet FTT  LOS: 12 days    Norm Parcel, [facility] Surgery 07/04/2021, 10:08 AM Please see Amion for pager number during day hours 7:00am-4:30pm

## 2021-07-04 NOTE — TOC Progression Note (Addendum)
Transition of Care [facility]) - Progression Note    Patient Details  Name: Zakir Reber MRN: [id number] Date of Birth: [birth year]  [facility] of [facility]) CM/SW Contact  Leeroy Cha, RN Phone Number: 07/04/2021, 7:38 AM  Clinical Narrative:    [phone] tcf-niki at [place], patient who is not vaccinated will be in isolation [phone], with vaccine in isolation but one week.  Will discuss with wife and patient who is against being vaccinated. Tct-cindi the wife/will talk to patient about getting vaccinated. Note via secure chat sent to floor rn that patient needs to be [place] 19 Tct-niki at [facility] that patient wants a private room and that he has agreed to be vaccinated.  NO room available today maybe tomorrow. Expected Discharge Plan: Home/Self Care Barriers to Discharge: Continued Medical Work up  Expected Discharge Plan and Services Expected Discharge Plan: Home/Self Care   Discharge Planning Services: CM Consult   Living arrangements for the past 2 months: Single Family Home                                       Social Determinants of Health (SDOH) Interventions    Readmission Risk Interventions No flowsheet data found.

## 2021-07-04 NOTE — Progress Notes (Signed)
The proposed treatment discussed in conference is for discussion purpose only and is not a binding recommendation.  The patients have not been physically examined, or presented with their treatment options.  Therefore, final treatment plans cannot be decided.  

## 2021-07-04 NOTE — Progress Notes (Signed)
Nutrition Follow-up  DOCUMENTATION CODES:  Non-severe (moderate) malnutrition in context of chronic illness  INTERVENTION:  Discontinue Boost Breeze BID.  Add Magic cup TID with meals, each supplement provides 290 kcal and 9 grams of protein.  Add MVI with minerals daily.  NUTRITION DIAGNOSIS:  Moderate Malnutrition related to chronic illness (COPD) as evidenced by mild fat depletion, mild muscle depletion, moderate muscle depletion. - ongoing  GOAL:  Patient will meet greater than or equal to 90% of their needs - progressing  MONITOR:  Diet advancement, Labs, Weight trends, Skin  REASON FOR ASSESSMENT:  Ventilator    ASSESSMENT:  75 year-old male with medical history of COPD. He presented to the ED due to worsening abdominal pain. CT showed diffuse dilated colon with pneumatosis consistent with colon obstruction with ischemic perforation. 8/19 - admission; intubation; NGT placement; ex lap with colectomy and end ileostomy 8/22 - extubation 8/24 - NGT removed, MBSS (dysphagia)  Pt with no recent documented intakes.   Spoke to patient at bedside. Patient ate around 75% of breakfast tray from RD observation. He reports his appetite is a bit better.   He reports disliking the [employer]. Pt willing to try Magic Cup.  Admit wt: 68 kg Current wt: 75.9 kg  RD to discontinue BB BID and add Magic Cup TID to trays, as well as MVI with minerals daily.  Supplements: Boost Breeze BID  Medications: reviewed; folic acid, Protonix, thiamine  Labs: reviewed; CBG 85-123  Diet Order:   Diet Order             Diet regular Room service appropriate? Yes; Fluid consistency: Thin  Diet effective now                  EDUCATION NEEDS:  No education needs have been identified at this time  Skin:  Skin Assessment: Skin Integrity Issues: Skin Integrity Issues:: DTI, Incisions DTI: coccyx (newly documented 8/22) Incisions: abdomen (8/19)  Last BM:  07/03/21 - Type 4 via  ileostomy  Height:  Ht Readings from Last 1 Encounters:  06/30/2021 6' (1.829 m)   Weight:  Wt Readings from Last 1 Encounters:  07/04/21 75.9 kg   Ideal Body Weight:  80.9 kg  BMI:  Body mass index is 22.69 kg/m.  Estimated Nutritional Needs:  Kcal:  1995-2190 kcal Protein:  105-120 grams Fluid:  >/= 2 L/day  Derrel Nip, RD, LDN (she/her/hers) Registered Dietitian I After-Hours/Weekend Pager # in [place]

## 2021-07-04 NOTE — Progress Notes (Signed)
PROGRESS NOTE    Daniel King  [id number] DOB: 12-18-1945 DOA: 06/09/2021 PCP: Henreitta Cea, MD   Brief Narrative:  This 75 years old male who presented with worsening abdominal pain associated with distention. CT abdomen showed findings concerning for primary colon cancer complicated by colonic obstruction with pneumatosis. He had an evidence of bowel perforation with small volume pneumoperitoneum.  He was evaluated by general surgery and underwent exploratory laparotomy with open colectomy and ileostomy.  Patient had intraoperative hypotension requiring pressors and mechanical ventilation.  Patient was transferred to ICU.  He had postoperative confusion and delirium and was placed on Precedex which was subsequently discontinued.  Patient was extubated and off pressers.  Patient has developed sudden jerking movements.  MRI and EEG negative. Patient has improved medically and from surgical standpoint they feel patient can be discharged to SNF. Follow-up with Dr. Morey Hummingbird in outpatient setting.   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 Completed IV Zosyn for 5 days on 06/27/21 Continue dysphagia 3 diet. Pathology reports will be presented at GI tumor board, need outpatient follow-up. Further management as per general surgery.  Chronic hypoxic and hypercarbic respiratory failure in the context of advanced COPD and ongoing tobacco abuse. Per Pulmonary: -Limited pulmonary  reserve, actually on nocturnal CPAP at baseline due to advanced lung disease and pulmonary deconditioning. Continue supplemental oxygen with nocturnal BIPAP and as needed.. Continue with scheduled bronchodilators and inhaled corticosteroids.  Acute metabolic encephalopathy: > Resolved. His delirium has resolved and he is off Precedex drip. He is back to his baseline mental status.   He does have intermittent confusion due to CO2 retention.  Atrial fibrillation: Heart rate controlled, continue metoprolol 12.5 every 12.  Anemia of chronic disease: No signs of any active bleeding.   Hemoglobin remained stable.  Sudden jerking and tremors: > Improved. MRI unremarkable.  EEG no evidence of seizures.  GERD : Continue pantoprazole.  History of prostate cancer : Stable / in remission.   DVT prophylaxis: Heparin sq Code Status: Full code Family Communication:  No family at bed side. Disposition Plan:   Status is: Inpatient  Remains inpatient appropriate because:Inpatient level of care appropriate due to severity of illness  Dispo: The patient is from: Home              Anticipated d/c is to: SNF              Patient currently is not medically stable to d/c.   Difficult to place patient No   Consultants:  General surgery PCCM Neurology  Procedures: Exploratory laparotomy, abdominal colectomy with end ileostomy on 06/13/2021  antimicrobials:   Anti-infectives (From admission, onward)    Start     Dose/Rate Route Frequency Ordered Stop   07/04/2021 1400  piperacillin-tazobactam (ZOSYN) IVPB 3.375 g        3.375 g 12.5 mL/hr over 240 Minutes Intravenous Every 8 hours 07/04/2021 1357 06/27/21 1016   06/11/2021 0745  cefoTEtan (CEFOTAN) 2 g in sodium chloride 0.9 % 100 mL IVPB  2 g 200 mL/hr over 30 Minutes Intravenous On call to O.R. 06/16/2021 0740 07/02/2021 0831   06/06/2021 0745  sodium chloride 0.9 % with cefoTEtan (CEFOTAN) ADS Med       Note to Pharmacy: Charmayne Sheer   :  cabinet override      07/01/2021 0745 06/26/2021 0854   06/04/2021 0700  piperacillin-tazobactam (ZOSYN) IVPB 3.375 g        3.375 g 100 mL/hr over 30 Minutes Intravenous  Once 06/17/2021 0653 06/29/2021 0733        Subjective: Patient was seen and examined at bedside.  Overnight events noted.  Patient is lying comfortably on the bed,  reports having mild abdominal pain.  He report able to pass flatus but has not had urinated since yesterday.  Objective: Vitals:   07/04/21 1105 07/04/21 1123 07/04/21 1200 07/04/21 1209  BP: (!) 141/52  (!) 130/52 (!) 130/52  Pulse: 85  91   Resp: (!) 22  (!) 24   Temp:   (!) 96.9 F (36.1 C)   TempSrc:   Axillary   SpO2: 94% 96% 96%   Weight:      Height:        Intake/Output Summary (Last 24 hours) at 07/04/2021 1316 Last data filed at 07/04/2021 1218 Gross per 24 hour  Intake 588.61 ml  Output 375 ml  Net 213.61 ml   Filed Weights   07/02/21 0500 07/03/21 0500 07/04/21 0500  Weight: 77.5 kg 72.5 kg 75.9 kg    Examination:  General exam: Appears chronically ill looking, not in any acute distress. Respiratory system: Clear to auscultation bilaterally, respiratory effort normal. Cardiovascular system: S1-S2 heard, regular rate and rhythm, no murmur.   Gastrointestinal system: Abdomen is soft, mildly tender, nondistended, colostomy bag noted on the right side, midline surgical scar noted with dressing.  Bowel sounds + Central nervous system: Alert and oriented x 3 . No focal neurological deficits. Extremities: No edema, no cyanosis, no clubbing. Skin: No rashes, lesions or ulcers Psychiatry: Judgement and insight appear normal. Mood & affect appropriate.     Data Reviewed: I have personally reviewed following labs and imaging studies  CBC: Recent Labs  Lab 06/30/21 0303 07/01/21 0257 07/02/21 0301 07/03/21 0243 07/04/21 0244  WBC 11.8* 10.3 8.7 9.6 14.6*  NEUTROABS 9.0* 7.7 6.4 7.1 12.5*  HGB 9.0* 8.5* 8.9* 9.0* 8.8*  HCT 29.3* 28.3*  29.5* 29.4* 28.4*  MCV 89.3 91.3 91.9 91.0 89.9  PLT 389 373 377 375 0000000   Basic Metabolic Panel: Recent Labs  Lab 06/30/21 0303 07/01/21 0257 07/02/21 0301 07/02/21 1907 07/03/21 0243 07/04/21 0244  NA 136 140 138 140 138 139  K 4.1 4.0 4.2 4.4 4.4 4.3  CL 97* 98 97* 99 98 98  CO2 33* 36* 35* 33* 32 32  GLUCOSE 86 98 93 122* 96 91  BUN '11 10 10 11 12 14  '$ CREATININE 0.61 0.64 0.65 0.68 0.75 0.76  CALCIUM 8.4* 8.6* 8.9 9.1 8.9 8.9  MG 1.9 1.8 1.6*  --  2.0 1.9  PHOS 2.9 3.2 3.7  --  3.8 3.7   GFR: Estimated Creatinine Clearance: 85.7 mL/min (by C-G formula based on SCr of 0.76 mg/dL). Liver Function Tests: Recent Labs  Lab 06/30/21 0303 07/01/21 0257 07/02/21 0301 07/03/21 0243 07/04/21 0244  AST 13* 13* 11* 11* 12*  ALT 35 '27 21 18 16  '$ ALKPHOS 64 63 62 60 69  BILITOT 0.9 0.8 0.9 0.8 0.8  PROT 5.2* 5.3*  5.3* 5.4* 5.4*  ALBUMIN 2.4* 2.4* 2.5* 2.5* 2.6*   No results for input(s): LIPASE, AMYLASE in the last 168 hours. Recent Labs  Lab 07/03/21 1254  AMMONIA 25   Coagulation Profile: No results for input(s): INR, PROTIME in the last 168 hours. Cardiac Enzymes: No results for input(s): CKTOTAL, CKMB, CKMBINDEX, TROPONINI in the last 168 hours. BNP (last 3 results) No results for input(s): PROBNP in the last 8760 hours. HbA1C: No results for input(s): HGBA1C in the last 72 hours. CBG: Recent Labs  Lab 07/03/21 1923 07/04/21 0036 07/04/21 0317 07/04/21 0737 07/04/21 1105  GLUCAP 113* 85 85 94 107*   Lipid Profile: No results for input(s): CHOL, HDL, LDLCALC, TRIG, CHOLHDL, LDLDIRECT in the last 72 hours. Thyroid Function Tests: No results for input(s): TSH, T4TOTAL, FREET4, T3FREE, THYROIDAB in the last 72 hours. Anemia Panel: Recent Labs    07/02/21 0301  VITAMINB12 767  FOLATE 8.5  FERRITIN 86  TIBC 335  IRON 40*  RETICCTPCT 3.4*   Sepsis Labs: No results for input(s): PROCALCITON, LATICACIDVEN in the last 168 hours.  No results found for  this or any previous visit (from the past 240 hour(s)).       Radiology Studies: MR BRAIN W WO CONTRAST  Result Date: 07/03/2021 CLINICAL DATA:  Colon carcinoma.  Metastatic disease evaluation. EXAM: MRI HEAD WITHOUT AND WITH CONTRAST TECHNIQUE: Multiplanar, multiecho pulse sequences of the brain and surrounding structures were obtained without and with intravenous contrast. CONTRAST:  7.52m GADAVIST GADOBUTROL 1 MMOL/ML IV SOLN COMPARISON:  None. FINDINGS: Brain: No acute infarct, mass effect or extra-axial collection. Foci of chronic microhemorrhage in the cerebellum. No acute hemorrhage. There is multifocal hyperintense T2-weighted signal within the white matter. Generalized volume loss without a clear lobar predilection. There are old centrum semiovale and basal ganglia small vessel infarcts. The midline structures are normal. There is no abnormal contrast enhancement. No mass lesion. No intracranial metastatic disease. Vascular: Major flow voids are preserved. Skull and upper cervical spine: Normal calvarium and skull base. Visualized upper cervical spine and soft tissues are normal. Sinuses/Orbits:Mastoid effusions. Paranasal sinuses are clear. Normal orbits. IMPRESSION: 1. No intracranial metastatic disease. 2. Chronic small vessel disease and volume loss. Electronically Signed   By: KUlyses JarredM.D.   On: 07/03/2021 19:21   DG CHEST PORT 1 VIEW  Result Date: 07/04/2021 CLINICAL DATA:  History of bowel perforation status post resection, septic shock, respiratory failure, extubated 06/25/2021 EXAM: PORTABLE CHEST 1 VIEW COMPARISON:  Chest radiograph 06/26/2021 FINDINGS: The cardiomediastinal silhouette is normal. Aeration of the lung bases has improved since 06/26/2021, with residual hazy opacity projecting over the left base with silhouetting of the medial left hemidiaphragm. The upper lungs are well aerated. There is no significant pleural effusion. There is no pneumothorax. The bones are  unremarkable. IMPRESSION: Improved aeration of the lung bases since 06/26/2021 with residual left basilar opacities likely reflecting atelectasis. Electronically Signed   By: PValetta MoleM.D.   On: 07/04/2021 11:16   EEG adult  Result Date: 07/03/2021 YLora Havens MD     07/03/2021  4:47 PM Patient Name: JTabitha BallengeeMRN: [id number] Attending: PLora HavensReferring Physician/Provider: Dr OCarlisle CaterDate: 07/03/2021 Duration: 22.10 mins Patient history: 89yom with sudden jerking movements. EEG to evaluate for seizure Level of alertness: Awake, drowsy AEDs during EEG study: None Technical aspects: This EEG study was done with scalp electrodes positioned according to the 10-20 International system of electrode placement. [employer]  activity was acquired at a sampling rate of '500Hz'$  and reviewed with a high frequency filter of '70Hz'$  and a low frequency filter of '1Hz'$ . EEG data were recorded continuously and digitally stored. Description: The posterior dominant rhythm consists of 7.5 Hz activity of moderate voltage (25-35 uV) seen predominantly in posterior head regions, symmetric and reactive to eye opening and eye closing. Drowsiness was characterized by attenuation of the posterior background rhythm. EEG showed intermittent generalized 3 to 6 Hz theta-delta slowing. Intermittent episodes of right and left extremity brief jerks were noted. Concomitant EEG before, during and after the event did not show any EEG changes suggest seizure. Hyperventilation and photic stimulation were not performed.   ABNORMALITY - Intermittent slow, generalized IMPRESSION: This study is suggestive of mild diffuse encephalopathy, nonspecific etiology. No seizures or epileptiform discharges were seen throughout the recording. Intermittent episodes of right and left extremity brief jerks were noted without concomitant EEG change. These episode were NOT epileptic. [name]    Scheduled Meds:   acetaminophen  650 mg Oral Q6H   amLODipine  10 mg Oral Daily   aspirin EC  81 mg Oral Daily   budesonide (PULMICORT) nebulizer solution  0.5 mg Nebulization BID   Chlorhexidine Gluconate Cloth  6 each Topical Daily   clopidogrel  75 mg Oral Daily   folic acid  1 mg Oral Daily   heparin injection (subcutaneous)  5,000 Units Subcutaneous Q8H   ibuprofen  400 mg Oral TID   ipratropium-albuterol  3 mL Nebulization Q4H WA   iron polysaccharides  150 mg Oral Daily   lip balm  1 application Topical BID   mouth rinse  15 mL Mouth Rinse BID   methocarbamol  500 mg Oral TID   metoprolol tartrate  12.5 mg Oral BID   multivitamin with minerals  1 tablet Oral Daily   pantoprazole  40 mg Oral Daily   thiamine  100 mg Oral Daily   Continuous Infusions:  sodium chloride Stopped (07/02/21 2203)     LOS: 12 days    Time spent: 35 mins.    Shawna Clamp, MD [facility]   If 7PM-7AM, please contact night-coverage

## 2021-07-05 ENCOUNTER — Inpatient Hospital Stay (HOSPITAL_COMMUNITY): Payer: No Typology Code available for payment source

## 2021-07-05 LAB — URINE CULTURE: Culture: NO GROWTH

## 2021-07-05 LAB — BASIC METABOLIC PANEL
Anion gap: 7 (ref 5–15)
BUN: 14 mg/dL (ref 8–23)
CO2: 34 mmol/L — ABNORMAL HIGH (ref 22–32)
Calcium: 9.1 mg/dL (ref 8.9–10.3)
Chloride: 98 mmol/L (ref 98–111)
Creatinine, Ser: 0.76 mg/dL (ref 0.61–1.24)
GFR, Estimated: >60 mL/min (ref >60)
Glucose, Bld: 90 mg/dL (ref 70–99)
Potassium: 4 mmol/L (ref 3.5–5.1)
Sodium: 139 mmol/L (ref 135–145)

## 2021-07-05 LAB — GLUCOSE, CAPILLARY
Glucose-Capillary: 85 mg/dL (ref 70–99)
Glucose-Capillary: 72 mg/dL (ref 70–99)
Glucose-Capillary: 77 mg/dL (ref 70–99)
Glucose-Capillary: 85 mg/dL (ref 70–99)
Glucose-Capillary: 75 mg/dL (ref 70–99)
Glucose-Capillary: 85 mg/dL (ref 70–99)

## 2021-07-05 LAB — CBC
HCT: 28.3 % — ABNORMAL LOW (ref 39.0–52.0)
Hemoglobin: 8.7 g/dL — ABNORMAL LOW (ref 13.0–17.0)
MCH: 28.1 pg (ref 26.0–34.0)
MCHC: 30.7 g/dL (ref 30.0–36.0)
MCV: 91.3 fL (ref 80.0–100.0)
Platelets: 390 10*3/uL (ref 150–400)
RBC: 3.1 MIL/uL — ABNORMAL LOW (ref 4.22–5.81)
RDW: 19.9 % — ABNORMAL HIGH (ref 11.5–15.5)
WBC: 16.5 10*3/uL — ABNORMAL HIGH (ref 4.0–10.5)
nRBC: 0 % (ref 0.0–0.2)

## 2021-07-05 MED ORDER — IPRATROPIUM-ALBUTEROL 0.5-2.5 (3) MG/3ML IN SOLN
3.0000 mL | Freq: Two times a day (BID) | RESPIRATORY_TRACT | Status: DC
  Administered 2021-07-05 – 2021-07-10 (×9): 3 mL via RESPIRATORY_TRACT
  Filled 2021-07-05 (×10): qty 3

## 2021-07-05 MED ORDER — IOHEXOL 350 MG/ML SOLN
100.0000 mL | Freq: Once | INTRAVENOUS | Status: AC | PRN
  Administered 2021-07-05: 80 mL via INTRAVENOUS

## 2021-07-05 MED ORDER — IOHEXOL 9 MG/ML PO SOLN
ORAL | Status: AC
  Administered 2021-07-05: 500 mL via ORAL
  Filled 2021-07-05: qty 1000

## 2021-07-05 MED ORDER — IOHEXOL 9 MG/ML PO SOLN
500.0000 mL | ORAL | Status: AC
Start: 2021-07-05 — End: 2021-07-05
  Administered 2021-07-05: 500 mL via ORAL

## 2021-07-05 MED ORDER — METOPROLOL TARTRATE 25 MG PO TABS
25.0000 mg | ORAL_TABLET | Freq: Two times a day (BID) | ORAL | Status: DC
  Administered 2021-07-05 – 2021-07-07 (×6): 25 mg via ORAL
  Filled 2021-07-05 (×6): qty 1

## 2021-07-05 NOTE — Progress Notes (Signed)
Progress Note  13 Days Post-Op  Subjective: Patient reports abdomen feels a little funny but denies pain. He is tolerating PO intake and having bowel function. Afebrile. Foley placed yesterday and UOP estimated to be higher than what was recorded for last 24h.   Objective: Vital signs in last 24 hours: Temp:  [96.4 F (35.8 C)-97.7 F (36.5 C)] 97.7 F (36.5 C) (09/01 0733) Pulse Rate:  [76-91] 82 (09/01 0600) Resp:  [17-29] 19 (09/01 0600) BP: (46-180)/(27-163) 180/163 (09/01 0600) SpO2:  [92 %-100 %] 96 % (09/01 0600) FiO2 (%):  [32 %] 32 % (08/31 1623) Weight:  [70.1 kg] 70.1 kg (09/01 0500) Last BM Date: 07/04/21  Intake/Output from previous day: 08/31 0701 - 09/01 0700 In: 720 [P.O.:720] Out: 575 [Urine:450; Stool:125] Intake/Output this shift: Total I/O In: -  Out: 125 [Stool:125]  PE: Gen: Alert, cooperative CV: RRR Pulm: unlabored Abd: Soft, nondistended, minimally tender. Midline wound clean without active bleeding; stoma viable but a little prolapsed, soft stool in the bag as well as air Ext: SCDs in place   Lab Results:  Recent Labs    07/04/21 0244 07/05/21 0306  WBC 14.6* 16.5*  HGB 8.8* 8.7*  HCT 28.4* 28.3*  PLT 392 390   BMET Recent Labs    07/04/21 0244 07/05/21 0306  NA 139 139  K 4.3 4.0  CL 98 98  CO2 32 34*  GLUCOSE 91 90  BUN 14 14  CREATININE 0.76 0.76  CALCIUM 8.9 9.1   PT/INR No results for input(s): LABPROT, INR in the last 72 hours. CMP     Component Value Date/Time   NA 139 07/05/2021 0306   K 4.0 07/05/2021 0306   CL 98 07/05/2021 0306   CO2 34 (H) 07/05/2021 0306   GLUCOSE 90 07/05/2021 0306   BUN 14 07/05/2021 0306   CREATININE 0.76 07/05/2021 0306   CALCIUM 9.1 07/05/2021 0306   PROT 5.4 (L) 07/04/2021 0244   ALBUMIN 2.6 (L) 07/04/2021 0244   AST 12 (L) 07/04/2021 0244   ALT 16 07/04/2021 0244   ALKPHOS 69 07/04/2021 0244   BILITOT 0.8 07/04/2021 0244   GFRNONAA >60 07/05/2021 0306   Lipase  No  results found for: LIPASE     Studies/Results: MR BRAIN W WO CONTRAST  Result Date: 07/03/2021 CLINICAL DATA:  Colon carcinoma.  Metastatic disease evaluation. EXAM: MRI HEAD WITHOUT AND WITH CONTRAST TECHNIQUE: Multiplanar, multiecho pulse sequences of the brain and surrounding structures were obtained without and with intravenous contrast. CONTRAST:  7.69m GADAVIST GADOBUTROL 1 MMOL/ML IV SOLN COMPARISON:  None. FINDINGS: Brain: No acute infarct, mass effect or extra-axial collection. Foci of chronic microhemorrhage in the cerebellum. No acute hemorrhage. There is multifocal hyperintense T2-weighted signal within the white matter. Generalized volume loss without a clear lobar predilection. There are old centrum semiovale and basal ganglia small vessel infarcts. The midline structures are normal. There is no abnormal contrast enhancement. No mass lesion. No intracranial metastatic disease. Vascular: Major flow voids are preserved. Skull and upper cervical spine: Normal calvarium and skull base. Visualized upper cervical spine and soft tissues are normal. Sinuses/Orbits:Mastoid effusions. Paranasal sinuses are clear. Normal orbits. IMPRESSION: 1. No intracranial metastatic disease. 2. Chronic small vessel disease and volume loss. Electronically Signed   By: KUlyses JarredM.D.   On: 07/03/2021 19:21   DG CHEST PORT 1 VIEW  Result Date: 07/04/2021 CLINICAL DATA:  History of bowel perforation status post resection, septic shock, respiratory failure, extubated 06/25/2021 EXAM:  PORTABLE CHEST 1 VIEW COMPARISON:  Chest radiograph 06/26/2021 FINDINGS: The cardiomediastinal silhouette is normal. Aeration of the lung bases has improved since 06/26/2021, with residual hazy opacity projecting over the left base with silhouetting of the medial left hemidiaphragm. The upper lungs are well aerated. There is no significant pleural effusion. There is no pneumothorax. The bones are unremarkable. IMPRESSION: Improved  aeration of the lung bases since 06/26/2021 with residual left basilar opacities likely reflecting atelectasis. Electronically Signed   By: Valetta Mole M.D.   On: 07/04/2021 11:16   EEG adult  Result Date: 07/03/2021 Lora Havens, MD     07/03/2021  4:47 PM Patient Name: Drevan Asbill MRN: [id number] Epilepsy Attending: Lora Havens Referring Physician/Provider: Dr Carlisle Cater Date: 07/03/2021 Duration: 22.10 mins Patient history: 75yo m with sudden jerking movements. EEG to evaluate for seizure Level of alertness: Awake, drowsy AEDs during EEG study: None Technical aspects: This EEG study was done with scalp electrodes positioned according to the 10-20 International system of electrode placement. Electrical activity was acquired at a sampling rate of '500Hz'$  and reviewed with a high frequency filter of '70Hz'$  and a low frequency filter of '1Hz'$ . EEG data were recorded continuously and digitally stored. Description: The posterior dominant rhythm consists of 7.5 Hz activity of moderate voltage (25-35 uV) seen predominantly in posterior head regions, symmetric and reactive to eye opening and eye closing. Drowsiness was characterized by attenuation of the posterior background rhythm. EEG showed intermittent generalized 3 to 6 Hz theta-delta slowing. Intermittent episodes of right and left extremity brief jerks were noted. Concomitant EEG before, during and after the event did not show any EEG changes suggest seizure. Hyperventilation and photic stimulation were not performed.   ABNORMALITY - Intermittent slow, generalized IMPRESSION: This study is suggestive of mild diffuse encephalopathy, nonspecific etiology. No seizures or epileptiform discharges were seen throughout the recording. Intermittent episodes of right and left extremity brief jerks were noted without concomitant EEG change. These episode were NOT epileptic. [name]    Anti-infectives: Anti-infectives (From admission, onward)     Start     Dose/Rate Route Frequency Ordered Stop   06/09/2021 1400  piperacillin-tazobactam (ZOSYN) IVPB 3.375 g        3.375 g 12.5 mL/hr over 240 Minutes Intravenous Every 8 hours 06/11/2021 1357 06/27/21 1016   06/07/2021 0745  cefoTEtan (CEFOTAN) 2 g in sodium chloride 0.9 % 100 mL IVPB        2 g 200 mL/hr over 30 Minutes Intravenous On call to O.R. 06/21/2021 0740 06/16/2021 0831   06/10/2021 0745  sodium chloride 0.9 % with cefoTEtan (CEFOTAN) ADS Med       Note to Pharmacy: Charmayne Sheer   : cabinet override      06/20/2021 0745 06/29/2021 0854   06/23/2021 0700  piperacillin-tazobactam (ZOSYN) IVPB 3.375 g        3.375 g 100 mL/hr over 30 Minutes Intravenous  Once 06/23/2021 0653 06/17/2021 0733        Assessment/Plan POD13 Ex-lap with TAC and end ileostomy Dr. Johney Maine 06/16/2021 - tolerating diet and having ileostomy output  - WOC following, WTD to midline wound BID - PO pain control seems improved - continue to mobilize as tolerated - PT/OT recommending SNF at this time - pathology: T4N1 obstructing rectosigmoid cancer.  Dr. Johney Maine has initiated arrangements for GI tumor board and outpatient oncology follow-up. Patient and family are aware  Urinary retention - foley placed 8/31, continue and await urine cx  results, UA with rare bacteria  Jerking movements - EEG negative for seizure activity and MRI brain negative for acute process, No further episodes    FEN: reg diet, supplements VTE: SQ heparin, plavix ID: no current abx; WBC 16, VSS - CT AP today to rule out post-op complication as source of leukocytosis   Dispo: transfer to tele floor. Continue foley. CT AP today     Chronic hypoxic and hypercarbic respiratory failure in setting of advanced COPD and current tobacco abuse - per CCM/TRH, appreciate assistance  Acute metabolic encephalopathy - delirium precautions, some confusion reported this AM but improving A. Fib with RVR and prolonged QTc HTN ABL anemia on likely anemia of chronic  disease GERD Hx of bladder cancer stage IV HLD Hx of CVA Oct 2021 Severe protein calorie malnutrition  Dysphagia - SLP following, reg diet FTT  LOS: 13 days    Norm Parcel, [facility] Surgery 07/05/2021, 8:17 AM Please see Amion for pager number during day hours 7:00am-4:30pm

## 2021-07-05 NOTE — Progress Notes (Signed)
Physical Therapy Treatment Patient Details Name: Jaris Fairbairn MRN: [id number] DOB: 1946-03-20 Today's Date: 07/05/2021    History of Present Illness Patient is a 75 year old male who presented on 07/03/2021 with two day history of abdominal pain and discomfort. Patient's CT was concerning for preforation. 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 Patient was also found to have septic shock, acute respiratory failure and blood loss anemia. Patient was extubated on 8/22.   PMH: COPD, A fib,h/o CVA    PT Comments    Pt seen in step down ICU.  Spouse Cyndi present.  Pt AxO 3 very pleasant with MAX c/o weakness.  Assisted OOB to recliner required increased time and assist.  General transfer comment: pt was too weak to attempt sit to stand with walker.  Assisted from elevated bed to recloner 1/4 pivot via "[place]". Positioned in recliner to comfort.    Follow Up Recommendations  SNF     Equipment Recommendations  None recommended by PT    Recommendations for Other Services       Precautions / Restrictions Precautions Precautions: Fall Precaution Comments: ileostomy, monitor BP and HR, abdominal wound Restrictions Weight Bearing Restrictions: No    Mobility  Bed Mobility Overal bed mobility: Needs Assistance Bed Mobility: Supine to Sit     Supine to sit: Max assist;Total assist     General bed mobility comments: required increased time and use of bed pad to complete full supine to EOB seated.  Very weak with limited upright time/ability.  Fatigues quickly.    Transfers Overall transfer level: Needs assistance Equipment used: None Transfers: Sit to/from [facility] Sit to Stand: Max assist Stand pivot transfers: Max assist;Total assist       General transfer comment: pt was too weak to attempt sit to stand with walker.  Assisted from elevated bed to recloner 1/4 pivot via "[place]".  Ambulation/Gait             General Gait Details: Too weak to attempt   Stairs             Wheelchair Mobility    Modified Rankin (Stroke Patients Only)       Balance                                            Cognition Arousal/Alertness: Awake/alert;Lethargic Behavior During Therapy: WFL for tasks assessed/performed Overall Cognitive Status: Within Functional Limits for tasks assessed                                 General Comments: AxO x 3 pleasant following all cammands and responding to all questions with increased time.      Exercises      General Comments        Pertinent Vitals/Pain Pain Assessment: Faces Faces Pain Scale: Hurts even more Pain Location: lower abdomen/recent surgery Pain Descriptors / Indicators: Grimacing;Discomfort Pain Intervention(s): Monitored during session;Premedicated before session;Repositioned    Home Living                      Prior Function            PT Goals (current goals can now be found in the care plan section) Progress towards PT  goals: Progressing toward goals    Frequency    Min 2X/week      PT Plan Current plan remains appropriate    Co-evaluation              AM-PAC PT "6 Clicks" Mobility   Outcome Measure  Help needed turning from your back to your side while in a flat bed without using bedrails?: A Lot Help needed moving from lying on your back to sitting on the side of a flat bed without using bedrails?: A Lot Help needed moving to and from a bed to a chair (including a wheelchair)?: A Lot Help needed standing up from a chair using your arms (e.g., wheelchair or bedside chair)?: Total Help needed to walk in hospital room?: Total Help needed climbing 3-5 steps with a railing? : Total 6 Click Score: 9    End of Session Equipment Utilized During Treatment: Gait belt Activity Tolerance: Patient limited by fatigue Patient left:  with chair alarm set;in chair;with family/visitor present;with call bell/phone within reach Nurse Communication: Mobility status PT Visit Diagnosis: Other abnormalities of gait and mobility (R26.89);Muscle weakness (generalized) (M62.81)     Time: 1352-1410 PT Time Calculation (min) (ACUTE ONLY): 18 min  Charges:  $Therapeutic Activity: 8-22 mins                    Rica Koyanagi  PTA Acute  Rehabilitation Services Pager      [phone] Office      [phone]

## 2021-07-05 NOTE — Progress Notes (Signed)
Pt's wife expressed pt seems more confused. Pt "grabbing things in the air". Pt placed on bipap machine. Pt tolerated machine for 5 mins. Pt removed mask off. Pt educated on reasons why to keep mask on. Pt refused bpap. This nurse will continue to monitor.

## 2021-07-05 NOTE — Consult Note (Signed)
[place] Nurse ostomy follow up Patient receiving care in [facility] ICU 1232. Patient is awaiting placement in a SNF. Spouse has seen and participated in ostomy care.  Patient is too ill to perform ostomy care.  I am signing the [place] team off for further ostomy care/teaching.  Please re-consult if needed. Patient was enrolled in [facility] program earlier in this hospital stay. [place] nurse will not follow at this time.  Please re-consult the [place] team if needed.  Val Riles, RN, MSN, CWOCN, CNS-BC, pager [phone]

## 2021-07-05 NOTE — Progress Notes (Addendum)
PROGRESS NOTE    Buster Yoho  [id number] DOB: 29-Jun-1946 DOA: 06/27/2021 PCP: Henreitta Cea, MD   Brief Narrative:  This 75 years old male who presented with worsening abdominal pain associated with distention. CT abdomen showed findings concerning for primary colon cancer complicated by colonic obstruction with pneumatosis. He had an evidence of bowel perforation with small volume pneumoperitoneum.  He was evaluated by general surgery and underwent exploratory laparotomy with open colectomy and ileostomy.  Patient had intraoperative hypotension requiring pressors and mechanical ventilation.  Patient was transferred to ICU.  He had postoperative confusion and delirium and was placed on Precedex which was subsequently discontinued.  Patient was extubated and off pressers.  Patient has developed sudden jerking movements.  MRI and EEG negative. Seizures ruled out. Patient has improved medically and from surgical standpoint they feel patient can be discharged to SNF. Follow-up with Dr. Morey Hummingbird in outpatient setting.   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 Completed IV Zosyn for 5 days on 06/27/21 Continue dysphagia 3 diet. Pathology reports will be presented at GI tumor board, need outpatient follow-up. Further management as per general surgery. Repeat CT Abd /Pelvis today.  Chronic hypoxic and hypercarbic respiratory failure in the context of advanced COPD and ongoing  tobacco abuse. Per Pulmonary: -Limited pulmonary reserve, actually on nocturnal CPAP at baseline due to advanced lung disease and pulmonary deconditioning. Continue supplemental oxygen with nocturnal BIPAP and as needed.. Continue with scheduled bronchodilators and inhaled corticosteroids.  Acute metabolic encephalopathy: > Resolved. His delirium has resolved and he is off Precedex drip. He is back to his baseline mental status.   He does have intermittent confusion due to CO2 retention. Patient was emphasized about the importance of BiPAP use.  Atrial fibrillation: Heart rate controlled, continue metoprolol.  Essential hypertension: Blood pressure remains elevated, continue amlodipine 10 mg daily Continue hydralazine 20 mg IV every 6 hours as needed Increase metoprolol to 25 mg daily 12 hours.  Anemia of chronic disease: No signs of any active bleeding.   Hemoglobin remained stable.  Sudden jerking and tremors: > Improved. MRI unremarkable.  EEG no evidence of seizures.  GERD : Continue pantoprazole.  History of prostate cancer : Stable / in remission.   DVT prophylaxis: Heparin sq Code Status: Full code Family Communication:  No family at bed side. Disposition Plan:   Status is: Inpatient  Remains inpatient appropriate because:Inpatient level of care appropriate due to severity of illness  Dispo: The patient is from: Home              Anticipated d/c is to: SNF              Patient currently is not medically stable to d/c.   Difficult to place patient No   Consultants:  General surgery PCCM Neurology  Procedures: Exploratory laparotomy, abdominal colectomy with end ileostomy on 06/14/2021  antimicrobials:   Anti-infectives (From admission, onward)    Start     Dose/Rate Route Frequency Ordered Stop   06/11/2021 1400  piperacillin-tazobactam (ZOSYN) IVPB 3.375 g        3.375 g 12.5 mL/hr over 240 Minutes Intravenous Every 8 hours 06/23/2021 1357 06/27/21 1016    06/17/2021 0745  cefoTEtan (CEFOTAN) 2 g in sodium chloride 0.9 % 100 mL IVPB        2 g 200 mL/hr over 30 Minutes Intravenous On call to O.R. 06/15/2021 0740 06/20/2021 0831   07/02/2021 0745  sodium chloride 0.9 % with cefoTEtan (CEFOTAN) ADS Med       Note to Pharmacy: Charmayne Sheer   : cabinet override      06/18/2021 0745 06/28/2021 0854   06/21/2021 0700  piperacillin-tazobactam (ZOSYN) IVPB 3.375 g        3.375 g 100 mL/hr over 30 Minutes Intravenous  Once 06/27/2021 0653 06/24/2021 0733        Subjective: Patient was seen and examined at bedside.  Overnight events noted. Patient is lying comfortably on the bed, denies any abdominal pain. He is able to pass flatus and has a bowel movement. Heart rate remains controlled.  Blood pressure still remains elevated.  Objective: Vitals:   07/05/21 0829 07/05/21 1000 07/05/21 1200 07/05/21 1215  BP:  (!) 152/121 (!) 169/59   Pulse:  72 72   Resp:  18 (!) 22   Temp: (!) 96.6 F (35.9 C)   97.6 F (36.4 C)  TempSrc: Axillary   Oral  SpO2: 100% 100% 97%   Weight:      Height:        Intake/Output Summary (Last 24 hours) at 07/05/2021 1302 Last data filed at 07/05/2021 1027 Gross per 24 hour  Intake 480 ml  Output 775 ml  Net -295 ml   Filed Weights   07/03/21 0500 07/04/21 0500 07/05/21 0500  Weight: 72.5 kg 75.9 kg 70.1 kg    Examination:  General exam: Appears chronically ill looking, not in any acute distress. Respiratory system: Clear to auscultation bilaterally, respiratory effort normal. Cardiovascular system: S1-S2 heard, regular rate and rhythm, no murmur.   Gastrointestinal system: Abdomen is soft, nontender, nondistended.  Ileostomy bag noted on the right side, midline surgical scar noted with dressing.  Bowel sounds +, stool+ in appliance. Central nervous system: Alert and oriented x 3 . No focal neurological deficits. Extremities: No edema, no cyanosis, no clubbing. Skin: No rashes, lesions or ulcers Psychiatry: Mood and  affect appropriate.    Data Reviewed: I have personally reviewed following labs and imaging studies  CBC: Recent Labs  Lab 06/30/21 0303 07/01/21 0257 07/02/21 0301 07/03/21 0243 07/04/21 0244 07/05/21 0306  WBC 11.8* 10.3 8.7 9.6 14.6* 16.5*  NEUTROABS 9.0* 7.7 6.4 7.1 12.5*  --   HGB 9.0* 8.5* 8.9* 9.0* 8.8* 8.7*  HCT 29.3* 28.3* 29.5* 29.4* 28.4* 28.3*  MCV 89.3 91.3 91.9 91.0 89.9 91.3  PLT 389 373 377 375 392 [id number]   Basic Metabolic Panel: Recent Labs  Lab 06/30/21 0303 07/01/21 0257 07/02/21 0301 07/02/21 1907 07/03/21 0243 07/04/21 0244 07/05/21 0306  NA 136 140 138 140 138 139 139  K 4.1 4.0 4.2 4.4 4.4 4.3 4.0  CL 97* 98 97* 99 98 98 98  CO2 33* 36* 35* 33* 32 32 34*  GLUCOSE 86 98 93 122* 96 91 90  BUN '11 10 10 11 12 14 14  '$ CREATININE 0.61 0.64 0.65 0.68 0.75 0.76 0.76  CALCIUM 8.4* 8.6* 8.9 9.1 8.9 8.9 9.1  MG 1.9 1.8 1.6*  --  2.0 1.9  --  PHOS 2.9 3.2 3.7  --  3.8 3.7  --    GFR: Estimated Creatinine Clearance: 79.1 mL/min (by C-G formula based on SCr of 0.76 mg/dL). Liver Function Tests: Recent Labs  Lab 06/30/21 0303 07/01/21 0257 07/02/21 0301 07/03/21 0243 07/04/21 0244  AST 13* 13* 11* 11* 12*  ALT 35 '27 21 18 16  '$ ALKPHOS 64 63 62 60 69  BILITOT 0.9 0.8 0.9 0.8 0.8  PROT 5.2* 5.3* 5.3* 5.4* 5.4*  ALBUMIN 2.4* 2.4* 2.5* 2.5* 2.6*   No results for input(s): LIPASE, AMYLASE in the last 168 hours. Recent Labs  Lab 07/03/21 1254  AMMONIA 25   Coagulation Profile: No results for input(s): INR, PROTIME in the last 168 hours. Cardiac Enzymes: No results for input(s): CKTOTAL, CKMB, CKMBINDEX, TROPONINI in the last 168 hours. BNP (last 3 results) No results for input(s): PROBNP in the last 8760 hours. HbA1C: No results for input(s): HGBA1C in the last 72 hours. CBG: Recent Labs  Lab 07/04/21 1925 07/04/21 2320 07/05/21 0356 07/05/21 0805 07/05/21 1212  GLUCAP 127* 102* 85 72 77   Lipid Profile: No results for input(s): CHOL,  HDL, LDLCALC, TRIG, CHOLHDL, LDLDIRECT in the last 72 hours. Thyroid Function Tests: No results for input(s): TSH, T4TOTAL, FREET4, T3FREE, THYROIDAB in the last 72 hours. Anemia Panel: No results for input(s): VITAMINB12, FOLATE, FERRITIN, TIBC, IRON, RETICCTPCT in the last 72 hours.  Sepsis Labs: No results for input(s): PROCALCITON, LATICACIDVEN in the last 168 hours.  No results found for this or any previous visit (from the past 240 hour(s)).   Radiology Studies: MR BRAIN W WO CONTRAST  Result Date: 07/03/2021 CLINICAL DATA:  Colon carcinoma.  Metastatic disease evaluation. EXAM: MRI HEAD WITHOUT AND WITH CONTRAST TECHNIQUE: Multiplanar, multiecho pulse sequences of the brain and surrounding structures were obtained without and with intravenous contrast. CONTRAST:  7.44m GADAVIST GADOBUTROL 1 MMOL/ML IV SOLN COMPARISON:  None. FINDINGS: Brain: No acute infarct, mass effect or extra-axial collection. Foci of chronic microhemorrhage in the cerebellum. No acute hemorrhage. There is multifocal hyperintense T2-weighted signal within the white matter. Generalized volume loss without a clear lobar predilection. There are old centrum semiovale and basal ganglia small vessel infarcts. The midline structures are normal. There is no abnormal contrast enhancement. No mass lesion. No intracranial metastatic disease. Vascular: Major flow voids are preserved. Skull and upper cervical spine: Normal calvarium and skull base. Visualized upper cervical spine and soft tissues are normal. Sinuses/Orbits:Mastoid effusions. Paranasal sinuses are clear. Normal orbits. IMPRESSION: 1. No intracranial metastatic disease. 2. Chronic small vessel disease and volume loss. Electronically Signed   By: KUlyses JarredM.D.   On: 07/03/2021 19:21   DG CHEST PORT 1 VIEW  Result Date: 07/04/2021 CLINICAL DATA:  History of bowel perforation status post resection, septic shock, respiratory failure, extubated 06/25/2021 EXAM:  PORTABLE CHEST 1 VIEW COMPARISON:  Chest radiograph 06/26/2021 FINDINGS: The cardiomediastinal silhouette is normal. Aeration of the lung bases has improved since 06/26/2021, with residual hazy opacity projecting over the left base with silhouetting of the medial left hemidiaphragm. The upper lungs are well aerated. There is no significant pleural effusion. There is no pneumothorax. The bones are unremarkable. IMPRESSION: Improved aeration of the lung bases since 06/26/2021 with residual left basilar opacities likely reflecting atelectasis. Electronically Signed   By: PValetta MoleM.D.   On: 07/04/2021 11:16   EEG adult  Result Date: 07/03/2021 YLora Havens MD     07/03/2021  4:47 PM Patient Name: JJeneen Rinks  [place] MRN: [id number] Epilepsy Attending: Lora Havens Referring Physician/Provider: Dr Carlisle Cater Date: 07/03/2021 Duration: 22.10 mins Patient history: 75yo m with sudden jerking movements. EEG to evaluate for seizure Level of alertness: Awake, drowsy AEDs during EEG study: None Technical aspects: This EEG study was done with scalp electrodes positioned according to the 10-20 International system of electrode placement. Electrical activity was acquired at a sampling rate of '500Hz'$  and reviewed with a high frequency filter of '70Hz'$  and a low frequency filter of '1Hz'$ . EEG data were recorded continuously and digitally stored. Description: The posterior dominant rhythm consists of 7.5 Hz activity of moderate voltage (25-35 uV) seen predominantly in posterior head regions, symmetric and reactive to eye opening and eye closing. Drowsiness was characterized by attenuation of the posterior background rhythm. EEG showed intermittent generalized 3 to 6 Hz theta-delta slowing. Intermittent episodes of right and left extremity brief jerks were noted. Concomitant EEG before, during and after the event did not show any EEG changes suggest seizure. Hyperventilation and photic stimulation were not performed.    ABNORMALITY - Intermittent slow, generalized IMPRESSION: This study is suggestive of mild diffuse encephalopathy, nonspecific etiology. No seizures or epileptiform discharges were seen throughout the recording. Intermittent episodes of right and left extremity brief jerks were noted without concomitant EEG change. These episode were NOT epileptic. [name]    Scheduled Meds:  acetaminophen  650 mg Oral Q6H   amLODipine  10 mg Oral Daily   aspirin EC  81 mg Oral Daily   budesonide (PULMICORT) nebulizer solution  0.5 mg Nebulization BID   Chlorhexidine Gluconate Cloth  6 each Topical Daily   clopidogrel  75 mg Oral Daily   folic acid  1 mg Oral Daily   heparin injection (subcutaneous)  5,000 Units Subcutaneous Q8H   ibuprofen  400 mg Oral TID   ipratropium-albuterol  3 mL Nebulization BID   iron polysaccharides  150 mg Oral Daily   lip balm  1 application Topical BID   mouth rinse  15 mL Mouth Rinse BID   methocarbamol  500 mg Oral TID   metoprolol tartrate  25 mg Oral BID   multivitamin with minerals  1 tablet Oral Daily   pantoprazole  40 mg Oral Daily   thiamine  100 mg Oral Daily   Continuous Infusions:  sodium chloride Stopped (07/02/21 2203)     LOS: 13 days    Time spent: 35 mins.    Shawna Clamp, MD [facility]   If 7PM-7AM, please contact night-coverage

## 2021-07-05 DEATH — deceased

## 2021-07-06 LAB — GLUCOSE, CAPILLARY
Glucose-Capillary: 87 mg/dL (ref 70–99)
Glucose-Capillary: 61 mg/dL — ABNORMAL LOW (ref 70–99)
Glucose-Capillary: 85 mg/dL (ref 70–99)
Glucose-Capillary: 142 mg/dL — ABNORMAL HIGH (ref 70–99)
Glucose-Capillary: 126 mg/dL — ABNORMAL HIGH (ref 70–99)
Glucose-Capillary: 92 mg/dL (ref 70–99)

## 2021-07-06 LAB — CBC
HCT: 28 % — ABNORMAL LOW (ref 39.0–52.0)
Hemoglobin: 8.6 g/dL — ABNORMAL LOW (ref 13.0–17.0)
MCH: 28.2 pg (ref 26.0–34.0)
MCHC: 30.7 g/dL (ref 30.0–36.0)
MCV: 91.8 fL (ref 80.0–100.0)
Platelets: 368 10*3/uL (ref 150–400)
RBC: 3.05 MIL/uL — ABNORMAL LOW (ref 4.22–5.81)
RDW: 19.9 % — ABNORMAL HIGH (ref 11.5–15.5)
WBC: 11.1 10*3/uL — ABNORMAL HIGH (ref 4.0–10.5)
nRBC: 0 % (ref 0.0–0.2)

## 2021-07-06 MED ORDER — DEXTROSE 5 % IV BOLUS
500.0000 mL | Freq: Once | INTRAVENOUS | Status: AC
  Administered 2021-07-06: 500 mL via INTRAVENOUS

## 2021-07-06 NOTE — Progress Notes (Signed)
Occupational Therapy Treatment Patient Details Name: Daniel King MRN: [id number] DOB: November 28, 1945 Today's Date: 07/06/2021    History of present illness Patient is a 75 year old male who presented on 06/11/2021 with two day history of abdominal pain and discomfort. Patient's CT was concerning for preforation. 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 Patient was also found to have septic shock, acute respiratory failure and blood loss anemia. Patient was extubated on 8/22.   PMH: COPD, A fib,h/o CVA   OT comments  Treatment focused on promoting functional mobility and improving transfers and standing tolerance needed to advance ADLs. Patient min assist x 2 stand from hight bed but mod x 2 to stand from recliner. Patinet able to take steps to recliner with min assist +2 and stand and perform mini squats. Needs tending to buckle. Patient asked to perform knee extension reps. Patient and spouse educated on leg exercises to do while in chair. Also educated to cough harder and use Incentive spirometer as he sounded "junky" and cough weak. Vital signs stable throughout treatment.   Follow Up Recommendations  SNF    Equipment Recommendations  None recommended by OT    Recommendations for Other Services      Precautions / Restrictions Precautions Precautions: Fall Precaution Comments: ileostomy, monitor BP and HR, abdominal wound, knees want to buckle Restrictions Weight Bearing Restrictions: No       Mobility Bed Mobility Overal bed mobility: Needs Assistance Bed Mobility: Rolling;Sidelying to Sit Rolling: Min guard Sidelying to sit: Mod assist;HOB elevated       General bed mobility comments: Patient able to roll to the right. Needed assistance for trunk to transfer in to sitting.    Transfers Overall transfer level: Needs assistance Equipment used: Rolling walker (2 wheeled) Transfers: Sit to/from Stand Sit to  Stand: Min assist;From elevated surface         General transfer comment: Min assist x 2 to stand from elevated bed to take steps to recliner. Patient's knees wanting to buckle. Patient placed in the chair. After patient rested for several minutes patient stood from recliner with mod x 2. Patient then min assist x 2 with walker to perform 6 mini squats to work on leg strengt with verbal and tactile cues to extend knees further and stand upright. .    Balance Overall balance assessment: Needs assistance Sitting-balance support: No upper extremity supported;Feet supported Sitting balance-Leahy Scale: Fair     Standing balance support: During functional activity Standing balance-Leahy Scale: Poor Standing balance comment: reliant on walker and external assist                           ADL either performed or assessed with clinical judgement   ADL                                               Vision Patient Visual Report: No change from baseline Vision Assessment?: No apparent visual deficits   Perception     Praxis      Cognition Arousal/Alertness: Awake/alert Behavior During Therapy: WFL for tasks assessed/performed Overall Cognitive Status: Within Functional Limits for tasks assessed  Exercises Other Exercises Other Exercises: Patient performed knee extension each leg x 10   Shoulder Instructions       General Comments      Pertinent Vitals/ Pain       Pain Assessment: No/denies pain  Home Living                                          Prior Functioning/Environment              Frequency  Min 2X/week        Progress Toward Goals  OT Goals(current goals can now be found in the care plan section)  Progress towards OT goals: Progressing toward goals  Acute Rehab OT Goals Patient Stated Goal: home OT Goal Formulation: With patient/family Time  For Goal Achievement: 07/11/21 Potential to Achieve Goals: [place] Discharge plan remains appropriate    Co-evaluation          OT goals addressed during session: Strengthening/ROM (functional mobility)      AM-PAC OT "6 Clicks" Daily Activity     Outcome Measure   Help from another person eating meals?: A Lot Help from another person taking care of personal grooming?: A Lot Help from another person toileting, which includes using toliet, bedpan, or urinal?: Total Help from another person bathing (including washing, rinsing, drying)?: A Lot Help from another person to put on and taking off regular upper body clothing?: A Lot Help from another person to put on and taking off regular lower body clothing?: A Lot 6 Click Score: 11    End of Session Equipment Utilized During Treatment: Gait belt;Rolling walker;Oxygen  OT Visit Diagnosis: Other abnormalities of gait and mobility (R26.89);Unsteadiness on feet (R26.81);Muscle weakness (generalized) (M62.81)   Activity Tolerance Patient limited by fatigue   Patient Left in chair;with call bell/phone within reach;with chair alarm set;with family/visitor present   Nurse Communication Mobility status        Time: [id number] OT Time Calculation (min): 18 min  Charges: OT General Charges $OT Visit: 1 Visit OT Treatments $Therapeutic Activity: 8-22 mins  Derl Barrow, OTR/L [place]  Office [phone] Pager: [place] 07/06/2021, 3:04 PM

## 2021-07-06 NOTE — Progress Notes (Signed)
Progress Note  14 Days Post-Op  Subjective: Patient and wife report some confusion this AM. Pain well controlled. Patient removed CPAP intermittently overnight. Tolerating diet and having output from stoma.   Objective: Vital signs in last 24 hours: Temp:  [96.2 F (35.7 C)-97.6 F (36.4 C)] 97.6 F (36.4 C) (09/02 0800) Pulse Rate:  [59-110] 67 (09/02 0600) Resp:  [12-28] 25 (09/02 0600) BP: (97-169)/(40-85) 111/40 (09/02 0600) SpO2:  [90 %-100 %] 100 % (09/02 0730) Weight:  [69.7 kg] 69.7 kg (09/02 0500) Last BM Date: 07/05/21  Intake/Output from previous day: 09/01 0701 - 09/02 0700 In: 150 [P.O.:150] Out: 1210 [Urine:480; Stool:730] Intake/Output this shift: No intake/output data recorded.  PE: Gen: Alert, cooperative CV: RRR Pulm: unlabored Abd: Soft, nondistended, minimally tender. Midline wound clean without active bleeding; stoma viable but a little prolapsed, soft stool in the bag as well as air Ext: SCDs in place   Lab Results:  Recent Labs    07/05/21 0306 07/06/21 0250  WBC 16.5* 11.1*  HGB 8.7* 8.6*  HCT 28.3* 28.0*  PLT 390 368   BMET Recent Labs    07/04/21 0244 07/05/21 0306  NA 139 139  K 4.3 4.0  CL 98 98  CO2 32 34*  GLUCOSE 91 90  BUN 14 14  CREATININE 0.76 0.76  CALCIUM 8.9 9.1   PT/INR No results for input(s): LABPROT, INR in the last 72 hours. CMP     Component Value Date/Time   NA 139 07/05/2021 0306   K 4.0 07/05/2021 0306   CL 98 07/05/2021 0306   CO2 34 (H) 07/05/2021 0306   GLUCOSE 90 07/05/2021 0306   BUN 14 07/05/2021 0306   CREATININE 0.76 07/05/2021 0306   CALCIUM 9.1 07/05/2021 0306   PROT 5.4 (L) 07/04/2021 0244   ALBUMIN 2.6 (L) 07/04/2021 0244   AST 12 (L) 07/04/2021 0244   ALT 16 07/04/2021 0244   ALKPHOS 69 07/04/2021 0244   BILITOT 0.8 07/04/2021 0244   GFRNONAA >60 07/05/2021 0306   Lipase  No results found for: LIPASE     Studies/Results: CT ABDOMEN PELVIS W CONTRAST  Result Date:  07/05/2021 CLINICAL DATA:  Patient status post colectomy and diverting ileostomy for bowel obstruction. EXAM: CT ABDOMEN AND PELVIS WITH CONTRAST TECHNIQUE: Multidetector CT imaging of the abdomen and pelvis was performed using the standard protocol following bolus administration of intravenous contrast. CONTRAST:  93m OMNIPAQUE IOHEXOL 350 MG/ML SOLN COMPARISON:  CT 08/22/2021 FINDINGS: Lower chest: Moderate bilateral layering pleural effusions and mild passive atelectasis of the lower lobes. Hepatobiliary: No focal hepatic lesion. No biliary duct dilatation. Common bile duct is normal. Pancreas: Pancreas is normal. No ductal dilatation. No pancreatic inflammation. Spleen: Spleen is small. Adrenals/urinary tract: Adrenal glands normal. Large calculus within the RIGHT renal pelvis measures 3.8 cm. No obstruction. Ureters and bladder normal. Foley catheter within collapsed bladder. Stomach/Bowel: Stomach duodenum are normal. Small bowel is mildly dilated suggest ileus. RIGHT abdominal wall ileostomy without evidence obstruction. Post colectomy. Rectal pouch noted. No pneumatosis or intraperitoneal free air. Moderate volume intraperitoneal free fluid. Vascular/Lymphatic: Prior graft repair of abdominal aorta. No retroperitoneal adenopathy. Reproductive: Prostate unremarkable Other: Ventral midline wound is open.  No complicating features. Musculoskeletal: No aggressive osseous lesion IMPRESSION: 1. Post colectomy with ileostomy. Mild dilatation of the small bowel and slow transit of the oral contrast suggest ileus. No evidence of high-grade obstruction. 2. No pneumatosis or intraperitoneal free air. 3. Moderate volume intraperitoneal free fluid and bilateral large  pleural effusions suggest volume overload. Electronically Signed   By: Suzy Bouchard M.D.   On: 07/05/2021 15:52    Anti-infectives: Anti-infectives (From admission, onward)    Start     Dose/Rate Route Frequency Ordered Stop   07/02/2021 1400   piperacillin-tazobactam (ZOSYN) IVPB 3.375 g        3.375 g 12.5 mL/hr over 240 Minutes Intravenous Every 8 hours 06/07/2021 1357 06/27/21 1016   07/04/2021 0745  cefoTEtan (CEFOTAN) 2 g in sodium chloride 0.9 % 100 mL IVPB        2 g 200 mL/hr over 30 Minutes Intravenous On call to O.R. 06/24/2021 0740 06/07/2021 0831   06/16/2021 0745  sodium chloride 0.9 % with cefoTEtan (CEFOTAN) ADS Med       Note to Pharmacy: Charmayne Sheer   : cabinet override      06/18/2021 0745 06/25/2021 0854   06/25/2021 0700  piperacillin-tazobactam (ZOSYN) IVPB 3.375 g        3.375 g 100 mL/hr over 30 Minutes Intravenous  Once 06/05/2021 0653 06/09/2021 0733        Assessment/Plan POD14 Ex-lap with TAC and end ileostomy Dr. Johney Maine 06/15/2021 - tolerating diet and having ileostomy output  - WOC following, WTD to midline wound BID - PO pain control seems improved - continue to mobilize as tolerated - PT/OT recommending SNF at this time - pathology: T4N1 obstructing rectosigmoid cancer.  Dr. Johney Maine has initiated arrangements for GI tumor board and outpatient oncology follow-up. Patient and family are aware  - CT A/P yesterday with some ascites and anasarca, no evidence of abscess or post-operative complication Urinary retention - foley placed 8/31, urine cx with no growth, TOV today  Jerking movements - EEG negative for seizure activity and MRI brain negative for acute process, No further episodes    FEN: reg diet, supplements VTE: SQ heparin, plavix ID: no current abx; WBC 11 and patient remains afeb   Dispo: TOV today. Got 1st COVID vaccine 8/31. Stable for discharge from a surgical perspective.    Chronic hypoxic and hypercarbic respiratory failure in setting of advanced COPD and current tobacco abuse - per CCM/TRH, appreciate assistance  Acute metabolic encephalopathy - delirium precautions, some confusion reported this AM but improving A. 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

## 2021-07-06 NOTE — Progress Notes (Addendum)
PROGRESS NOTE    Juaquin Burlin  [id number] DOB: 04-05-1946 DOA: 06/08/2021 PCP: Henreitta Cea, MD    Brief Narrative:  Mr.Daniely was admitted to the hospital with working diagnosis of acute bowel perforation with pneumatosis, in the setting of bowel obstruction due to T4N1 obstructing rectosigmoid cancer. Prolonged hospitalization.  75 year old male w who presented with abdominal pain and distention was diagnosed with colonic obstruction with pneumatosis per CT.  Patient was evaluated by general surgery and underwent exploratory laparotomy with open colectomy in the ileostomy.  Patient developed septic shock, requiring vasopressors and mechanical ventilation.  He eventually was liberated from mechanical ventilation and vasopressors were discontinued.    In the intensive care unit he developed delirium requiring dexmedetomidine. Clinically has been progressing, he is pending to be transferred to skilled nursing facility.  TRH remains as Optometrist on the case.   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  Bowel obstruction due to T4N1 obstructing rectosigmoid cancer. Sp colectomy and ileostomy.  Pathology positive for invasive colorectal adenocarcinoma.   Patient has completed antibiotic therapy with IV zosyn. Continue to be very weak and deconditioned, on dysphagia 3 diet.    2. COPD with chronic hypoxemic and hypercapnic respiratory failure.  Patient has been using nocturnal Bipap at home has Cpap. His oxygenation has been stable. Continue airway clearing  techniques and close oxymetry monitoring.   3. Metabolic encephalopathy with delirium/ moderate calorie protein malnutrition. Continue neuro checks per unit protocol.  PT and OT.  Continue with nutritional supplementation.   4. Atrial fibrillation rate controlled, continue with metoprolol. Currently not on anticoagulation.  Discontinue telemetry.   5. HTN Continue blood pressure control with amlodipine and metoprolol.  6. Anemia of chronic disease hgb and hct have been stable.   7. GERD. Continue antiacid therapy with pantoprazole.   8. Hx of prostate cancer. On remission, follow up as outpatient.   9. Coccyx pressure ulcer with not able to stage, not clear if present on admission.   Patient will follow as outpatient for cancer specific therapy.   Status is: Inpatient  Remains inpatient appropriate because:Inpatient level of care appropriate due to severity of illness  Dispo: The patient is from: Home              Anticipated d/c is to: SNF              Patient currently is medically stable to d/c.   Difficult to place patient No  DVT prophylaxis: Enoxaparin   Code Status:    full  Family Communication:  No family at the bedside      Nutrition Status: Nutrition Problem: Moderate Malnutrition Etiology: chronic illness (COPD) Signs/Symptoms: mild fat depletion, mild muscle depletion, moderate muscle depletion Interventions: Magic cup, MVI     Skin Documentation: Pressure Injury 06/25/21 Coccyx Medial;Right;Left Deep Tissue Pressure Injury - Purple or maroon localized area of discolored intact skin or blood-filled blister due to damage of underlying soft tissue from pressure and/or shear. (Active)  06/25/21 1300  Location: Coccyx  Location Orientation: Medial;Right;Left  Staging: Deep Tissue Pressure Injury - Purple or maroon  localized area of discolored intact skin or blood-filled blister due to damage of underlying soft tissue from pressure and/or shear.  Wound  Description (Comments):   Present on Admission:      Subjective: Patient is out of bed to chair, no nausea or vomiting, no dyspnea or chest pain, continue very weak and deconditioned, not yet back to his baseline.   Objective: Vitals:   07/06/21 0900 07/06/21 1000 07/06/21 1100 07/06/21 1200  BP:   (!) 132/50   Pulse: 68 66 62   Resp: 18 (!) 23 (!) 21   Temp:    97.6 F (36.4 C)  TempSrc:    Axillary  SpO2: 100% 99% 100%   Weight:      Height:        Intake/Output Summary (Last 24 hours) at 07/06/2021 1316 Last data filed at 07/06/2021 1100 Gross per 24 hour  Intake 150 ml  Output 1435 ml  Net -1285 ml   Filed Weights   07/04/21 0500 07/05/21 0500 07/06/21 0500  Weight: 75.9 kg 70.1 kg 69.7 kg    Examination:   General: Not in pain or dyspnea  Neurology: Awake and alert, non focal  E ENT: no pallor, no icterus, oral mucosa moist Cardiovascular: No JVD. S1-S2 present, rhythmic, no gallops, rubs, or murmurs. No lower extremity edema. Pulmonary: positive breath sounds bilaterally, adequate air movement, no wheezing, rhonchi or rales. Gastrointestinal. Abdomen soft, ostomy in place, surgical wound clean Skin. No rashes Musculoskeletal: no joint deformities     Data Reviewed: I have personally reviewed following labs and imaging studies  CBC: Recent Labs  Lab 06/30/21 0303 07/01/21 0257 07/02/21 0301 07/03/21 0243 07/04/21 0244 07/05/21 0306 07/06/21 0250  WBC 11.8* 10.3 8.7 9.6 14.6* 16.5* 11.1*  NEUTROABS 9.0* 7.7 6.4 7.1 12.5*  --   --   HGB 9.0* 8.5* 8.9* 9.0* 8.8* 8.7* 8.6*  HCT 29.3* 28.3* 29.5* 29.4* 28.4* 28.3* 28.0*  MCV 89.3 91.3 91.9 91.0 89.9 91.3 91.8  PLT 389 373 377 375 392 390 [id number]   Basic Metabolic Panel: Recent Labs  Lab 06/30/21 0303 07/01/21 0257 07/02/21 0301 07/02/21 1907 07/03/21 0243 07/04/21 0244 07/05/21 0306  NA 136 140 138 140 138 139 139  K 4.1 4.0 4.2 4.4 4.4 4.3 4.0  CL 97* 98 97* 99 98 98 98  CO2 33* 36* 35* 33* 32  32 34*  GLUCOSE 86 98 93 122* 96 91 90  BUN '11 10 10 11 12 14 14  '$ CREATININE 0.61 0.64 0.65 0.68 0.75 0.76 0.76  CALCIUM 8.4* 8.6* 8.9 9.1 8.9 8.9 9.1  MG 1.9 1.8 1.6*  --  2.0 1.9  --   PHOS 2.9 3.2 3.7  --  3.8 3.7  --    GFR: Estimated Creatinine Clearance: 78.7 mL/min (by C-G formula based on SCr of 0.76 mg/dL). Liver Function Tests: Recent Labs  Lab 06/30/21 0303 07/01/21 0257 07/02/21 0301 07/03/21 0243 07/04/21 0244  AST 13* 13* 11* 11* 12*  ALT 35 '27 21 18 16  '$ ALKPHOS 64 63 62 60 69  BILITOT 0.9 0.8 0.9 0.8 0.8  PROT 5.2* 5.3* 5.3* 5.4* 5.4*  ALBUMIN 2.4* 2.4* 2.5* 2.5* 2.6*   No results for input(s): LIPASE, AMYLASE in the last 168 hours. Recent Labs  Lab 07/03/21 1254  AMMONIA 25   Coagulation Profile: No results for input(s): INR, PROTIME in the last 168 hours. Cardiac Enzymes: No results for input(s): CKTOTAL, CKMB, CKMBINDEX, TROPONINI in the last 168 hours.  BNP (last 3 results) No results for input(s): PROBNP in the last 8760 hours. HbA1C: No results for input(s): HGBA1C in the last 72 hours. CBG: Recent Labs  Lab 07/05/21 2324 07/06/21 0321 07/06/21 0801 07/06/21 0834 07/06/21 1118  GLUCAP 85 87 61* 85 142*   Lipid Profile: No results for input(s): CHOL, HDL, LDLCALC, TRIG, CHOLHDL, LDLDIRECT in the last 72 hours. Thyroid Function Tests: No results for input(s): TSH, T4TOTAL, FREET4, T3FREE, THYROIDAB in the last 72 hours. Anemia Panel: No results for input(s): VITAMINB12, FOLATE, FERRITIN, TIBC, IRON, RETICCTPCT in the last 72 hours.    Radiology Studies: I have reviewed all of the imaging during this hospital visit personally     Scheduled Meds:  acetaminophen  650 mg Oral Q6H   amLODipine  10 mg Oral Daily   aspirin EC  81 mg Oral Daily   budesonide (PULMICORT) nebulizer solution  0.5 mg Nebulization BID   Chlorhexidine Gluconate Cloth  6 each Topical Daily   clopidogrel  75 mg Oral Daily   folic acid  1 mg Oral Daily   heparin  injection (subcutaneous)  5,000 Units Subcutaneous Q8H   ibuprofen  400 mg Oral TID   ipratropium-albuterol  3 mL Nebulization BID   iron polysaccharides  150 mg Oral Daily   lip balm  1 application Topical BID   mouth rinse  15 mL Mouth Rinse BID   methocarbamol  500 mg Oral TID   metoprolol tartrate  25 mg Oral BID   multivitamin with minerals  1 tablet Oral Daily   pantoprazole  40 mg Oral Daily   thiamine  100 mg Oral Daily   Continuous Infusions:  sodium chloride Stopped (07/02/21 2203)     LOS: 14 days        Ousman Dise Gerome Apley, MD

## 2021-07-06 NOTE — Progress Notes (Signed)
Pt transferred to [facility] from ICU. Answered all orientation questions appropriately. A/Ox4 but lethargic. Pt on tele, bed alarm on, oriented to room, clean linen,primafit intact, ileostomy bag intact with minimal green output at time of pt arrival.  4[facility] [place] No complaints or requests at this time. Call bell within reach.

## 2021-07-06 NOTE — Progress Notes (Signed)
  Speech Language Pathology Treatment: Dysphagia  Patient Details Name: Daniel King MRN: [id number] DOB: [birth year] Today's Date: 07/06/2021 Time: [id number] SLP Time Calculation (min) (ACUTE ONLY): 9 min  Assessment / Plan / Recommendation Clinical Impression  Since dietary advancement, SLP follow up to address dysphagia goals.  Pt reports good intake and denies coughing with po.  Wife not present at session.  Intake varies and pt is afebrile with coarse lung sounds and crackles.   CXR 8/31 showed Improved aeration of the lung bases since 06/26/2021 with residual left basilar opacities likely reflecting atelectasis.     Pt did not pass 3 ounce Yale as he could not consume 3 ounces sequentially, but he did not demonstrate any indication of aspiration.    Given pt with laryngeal penetration on MBS and has a weak cough - SLP does recommend to intiiate RMST to strengthen cough and improve laryngeal closure to clear penetrates from larynx.  Will look to initiate RMST with surgery approval next week with hopeful improved medical condition.  Recommend continue diet witih strict aspiration precautions.    HPI HPI: 75 yo male adm to [facility] on perforated bowel, gangrene of colon from obstruction s/p abdominal colectomy/ileostomy 06/20/2021.  Pt required intubation and was extubated on 8/22.  CXR showed progresive opacities at lung bases on 8/23//22. 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  Pt with reverse lordosis of cervical spine.  Swallow eval ordered.  Pt has NG removed at this time.  Wife, Daniel King, present and pt and wife deny pt having dysphagia PTA but report he did see an SLP at [facility] for swallowing following his cva.      SLP Plan  Continue with current plan of care       Recommendations  Diet recommendations: Regular;Thin liquid Liquids provided via: Cup;Straw Medication Administration: Whole meds with  puree Supervision: Staff to assist with self feeding Compensations: Minimize environmental distractions;Slow rate;Small sips/bites Postural Changes and/or Swallow Maneuvers: Seated upright 90 degrees;Upright 30-60 min after meal                Oral Care Recommendations: Oral care BID Follow up Recommendations: Other (comment) (TBD, likely none) SLP Visit Diagnosis: Dysphagia, unspecified (R13.10) Plan: Continue with current plan of care       GO               [name], MS [place] Office [phone] Pager [phone]  Macario Golds 07/06/2021, 10:53 AM

## 2021-07-06 NOTE — TOC Progression Note (Signed)
Transition of Care [facility]) - Progression Note    Patient Details  Name: Daniel King MRN: [id number] Date of Birth: 1946/01/19  Transition of Care [facility] CM/SW Contact  Leeroy Cha, RN Phone Number: 07/06/2021, 10:31 AM  Clinical Narrative:    Tct to [place], no beds available until Monday Sept 5th and will need new covid test. Md is aware   Expected Discharge Plan: Home/Self Care Barriers to Discharge: Continued Medical Work up  Expected Discharge Plan and Services Expected Discharge Plan: Home/Self Care   Discharge Planning Services: CM Consult   Living arrangements for the past 2 months: Single Family Home                                       Social Determinants of Health (SDOH) Interventions    Readmission Risk Interventions No flowsheet data found.

## 2021-07-07 NOTE — Progress Notes (Signed)
Assessment & Plan: POD#15 - Ex lap with TAC and end ileostomy - Dr. Johney Maine - 06/29/2021 - tolerating diet and having ileostomy output  - WOC following, WTD to midline wound BID - PO pain control seems improved - continue to mobilize as tolerated - PT/OT recommending SNF at this time - pathology: T4N1 obstructing rectosigmoid cancer.  Dr. Johney Maine has initiated arrangements for GI tumor board and outpatient oncology follow-up. Patient and family are aware    FEN: reg diet, supplements VTE: SQ heparin, plavix ID: no current abx   Dispo: Stable for discharge from a surgical perspective. To [facility] rehab - ? Monday or Tuesday per family   Chronic hypoxic and hypercarbic respiratory failure in setting of advanced COPD and current tobacco abuse A. Fib with RVR and prolonged QTc HTN ABL anemia on likely anemia of chronic disease GERD Hx of bladder cancer stage IV HLD Hx of CVA Oct 2021 Severe protein calorie malnutrition  Dysphagia - SLP following, reg diet FTT        Armandina Gemma, MD       [facility]       Office: [phone]   Chief Complaint: Total colectomy with ileostomy  Subjective: Patient in bed, family at bedside.  No complaints.  Objective: Vital signs in last 24 hours: Temp:  [97.5 F (36.4 C)-97.9 F (36.6 C)] 97.9 F (36.6 C) (09/03 0400) Pulse Rate:  [51-66] 51 (09/03 0419) Resp:  [16-23] 16 (09/02 2015) BP: (94-132)/(46-65) 100/46 (09/03 0419) SpO2:  [95 %-100 %] 95 % (09/03 0730) Weight:  [74 kg] 74 kg (09/03 0500) Last BM Date: 07/07/21  Intake/Output from previous day: 09/02 0701 - 09/03 0700 In: -  Out: 1225 [Urine:400; Stool:825] Intake/Output this shift: No intake/output data recorded.  Physical Exam: deferred  Lab Results:  Recent Labs    07/05/21 0306 07/06/21 0250  WBC 16.5* 11.1*  HGB 8.7* 8.6*  HCT 28.3* 28.0*  PLT 390 368   BMET Recent Labs    07/05/21 0306  NA 139  K 4.0  CL 98  CO2 34*  GLUCOSE  90  BUN 14  CREATININE 0.76  CALCIUM 9.1   PT/INR No results for input(s): LABPROT, INR in the last 72 hours. Comprehensive Metabolic Panel:    Component Value Date/Time   NA 139 07/05/2021 0306   NA 139 07/04/2021 0244   K 4.0 07/05/2021 0306   K 4.3 07/04/2021 0244   CL 98 07/05/2021 0306   CL 98 07/04/2021 0244   CO2 34 (H) 07/05/2021 0306   CO2 32 07/04/2021 0244   BUN 14 07/05/2021 0306   BUN 14 07/04/2021 0244   CREATININE 0.76 07/05/2021 0306   CREATININE 0.76 07/04/2021 0244   GLUCOSE 90 07/05/2021 0306   GLUCOSE 91 07/04/2021 0244   CALCIUM 9.1 07/05/2021 0306   CALCIUM 8.9 07/04/2021 0244   AST 12 (L) 07/04/2021 0244   AST 11 (L) 07/03/2021 0243   ALT 16 07/04/2021 0244   ALT 18 07/03/2021 0243   ALKPHOS 69 07/04/2021 0244   ALKPHOS 60 07/03/2021 0243   BILITOT 0.8 07/04/2021 0244   BILITOT 0.8 07/03/2021 0243   PROT 5.4 (L) 07/04/2021 0244   PROT 5.4 (L) 07/03/2021 0243   ALBUMIN 2.6 (L) 07/04/2021 0244   ALBUMIN 2.5 (L) 07/03/2021 0243    Studies/Results: CT ABDOMEN PELVIS W CONTRAST  Result Date: 07/05/2021 CLINICAL DATA:  Patient status post colectomy and diverting ileostomy for bowel obstruction. EXAM: CT  ABDOMEN AND PELVIS WITH CONTRAST TECHNIQUE: Multidetector CT imaging of the abdomen and pelvis was performed using the standard protocol following bolus administration of intravenous contrast. CONTRAST:  109m OMNIPAQUE IOHEXOL 350 MG/ML SOLN COMPARISON:  CT 08/22/2021 FINDINGS: Lower chest: Moderate bilateral layering pleural effusions and mild passive atelectasis of the lower lobes. Hepatobiliary: No focal hepatic lesion. No biliary duct dilatation. Common bile duct is normal. Pancreas: Pancreas is normal. No ductal dilatation. No pancreatic inflammation. Spleen: Spleen is small. Adrenals/urinary tract: Adrenal glands normal. Large calculus within the RIGHT renal pelvis measures 3.8 cm. No obstruction. Ureters and bladder normal. Foley catheter within  collapsed bladder. Stomach/Bowel: Stomach duodenum are normal. Small bowel is mildly dilated suggest ileus. RIGHT abdominal wall ileostomy without evidence obstruction. Post colectomy. Rectal pouch noted. No pneumatosis or intraperitoneal free air. Moderate volume intraperitoneal free fluid. Vascular/Lymphatic: Prior graft repair of abdominal aorta. No retroperitoneal adenopathy. Reproductive: Prostate unremarkable Other: Ventral midline wound is open.  No complicating features. Musculoskeletal: No aggressive osseous lesion IMPRESSION: 1. Post colectomy with ileostomy. Mild dilatation of the small bowel and slow transit of the oral contrast suggest ileus. No evidence of high-grade obstruction. 2. No pneumatosis or intraperitoneal free air. 3. Moderate volume intraperitoneal free fluid and bilateral large pleural effusions suggest volume overload. Electronically Signed   By: SSuzy BouchardM.D.   On: 07/05/2021 15:52      TArmandina Gemma9/01/2021   Patient ID: JFelix Pacini male   DOB: 918-Dec-1947 75y.o.   MRN: [id number]

## 2021-07-07 NOTE — Progress Notes (Signed)
Pt placed on night time BIPAP with 4lpm bleedin. Machine in red outline. Pt tolerate well. Rt will continue to monitor

## 2021-07-07 NOTE — Progress Notes (Signed)
PROGRESS NOTE    Daniel King  [id number] DOB: [birth year] DOA: 06/13/2021 PCP: Henreitta Cea, MD   Brief Narrative:  This 75 years old male who presented with worsening abdominal pain associated with distention. CT abdomen showed findings concerning for primary colon cancer complicated by colonic obstruction with pneumatosis. He had an evidence of bowel perforation with small volume pneumoperitoneum.  He was evaluated by general surgery and underwent exploratory laparotomy with open colectomy and ileostomy.  Patient had intraoperative hypotension requiring pressors and mechanical ventilation.  Patient was transferred to ICU.  He had postoperative confusion and delirium and was placed on Precedex which was subsequently discontinued.  Patient was extubated and off pressers.  Patient has developed sudden jerking movements.  MRI and EEG negative. Seizures ruled out. Patient has improved medically and from surgical standpoint they feel patient can be discharged to SNF. Follow-up with Dr. Morey Hummingbird in outpatient setting.   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 Completed IV Zosyn for 5 days on 06/27/21 Continue dysphagia 3 diet. Pathology positive for invasive colorectal adenocarcinoma. Pathology reports will be presented at GI tumor board, need outpatient follow-up. Further management as per general surgery.  Repeat CT abdomen and pelvis ruled out abscess.   Chronic hypoxic  and hypercarbic respiratory failure in the context of advanced COPD and ongoing tobacco abuse. Per Pulmonary: -Limited pulmonary reserve, actually on nocturnal CPAP at baseline due to advanced lung disease and pulmonary deconditioning. Continue supplemental oxygen with nocturnal BIPAP and as needed.. Continue with scheduled bronchodilators and inhaled corticosteroids.  Acute metabolic encephalopathy: > Resolved. His delirium has resolved and he is off Precedex drip. He is back to his baseline mental status.   He does have intermittent confusion due to CO2 retention. Patient was emphasized about the importance of BiPAP use.  Atrial fibrillation: Heart rate controlled, continue metoprolol. Currently not on anticoagulation.  Essential hypertension: Continue amlodipine and metoprolol. Continue hydralazine 20 mg IV every 6 hours as needed  Anemia of chronic disease: No signs of any active bleeding.   Hemoglobin remained stable.  Sudden jerking and tremors: > Improved. MRI unremarkable.  EEG no evidence of seizures.  GERD : Continue pantoprazole.  History of prostate cancer : Stable / in remission.   DVT prophylaxis: Heparin sq Code Status: Full code Family Communication:  No family at bed side. Disposition Plan:   Status is: Inpatient  Remains inpatient appropriate because:Inpatient level of care appropriate due to severity of illness  Dispo: The patient is from: Home              Anticipated d/c is to: SNF              Patient currently is medically stable to DC   Difficult to place patient No   Consultants:  General surgery PCCM Neurology  Procedures: Exploratory laparotomy, abdominal colectomy with end ileostomy on 06/24/2021  antimicrobials:   Anti-infectives (From admission, onward)    Start     Dose/Rate Route Frequency Ordered Stop   06/25/2021 1400  piperacillin-tazobactam (ZOSYN) IVPB 3.375 g        3.375 g 12.5 mL/hr over 240 Minutes Intravenous Every 8  hours 06/08/2021 1357 06/27/21 1016   06/30/2021 0745  cefoTEtan (CEFOTAN) 2 g in sodium chloride 0.9 % 100 mL IVPB        2 g 200 mL/hr over 30 Minutes Intravenous On call to O.R. 06/21/2021 0740 07/02/2021 0831   06/25/2021 0745  sodium chloride 0.9 % with cefoTEtan (CEFOTAN) ADS Med       Note to Pharmacy: Charmayne Sheer   : cabinet override      06/21/2021 0745 07/03/2021 0854   06/27/2021 0700  piperacillin-tazobactam (ZOSYN) IVPB 3.375 g        3.375 g 100 mL/hr over 30 Minutes Intravenous  Once 06/05/2021 0653 06/17/2021 0733        Subjective: Patient was seen and examined at bedside.  Overnight events noted. Patient is lying comfortably on the bed, denies any abdominal pain. He reports having bowel movements and passing flatus through the colostomy. Heart rate controlled, blood pressure improved.  Objective: Vitals:   07/07/21 0400 07/07/21 0419 07/07/21 0500 07/07/21 0730  BP: (!) 100/46 (!) 100/46    Pulse: (!) 51 (!) 51    Resp:      Temp: 97.9 F (36.6 C)     TempSrc: Axillary     SpO2: 100% 100%  95%  Weight:   74 kg   Height:        Intake/Output Summary (Last 24 hours) at 07/07/2021 1320 Last data filed at 07/07/2021 0200 Gross per 24 hour  Intake --  Output 800 ml  Net -800 ml   Filed Weights   07/05/21 0500 07/06/21 0500 07/07/21 0500  Weight: 70.1 kg 69.7 kg 74 kg    Examination:  General exam: Appears chronically ill looking, deconditioned, not in any acute distress. Respiratory system: Clear to auscultation bilaterally, respiratory effort normal. Cardiovascular system: S1-S2 heard, regular rate and rhythm, no murmur.   Gastrointestinal system: Abdomen is soft, nontender, nondistended.  Ileostomy bag noted on the right side midline surgical scar noted with dressing.  Bowel sounds +, stool+ in appliance. Central nervous system: Alert and oriented x 3 . No focal neurological deficits. Extremities: No edema, no cyanosis, no clubbing. Skin: No rashes, lesions or  ulcers Psychiatry: Mood and affect appropriate.    Data Reviewed: I have personally reviewed following labs and imaging studies  CBC: Recent Labs  Lab 07/01/21 0257 07/02/21 0301 07/03/21 0243 07/04/21 0244 07/05/21 0306 07/06/21 0250  WBC 10.3 8.7 9.6 14.6* 16.5* 11.1*  NEUTROABS 7.7 6.4 7.1 12.5*  --   --   HGB 8.5* 8.9* 9.0* 8.8* 8.7* 8.6*  HCT 28.3* 29.5* 29.4* 28.4* 28.3* 28.0*  MCV 91.3 91.9 91.0 89.9 91.3 91.8  PLT 373 377 375 392 390 [id number]   Basic Metabolic Panel: Recent Labs  Lab 07/01/21 0257 07/02/21 0301 07/02/21 1907 07/03/21 0243 07/04/21 0244 07/05/21 0306  NA 140 138 140 138 139 139  K 4.0 4.2 4.4 4.4 4.3 4.0  CL 98 97* 99 98 98 98  CO2 36* 35* 33* 32 32 34*  GLUCOSE 98 93 122* 96 91 90  BUN '10 10 11 12 14 14  '$ CREATININE 0.64 0.65 0.68 0.75 0.76 0.76  CALCIUM 8.6* 8.9 9.1 8.9 8.9 9.1  MG 1.8 1.6*  --  2.0 1.9  --   PHOS 3.2 3.7  --  3.8 3.7  --    GFR:  Estimated Creatinine Clearance: 83.5 mL/min (by C-G formula based on SCr of 0.76 mg/dL). Liver Function Tests: Recent Labs  Lab 07/01/21 0257 07/02/21 0301 07/03/21 0243 07/04/21 0244  AST 13* 11* 11* 12*  ALT '27 21 18 16  '$ ALKPHOS 63 62 60 69  BILITOT 0.8 0.9 0.8 0.8  PROT 5.3* 5.3* 5.4* 5.4*  ALBUMIN 2.4* 2.5* 2.5* 2.6*   No results for input(s): LIPASE, AMYLASE in the last 168 hours. Recent Labs  Lab 07/03/21 1254  AMMONIA 25   Coagulation Profile: No results for input(s): INR, PROTIME in the last 168 hours. Cardiac Enzymes: No results for input(s): CKTOTAL, CKMB, CKMBINDEX, TROPONINI in the last 168 hours. BNP (last 3 results) No results for input(s): PROBNP in the last 8760 hours. HbA1C: No results for input(s): HGBA1C in the last 72 hours. CBG: Recent Labs  Lab 07/06/21 0801 07/06/21 0834 07/06/21 1118 07/06/21 1542 07/06/21 2008  GLUCAP 61* 85 142* 126* 92   Lipid Profile: No results for input(s): CHOL, HDL, LDLCALC, TRIG, CHOLHDL, LDLDIRECT in the last 72  hours. Thyroid Function Tests: No results for input(s): TSH, T4TOTAL, FREET4, T3FREE, THYROIDAB in the last 72 hours. Anemia Panel: No results for input(s): VITAMINB12, FOLATE, FERRITIN, TIBC, IRON, RETICCTPCT in the last 72 hours.  Sepsis Labs: No results for input(s): PROCALCITON, LATICACIDVEN in the last 168 hours.  Recent Results (from the past 240 hour(s))  Urine Culture     Status: None   Collection Time: 07/04/21  8:07 AM   Specimen: Urine, Catheterized  Result Value Ref Range Status   Specimen Description   Final    URINE, CATHETERIZED Performed at [facility] [address] [place], [place] [address]    Special Requests   Final    NONE Performed at [facility], [place] [address] [place], [place] [address]    Culture   Final    NO GROWTH Performed at [facility], [place] [address] [place], [place] [address]    Report Status 07/05/2021 FINAL  Final     Radiology Studies: No results found.  Scheduled Meds:  acetaminophen  650 mg Oral Q6H   amLODipine  10 mg Oral Daily   aspirin EC  81 mg Oral Daily   budesonide (PULMICORT) nebulizer solution  0.5 mg Nebulization BID   Chlorhexidine Gluconate Cloth  6 each Topical Daily   clopidogrel  75 mg Oral Daily   folic acid  1 mg Oral Daily   heparin injection (subcutaneous)  5,000 Units Subcutaneous Q8H   ibuprofen  400 mg Oral TID   ipratropium-albuterol  3 mL Nebulization BID   iron polysaccharides  150 mg Oral Daily   lip balm  1 application Topical BID   mouth rinse  15 mL Mouth Rinse BID   methocarbamol  500 mg Oral TID   metoprolol tartrate  25 mg Oral BID   multivitamin with minerals  1 tablet Oral Daily   pantoprazole  40 mg Oral Daily   thiamine  100 mg Oral Daily   Continuous Infusions:  sodium chloride Stopped (07/02/21 2203)     LOS: 15 days    Time spent: 25 mins.    Shawna Clamp, MD [facility]   If 7PM-7AM, please contact night-coverage

## 2021-07-07 NOTE — Plan of Care (Signed)
  Problem: Education: Goal: Knowledge of General Education information will improve Description: Including pain rating scale, medication(s)/side effects and non-pharmacologic comfort measures 07/07/2021 1035 by Albina Billet, RN Outcome: Progressing 07/07/2021 0839 by Albina Billet, RN Outcome: Progressing   Problem: Health Behavior/Discharge Planning: Goal: Ability to manage health-related needs will improve 07/07/2021 1035 by Albina Billet, RN Outcome: Progressing 07/07/2021 0839 by Albina Billet, RN Outcome: Progressing   Problem: Clinical Measurements: Goal: Ability to maintain clinical measurements within normal limits will improve 07/07/2021 1035 by Albina Billet, RN Outcome: Progressing 07/07/2021 0839 by Albina Billet, RN Outcome: Progressing

## 2021-07-07 NOTE — Plan of Care (Signed)
  Problem: Education: Goal: Knowledge of General Education information will improve Description: Including pain rating scale, medication(s)/side effects and non-pharmacologic comfort measures Outcome: Progressing   Problem: Clinical Measurements: Goal: Will remain free from infection Outcome: Progressing   Problem: Nutrition: Goal: Adequate nutrition will be maintained Outcome: Progressing   

## 2021-07-08 ENCOUNTER — Inpatient Hospital Stay (HOSPITAL_COMMUNITY): Payer: No Typology Code available for payment source

## 2021-07-08 DIAGNOSIS — Z66 Do not resuscitate: Secondary | ICD-10-CM | POA: Diagnosis not present

## 2021-07-08 DIAGNOSIS — N200 Calculus of kidney: Secondary | ICD-10-CM

## 2021-07-08 DIAGNOSIS — K55049 Acute infarction of large intestine, extent unspecified: Secondary | ICD-10-CM | POA: Diagnosis not present

## 2021-07-08 DIAGNOSIS — Z932 Ileostomy status: Secondary | ICD-10-CM

## 2021-07-08 LAB — BLOOD GAS, ARTERIAL
Acid-base deficit: 0.7 mmol/L (ref 0.0–2.0)
Bicarbonate: 28.9 mmol/L — ABNORMAL HIGH (ref 20.0–28.0)
Drawn by: 25788
FIO2: 30
O2 Saturation: 92.9 %
Patient temperature: 98.6
pCO2 arterial: 86.2 mmHg (ref 32.0–48.0)
pH, Arterial: 7.152 — CL (ref 7.350–7.450)
pO2, Arterial: 77.5 mmHg — ABNORMAL LOW (ref 83.0–108.0)

## 2021-07-08 LAB — URINALYSIS, ROUTINE W REFLEX MICROSCOPIC
Bacteria, UA: NONE SEEN
Bilirubin Urine: NEGATIVE
Glucose, UA: NEGATIVE mg/dL
Ketones, ur: NEGATIVE mg/dL
Nitrite: NEGATIVE
Protein, ur: 30 mg/dL — AB
RBC / HPF: >50 RBC/hpf — ABNORMAL HIGH (ref 0–5)
Specific Gravity, Urine: 1.025 (ref 1.005–1.030)
WBC, UA: >50 WBC/hpf — ABNORMAL HIGH (ref 0–5)
pH: 5.5 (ref 5.0–8.0)

## 2021-07-08 LAB — COMPREHENSIVE METABOLIC PANEL
ALT: 14 U/L (ref 0–44)
AST: 13 U/L — ABNORMAL LOW (ref 15–41)
Albumin: 2.4 g/dL — ABNORMAL LOW (ref 3.5–5.0)
Alkaline Phosphatase: 92 U/L (ref 38–126)
Anion gap: 9 (ref 5–15)
BUN: 21 mg/dL (ref 8–23)
CO2: 30 mmol/L (ref 22–32)
Calcium: 8.6 mg/dL — ABNORMAL LOW (ref 8.9–10.3)
Chloride: 90 mmol/L — ABNORMAL LOW (ref 98–111)
Creatinine, Ser: 1.45 mg/dL — ABNORMAL HIGH (ref 0.61–1.24)
GFR, Estimated: 50 mL/min — ABNORMAL LOW (ref >60)
Glucose, Bld: 111 mg/dL — ABNORMAL HIGH (ref 70–99)
Potassium: 4.6 mmol/L (ref 3.5–5.1)
Sodium: 129 mmol/L — ABNORMAL LOW (ref 135–145)
Total Bilirubin: 0.5 mg/dL (ref 0.3–1.2)
Total Protein: 5.4 g/dL — ABNORMAL LOW (ref 6.5–8.1)

## 2021-07-08 LAB — CBC
HCT: 30.8 % — ABNORMAL LOW (ref 39.0–52.0)
Hemoglobin: 9 g/dL — ABNORMAL LOW (ref 13.0–17.0)
MCH: 28.2 pg (ref 26.0–34.0)
MCHC: 29.2 g/dL — ABNORMAL LOW (ref 30.0–36.0)
MCV: 96.6 fL (ref 80.0–100.0)
Platelets: 365 10*3/uL (ref 150–400)
RBC: 3.19 MIL/uL — ABNORMAL LOW (ref 4.22–5.81)
RDW: 19.4 % — ABNORMAL HIGH (ref 11.5–15.5)
WBC: 17.9 10*3/uL — ABNORMAL HIGH (ref 4.0–10.5)
nRBC: 0.2 % (ref 0.0–0.2)

## 2021-07-08 LAB — BASIC METABOLIC PANEL
Anion gap: 10 (ref 5–15)
BUN: 26 mg/dL — ABNORMAL HIGH (ref 8–23)
CO2: 29 mmol/L (ref 22–32)
Calcium: 8.6 mg/dL — ABNORMAL LOW (ref 8.9–10.3)
Chloride: 92 mmol/L — ABNORMAL LOW (ref 98–111)
Creatinine, Ser: 1.8 mg/dL — ABNORMAL HIGH (ref 0.61–1.24)
GFR, Estimated: 39 mL/min — ABNORMAL LOW (ref >60)
Glucose, Bld: 82 mg/dL (ref 70–99)
Potassium: 5.2 mmol/L — ABNORMAL HIGH (ref 3.5–5.1)
Sodium: 131 mmol/L — ABNORMAL LOW (ref 135–145)

## 2021-07-08 LAB — TYPE AND SCREEN
ABO/RH(D): A POS
Antibody Screen: NEGATIVE

## 2021-07-08 LAB — GLUCOSE, CAPILLARY
Glucose-Capillary: 103 mg/dL — ABNORMAL HIGH (ref 70–99)
Glucose-Capillary: 79 mg/dL (ref 70–99)

## 2021-07-08 LAB — LACTIC ACID, PLASMA: Lactic Acid, Venous: 0.8 mmol/L (ref 0.5–1.9)

## 2021-07-08 MED ORDER — VANCOMYCIN HCL IN DEXTROSE 1-5 GM/200ML-% IV SOLN
1000.0000 mg | INTRAVENOUS | Status: DC
  Filled 2021-07-08: qty 200

## 2021-07-08 MED ORDER — CEFEPIME HCL 2 G IJ SOLR
2.0000 g | Freq: Two times a day (BID) | INTRAMUSCULAR | Status: DC
  Administered 2021-07-08 – 2021-07-10 (×5): 2 g via INTRAVENOUS
  Filled 2021-07-08 (×5): qty 2

## 2021-07-08 MED ORDER — ORAL CARE MOUTH RINSE
15.0000 mL | Freq: Two times a day (BID) | OROMUCOSAL | Status: DC
  Administered 2021-07-08 – 2021-07-10 (×3): 15 mL via OROMUCOSAL

## 2021-07-08 MED ORDER — DEXTROSE 5 % IV SOLN
2.0000 g | Freq: Two times a day (BID) | INTRAVENOUS | Status: DC
  Filled 2021-07-08: qty 2

## 2021-07-08 MED ORDER — LACTATED RINGERS IV BOLUS
1000.0000 mL | Freq: Once | INTRAVENOUS | Status: AC
  Administered 2021-07-08: 1000 mL via INTRAVENOUS

## 2021-07-08 MED ORDER — SODIUM CHLORIDE 0.9 % IV BOLUS
500.0000 mL | Freq: Once | INTRAVENOUS | Status: AC
  Administered 2021-07-08: 500 mL via INTRAVENOUS

## 2021-07-08 MED ORDER — VANCOMYCIN HCL 1500 MG/300ML IV SOLN
1500.0000 mg | Freq: Once | INTRAVENOUS | Status: AC
  Administered 2021-07-08: 1500 mg via INTRAVENOUS
  Filled 2021-07-08: qty 300

## 2021-07-08 MED ORDER — NOREPINEPHRINE 4 MG/250ML-% IV SOLN
2.0000 ug/min | INTRAVENOUS | Status: DC
Start: 2021-07-08 — End: 2021-07-10
  Administered 2021-07-08: 10 ug/min via INTRAVENOUS
  Administered 2021-07-08: 2 ug/min via INTRAVENOUS
  Administered 2021-07-09: 6 ug/min via INTRAVENOUS
  Administered 2021-07-09: 5 ug/min via INTRAVENOUS
  Filled 2021-07-08: qty 500
  Filled 2021-07-08 (×2): qty 250

## 2021-07-08 MED ORDER — SODIUM CHLORIDE 0.9 % IV SOLN
250.0000 mL | INTRAVENOUS | Status: DC
  Administered 2021-07-08: 250 mL via INTRAVENOUS

## 2021-07-08 MED ORDER — NOREPINEPHRINE 4 MG/250ML-% IV SOLN
0.0000 ug/min | INTRAVENOUS | Status: DC
  Filled 2021-07-08: qty 250

## 2021-07-08 NOTE — Progress Notes (Addendum)
Paged by bedside RN with concerns for hypotension and worsening fatigue/weakness, On assessment, pt is lethargic. Rhonchi and wheezing heard throughout. SATs are 100% on 4L [place]. Pt is also bradycardiac with HR in the 50's. He is hypotensive with SBP 60-70's. According to bedside RN he has not urinated for this shift and his health seems to be rapidly declining. Pt unable to tolerate fluid boluses at this time as he appears to be fluid overload. We will move pt to ICU/Stepdown and start on levo. PCCM to be consulted. CBC, LA, Chest xray, CMP ordered.   Pt BP is now stable with SBP 110's. Will still transfer pt to SDU for closer monitoring. Hold off on levo and PCCM consult for now.   Lovey Newcomer, NP  [facility] 7p-7a [phone]

## 2021-07-08 NOTE — Progress Notes (Signed)
PROGRESS NOTE    Daniel King  [id number] DOB: 05/23/1946 DOA: 06/26/2021 PCP: Henreitta Cea, MD   Brief Narrative:  This 75 years old male who presented with worsening abdominal pain associated with distention. CT abdomen showed findings concerning for primary colon cancer complicated by colonic obstruction with pneumatosis. He had an evidence of bowel perforation with small volume pneumoperitoneum.  He was evaluated by general surgery and underwent exploratory laparotomy with open colectomy and ileostomy.  Patient had intraoperative hypotension requiring pressors and mechanical ventilation.  Patient was transferred to ICU. He had postoperative confusion and delirium and was placed on Precedex which was subsequently discontinued.  Patient was extubated and off pressers.  Patient has developed sudden jerking movements.  MRI and EEG negative. Seizures ruled out. Patient has improved medically and from surgical standpoint they feel patient can be discharged to SNF. 9/4: Overnight patient was hypotensive, bradycardic and hypothermic, requiring pressor support, Patient was transferred to ICU and given IV fluids and antibiotics.   Assessment & Plan:   Principal Problem:   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 06/09/2021 Active Problems:   Perforated bowel ([place])   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HCC)   History of stroke   Supplemental oxygen dependent   COPD (chronic obstructive pulmonary disease) (HCC)   Malnutrition of moderate degree   Acute respiratory failure with hypoxia (HCC)   Rectosigmoid cancer (HCC)  Acute bowel perforation with pneumatosis:  Bowel obstruction due to T4N1 obstructing rectosigmoid cancer: Now S/p open colectomy with ileostomy. Completed IV Zosyn for 5 days on 06/27/21 Continue dysphagia 3 diet. Pathology positive for invasive colorectal adenocarcinoma. Pathology reports will be presented at GI tumor board, need outpatient  follow-up. Further management as per general surgery.  Repeat CT abdomen and pelvis ruled out abscess.  Severe sepsis possibly secondary to intra-abdominal abscess Patient became hypotensive, bradycardic and hypothermic requiring pressor support. Serum creatinine up from 0.76 >1.45, lactic acid 0.8,  confusion. Follow blood cultures, started on vancomycin and cefepime. Continue IV antibiotic and follow-up clinical course. Obtain CT scan abdomen to rule out abscess if continue to remain hypertensive.  Chronic hypoxic and hypercarbic respiratory failure in the context of advanced COPD and ongoing tobacco abuse. Per Pulmonary: -Limited pulmonary reserve, actually on nocturnal CPAP at baseline due to advanced lung disease and pulmonary deconditioning. Continue supplemental oxygen with nocturnal BIPAP and as needed.. Continue with scheduled bronchodilators and inhaled corticosteroids.  Acute metabolic encephalopathy:  His delirium has resolved and he is off Precedex drip. He is back to his baseline mental status.   He does have intermittent confusion due to CO2 retention. Patient was emphasized about the importance of BiPAP use.   Atrial fibrillation: Heart rate controlled, continue metoprolol. Currently not on anticoagulation.  Essential hypertension: Hold  amlodipine and metoprolol. Hold hydralazine 20 mg IV every 6 hours as needed  Anemia of chronic disease: No signs of any active bleeding.   Hemoglobin remained stable.  Sudden jerking and tremors: > Improved. MRI unremarkable.  EEG no evidence of seizures.  GERD : Continue pantoprazole.  History of prostate cancer : Stable / in remission.   DVT prophylaxis: Heparin sq Code Status: Full code Family Communication:  No family at bed side. Disposition Plan:   Status is: Inpatient  Remains inpatient appropriate because:Inpatient level of care appropriate due to severity of illness  Dispo: The patient is from: Home               Anticipated d/c is to: SNF  Patient currently is medically stable to DC   Difficult to place patient No   Consultants:  General surgery PCCM Neurology  Procedures: Exploratory laparotomy, abdominal colectomy with end ileostomy on 06/20/2021  antimicrobials:   Anti-infectives (From admission, onward)    Start     Dose/Rate Route Frequency Ordered Stop   07/09/21 1000  vancomycin (VANCOCIN) IVPB 1000 mg/200 mL premix        1,000 mg 200 mL/hr over 60 Minutes Intravenous Every 24 hours 07/08/21 0859     07/08/21 1000  ceFEPIme (MAXIPIME) 2 g in dextrose 5 % 50 mL IVPB  Status:  Discontinued        2 g 100 mL/hr over 30 Minutes Intravenous Every 12 hours 07/08/21 0816 07/08/21 0824   07/08/21 1000  ceFEPIme (MAXIPIME) 2 g in sodium chloride 0.9 % 100 mL IVPB        2 g 200 mL/hr over 30 Minutes Intravenous Every 12 hours 07/08/21 0824     07/08/21 1000  vancomycin (VANCOREADY) IVPB 1500 mg/300 mL        1,500 mg 150 mL/hr over 120 Minutes Intravenous  Once 07/08/21 0859     07/02/2021 1400  piperacillin-tazobactam (ZOSYN) IVPB 3.375 g        3.375 g 12.5 mL/hr over 240 Minutes Intravenous Every 8 hours 06/28/2021 1357 06/27/21 1016   06/13/2021 0745  cefoTEtan (CEFOTAN) 2 g in sodium chloride 0.9 % 100 mL IVPB        2 g 200 mL/hr over 30 Minutes Intravenous On call to O.R. 06/19/2021 0740 06/10/2021 0831   07/01/2021 0745  sodium chloride 0.9 % with cefoTEtan (CEFOTAN) ADS Med       Note to Pharmacy: Charmayne Sheer   : cabinet override      06/07/2021 0745 06/28/2021 0854   06/11/2021 0700  piperacillin-tazobactam (ZOSYN) IVPB 3.375 g        3.375 g 100 mL/hr over 30 Minutes Intravenous  Once 06/06/2021 0653 06/14/2021 0733        Subjective: Patient was seen and examined at bedside.  Overnight events noted. Patient was hypotensive, hypothermic and bradycardic,  He was moved to ICU and started on Levophed. He seems arousable , appears tired.  Heart rate  and  blood pressure  now  improved.  Objective: Vitals:   07/08/21 0432 07/08/21 0500 07/08/21 0728 07/08/21 0803  BP: (!) 82/45  (!) 102/31   Pulse: (!) 51  81   Resp: 20  15   Temp: 97.9 F (36.6 C)     TempSrc: Oral     SpO2: 100%  97% 100%  Weight:  73.3 kg    Height:        Intake/Output Summary (Last 24 hours) at 07/08/2021 1040 Last data filed at 07/08/2021 0600 Gross per 24 hour  Intake 178.89 ml  Output 400 ml  Net -221.11 ml   Filed Weights   07/06/21 0500 07/07/21 0500 07/08/21 0500  Weight: 69.7 kg 74 kg 73.3 kg    Examination:  General exam: Appears chronically ill looking, deconditioned, not in any acute distress. Respiratory system: Clear to auscultation bilaterally, respiratory effort normal.  RR 15  Cardiovascular system: S1-S2 heard, regular rate and rhythm, no murmur.   Gastrointestinal system: Abdomen is soft, nontender, nondistended.  Ileostomy bag noted on the right side midline surgical scar noted with dressing.  Bowel sounds +, stool+ in appliance. Central nervous system: Alert, arousable, tired.. No focal neurological deficits. Extremities: No edema,  no cyanosis, no clubbing. Skin: No rashes, lesions or ulcers Psychiatry: Mood and affect appropriate.    Data Reviewed: I have personally reviewed following labs and imaging studies  CBC: Recent Labs  Lab 07/02/21 0301 07/03/21 0243 07/04/21 0244 07/05/21 0306 07/06/21 0250 07/08/21 0440  WBC 8.7 9.6 14.6* 16.5* 11.1* 17.9*  NEUTROABS 6.4 7.1 12.5*  --   --   --   HGB 8.9* 9.0* 8.8* 8.7* 8.6* 9.0*  HCT 29.5* 29.4* 28.4* 28.3* 28.0* 30.8*  MCV 91.9 91.0 89.9 91.3 91.8 96.6  PLT 377 375 392 390 368 [id number]   Basic Metabolic Panel: Recent Labs  Lab 07/02/21 0301 07/02/21 1907 07/03/21 0243 07/04/21 0244 07/05/21 0306 07/08/21 0447  NA 138 140 138 139 139 129*  K 4.2 4.4 4.4 4.3 4.0 4.6  CL 97* 99 98 98 98 90*  CO2 35* 33* 32 32 34* 30  GLUCOSE 93 122* 96 91 90 111*  BUN '10 11 12 14 14 21  '$ CREATININE 0.65  0.68 0.75 0.76 0.76 1.45*  CALCIUM 8.9 9.1 8.9 8.9 9.1 8.6*  MG 1.6*  --  2.0 1.9  --   --   PHOS 3.7  --  3.8 3.7  --   --    GFR: Estimated Creatinine Clearance: 45.6 mL/min (A) (by C-G formula based on SCr of 1.45 mg/dL (H)). Liver Function Tests: Recent Labs  Lab 07/02/21 0301 07/03/21 0243 07/04/21 0244 07/08/21 0447  AST 11* 11* 12* 13*  ALT '21 18 16 14  '$ ALKPHOS 62 60 69 92  BILITOT 0.9 0.8 0.8 0.5  PROT 5.3* 5.4* 5.4* 5.4*  ALBUMIN 2.5* 2.5* 2.6* 2.4*   No results for input(s): LIPASE, AMYLASE in the last 168 hours. Recent Labs  Lab 07/03/21 1254  AMMONIA 25   Coagulation Profile: No results for input(s): INR, PROTIME in the last 168 hours. Cardiac Enzymes: No results for input(s): CKTOTAL, CKMB, CKMBINDEX, TROPONINI in the last 168 hours. BNP (last 3 results) No results for input(s): PROBNP in the last 8760 hours. HbA1C: No results for input(s): HGBA1C in the last 72 hours. CBG: Recent Labs  Lab 07/06/21 0834 07/06/21 1118 07/06/21 1542 07/06/21 2008 07/08/21 0442  GLUCAP 85 142* 126* 92 103*   Lipid Profile: No results for input(s): CHOL, HDL, LDLCALC, TRIG, CHOLHDL, LDLDIRECT in the last 72 hours. Thyroid Function Tests: No results for input(s): TSH, T4TOTAL, FREET4, T3FREE, THYROIDAB in the last 72 hours. Anemia Panel: No results for input(s): VITAMINB12, FOLATE, FERRITIN, TIBC, IRON, RETICCTPCT in the last 72 hours.  Sepsis Labs: Recent Labs  Lab 07/08/21 0503  LATICACIDVEN 0.8    Recent Results (from the past 240 hour(s))  Urine Culture     Status: None   Collection Time: 07/04/21  8:07 AM   Specimen: Urine, Catheterized  Result Value Ref Range Status   Specimen Description   Final    URINE, CATHETERIZED Performed at [facility] [address], [place], [place] [address]    Special Requests   Final    NONE Performed at [facility], [place] [address] [place], [place] [address]    Culture   Final     NO GROWTH Performed at [facility], [place] [address] [place], [place] [address]    Report Status 07/05/2021 FINAL  Final     Radiology Studies: DG Chest 1 View  Result Date: 07/08/2021 CLINICAL DATA:  Dyspnea EXAM: CHEST  1 VIEW COMPARISON:  07/04/2021 FINDINGS: Hazy  appearance of the bilateral chest with layering pleural effusions. Normal heart size and unremarkable mediastinal contours for rotation. Remote right clavicle fracture with nonunion IMPRESSION: Pleural effusions and lower lobe atelectasis based on abdominal CT 3 days ago. No significant change from radiograph 07/04/2021. Electronically Signed   By: Monte Fantasia M.D.   On: 07/08/2021 05:26    Scheduled Meds:  acetaminophen  650 mg Oral Q6H   aspirin EC  81 mg Oral Daily   budesonide (PULMICORT) nebulizer solution  0.5 mg Nebulization BID   Chlorhexidine Gluconate Cloth  6 each Topical Daily   clopidogrel  75 mg Oral Daily   folic acid  1 mg Oral Daily   heparin injection (subcutaneous)  5,000 Units Subcutaneous Q8H   ibuprofen  400 mg Oral TID   ipratropium-albuterol  3 mL Nebulization BID   iron polysaccharides  150 mg Oral Daily   lip balm  1 application Topical BID   mouth rinse  15 mL Mouth Rinse BID   methocarbamol  500 mg Oral TID   multivitamin with minerals  1 tablet Oral Daily   pantoprazole  40 mg Oral Daily   thiamine  100 mg Oral Daily   Continuous Infusions:  sodium chloride Stopped (07/02/21 2203)   sodium chloride 250 mL (07/08/21 1028)   ceFEPime (MAXIPIME) IV 2 g (07/08/21 1033)   norepinephrine (LEVOPHED) Adult infusion 2 mcg/min (07/08/21 0914)   [START ON 07/09/2021] vancomycin     vancomycin       LOS: 16 days    Time spent: 35 mins.    Shawna Clamp, MD [facility]   If 7PM-7AM, please contact night-coverage

## 2021-07-08 NOTE — Plan of Care (Signed)
  Problem: Clinical Measurements: Goal: Will remain free from infection Outcome: Progressing Goal: Diagnostic test results will improve Outcome: Progressing Goal: Respiratory complications will improve Outcome: Progressing   Problem: Nutrition: Goal: Adequate nutrition will be maintained Outcome: Not Progressing

## 2021-07-08 NOTE — Progress Notes (Signed)
Pt became more lethargic. Responsive to pain. poor attention/concentration. VSS. MD H[place] to bedside. ABG ordered.

## 2021-07-08 NOTE — Progress Notes (Signed)
Daniel King [id number] 30-Dec-1945  CARE TEAM:  PCP: Daniel Cea, MD  Outpatient Care Team: Patient Care Team: Daniel Cea, MD as PCP - General (Family Medicine) Daniel Boston, MD as Consulting Physician (General Surgery)  Inpatient Treatment Team: Treatment Team: Attending Provider: Tawni Millers, MD; Consulting Physician: Daniel Pace, Md, MD; Consulting Physician: Daniel Clam, MD; Rounding Team: Daniel Garibaldi, MD; Consulting Physician: Daniel Millers, MD; Charge Nurse: Collene Gobble, RN; Rounding Team: Pccm, Md, MD; Utilization Review: Conception Oms, RN; Registered Nurse: Jenean Lindau, RN; Janeece Riggers: Simeon Craft; Registered Nurse: Valorie Roosevelt, RN; Pharmacist: Royetta Asal, [facility]   Problem List:   Principal Problem:   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 06/18/2021 Active Problems:   Perforated bowel ([place])   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  Cecum and ascending colon with ischemia and patches of phlegmon and gangrene.  Microperforation.  No major feculent contamination.  Rocky hard rectosigmoid mass without any major inflammation.  Suspicious for malignancy.  Abdominal colectomy with end ileostomy done.  Prolene suture at rectal stump.  Oozy tissues since fully anticoagulated Plavix but hemostasis insured.  SURGICAL PATHOLOGY   * THIS IS AN ADDENDUM REPORT *  CASE: [id number]  PATIENT: Daniel King  Surgical Pathology Report  *Addendum *   Reason for Addendum #1:  DNA Mismatch Repair IHC Results   Clinical History: Perforated bowel ([facility])      FINAL MICROSCOPIC DIAGNOSIS:   A. COLON, RECTO-SIGMOID STRICTURE AND OMENTUM, RESECTION:  - Invasive colorectal adenocarcinoma, 4 cm.  - Focal changes consistent with serosal involvement, see comment.  - Margins not involved.  - Metastatic carcinoma in one of thirty-six lymph nodes (1/36).  - Two satellite tumor nodules.  - Four separate tubular adenomas.  - Benign appendix.  - See oncology table and comment.   B. PERITONEAL MASS, EXCISION:  - Fibrotic and partially calcified nodule consistent with infarcted  appendices epiploica.  - No evidence of malignancy.   ONCOLOGY TABLE:  COLON AND RECTUM, CARCINOMA:  Resection  Procedure: Abdominal colectomy.  Tumor Site: Sigmoid.  Tumor Size: 4 x 4 cm.  Macroscopic Tumor Perforation: Not identified.  Histologic Type: Colorectal adenocarcinoma.  Histologic Grade: G2, moderately differentiated.  Multiple Primary Sites: Not applicable.  Tumor Extension: Into pericolonic connective tissue and focal serosal  involvement, see comment.  Lymphovascular Invasion: Present.  Perineural Invasion: Not identified.  Treatment Effect: No known presurgical therapy.  Margins: All surgical margins negative for carcinoma.  Regional Lymph Nodes:       Number of Lymph Nodes with Tumor: 1       Number of Lymph Nodes Examined: 36  Tumor Deposits: 2  Pathologic Stage Classification (pTNM, AJCC 8th Edition): pT4a, pN1a  Ancillary Studies: MMR and MSI testing have been ordered and will be  reported separately.  Representative Tumor Block: A4-A7  Comments: The carcinoma extends into the subserosal connective tissue  and the adjacent serosa shows extensive fibroinflammatory reaction and  the findings  are most consistent with focal serosal involvement.  (v4.2.0.1)   [name] DESCRIPTION:  Specimen  A: Subtotal colectomy, to include right colon with cecum and  appendix, portion of terminal ileum, and clinically includes  rectosigmoid, with separate portion of omentum, received fresh.  Specimen integrity: The specimen is unopened.  No obvious defects.  (See  description under heading additional findings).  Specimen length: There are 6 cm of terminal ileum, and colon is 137 cm  from cecum to distal margin.  The right colon is dilated up to 12 cm and  filled with green-brown fluid/soft material.  f intactness: Minimal included mesorectum.  Tumor location: Sigmoid  Tumor size: 4 cm in length and 4 cm in width tan-pink firm sessile mass.  Percent of bowel circumference involved: 100%, stenosing the lumen to  less than 1 cm.  Tumor distance to margins:                       Proximal: 130 cm                       Distal: 8 cm                       Mesenteric (sigmoid and transverse): 1.7 cm  Macroscopic extent of tumor invasion: The mass involves full-thickness  of the muscularis, and involves underlying fat.  The mass also abuts  serosa with possible serosal involvement.  Total presumed lymph nodes: Found are 37 possible lymph nodes which  range from 0.3 to 1 cm.  Extramural satellite tumor nodules: There is a 1.3 cm possible satellite  tumor nodule 0.8 cm from the mass.  Mucosal polyp(s): There are 4 tan-pink polyps scattered throughout the  colon, ranging from 0.5 to 0.8 cm.  Additional findings: The external surface of the right colon has diffuse  areas of gray-green to green-brown discoloration.  No obvious defects  are identified within the colon.  The appendix is 4.3 cm in length, up  to 0.9 cm in diameter, has a tan-pink to pink-red serosa with few  scattered adhesions, and cut surfaces are unremarkable.  Omentum: The included portion of omentum has unremarkable cut surfaces.  Block summary:  Block 1 = proximal margin  Block 2 = distal margin  Block 3 = tumor involving fat   Blocks 4, 5 = tumor and possible serosal involvement  Block 6 = tumor and adjacent mucosa  Block 7 = tissue for molecular testing  Block 8 = possible extramural satellite tumor nodule  Block 9 = 4 polyps  Block 10 = appendix  Block 11 = 4 possible nodes  Block 12 = 4 possible nodes  Block 13 = 4 possible nodes  Block 14 = 4 possible nodes  Block 15 = 4 possible nodes  Block 16 = 4 possible nodes  Block 17 = 4 possible nodes  Block 18 = 4 possible nodes  Block 19 = 5 possible nodes   Specimen B: Received fresh is a 0.8 cm firm pale yellow well-defined  nodule which has a smooth external surface on sectioning is cystic,  containing yellow-brown soft material.  There is a possible calcified  rim.  The specimen is sectioned and entirely submitted in 1 block.  SW 06/24/2021   Final Diagnosis performed by Claudette Laws, MD.   Electronically signed  06/25/2021  Technical and / or Professional components performed at [facility], [place]  [address] [place], [place] [address].   Immunohistochemistry Technical component (if applicable) was performed  at [facility]. [address], [place],  [place], [place] [address].   IMMUNOHISTOCHEMISTRY DISCLAIMER (if applicable):  Some of these immunohistochemical stains may have been developed and the  performance characteristics determine by [facility]. Some  may not have been cleared or approved by the U.S. Food and Drug  Administration. The FDA has determined that such clearance or approval  is not necessary. This test is used for clinical purposes. It should not  be regarded as investigational or for research. This laboratory is  certified under the [place]  (CLIA-88) as qualified to perform high complexity clinical laboratory  testing.  The controls stained appropriately.     ADDENDUM:    Mismatch Repair Protein (IHC)   SUMMARY INTERPRETATION:  NORMAL   There is preserved expression of the major MMR proteins. There is a very  low probability that microsatellite instability (MSI) is present.  However, certain clinically significant MMR protein mutations may result  in preservation of nuclear expression. It is recommended that the  preservation of protein expression be correlated with molecular based  MSI testing.   IHC EXPRESSION RESULTS   TEST           RESULT  MLH1:          Preserved nuclear expression  MSH2:          Preserved nuclear expression  MSH6:          Preserved nuclear expression  PMS2:          Preserved nuclear expression   References:  1. Guidelines on Genetic Evaluation and Management of Lynch Syndrome: A  Consensus Statement by the [place] Multi-Society Task Force on Colorectal  Cancer Gae Dry. Sherlie Ban , MD, and other . Am Nicki [place] 2014;  [phone]; doi: 10.1038/ajg.2014.186; published online 25 May 2013  2. Outcomes of screening endometrial cancer patients for Lynch syndrome  by patient-administered checklist. Olena Heckle MS, and others. Gynecol Oncol  2013;131(3):619-623.  3. Muir-Torre syndrome (MTS): An update and approach to diagnosis and  management. Shelly Flatten, MD and others. J Am Acad Dermatol  [phone]   Addendum #1 performed by Jaquita Folds, MD.   Electronically signed  06/26/2021  Technical and / or Professional components performed at [facility], [place] [address], [place], [place] [address].   Immunohistochemistry Technical component (if applicable) was performed  at [facility]. [address] [place],  [place], [place] [address].   IMMUNOHISTOCHEMISTRY DISCLAIMER (if applicable):  Some of these immunohistochemical stains may have been developed and the  performance characteristics determine by [facility]. Some  may not have been cleared or approved by the U.S. Food and Drug  Administration. The FDA has determined  that such clearance or approval  is not necessary. This test is used for clinical purposes. It should not  be regarded as investigational or for research. This laboratory is  certified under the [place]  (CLIA-88) as qualified to perform high complexity clinical laboratory  testing.  The controls stained appropriately.   Assessment  Worsening confusion suspicious for recurrent encephalopathy in the setting of oxygen pendant COPD requiring emergent surgery for stage III obstructing rectal cancer and colon perforation necrosis  [facility] Stay = 16 days)  Plan:  -Discussed with Dr. Minda Meo with critical care at bedside.  Wife and ICU team  at bedside as well.  Try to wean oxygen back down since he is 100% on 4 L.  He most likely has hypoxic respiratory drive since he is on chronic oxygen at home.  Consider ABG.  Follow mental status.  Looks tired exhausted but not massively confused.  Hyponatremia per critical care/medicine.  Increasing creatinine most likely a sign of dehydration.  Challenge in this patient with an end ileostomy and oxygen pendant COPD.  I am skeptical that he has any intra-abdominal abscess or pathology since he is not tender, wound is clean, ileostomy is functioning, no evidence of emesis.  But if he does not improve, can consider CT scan to rule out any intra-abdominal process.  Consider wound VAC for now to help with midline wound granulate.  May need to go to packing once he transfers to skilled facility.  Suspect that is not going to happen for a few days.  -VTE prophylaxis- SCDs, etc  -mobilize as tolerated to help recovery  Disposition:  Disposition:  The patient is from: Home  Anticipate discharge to:  [facility] (SNF)  Anticipated Date of Discharge is:  September 7,2022    Barriers to discharge:  Pending Clinical improvement (more likely than not)  Patient currently is NOT MEDICALLY STABLE  for discharge from the hospital from a surgery standpoint.      25 minutes spent in review, evaluation, examination, counseling, and coordination of care.   I have reviewed this patient's available data, including medical history, events of note, physical examination and test results as part of my evaluation.  A significant portion of that time was spent in counseling.  Care during the described time interval was provided by me.  07/08/2021    Subjective: (Chief complaint)  Some hypotension and confusion in the middle the night.  Medicine transferred patient to ICU.  Wife at bedside with critical care nursing as well as myself and critical care attending, Dr. Minda Meo.  Patient sleepy and tired.  Will answer some commands.  No episodes of emesis.  Objective:  Vital signs:  Vitals:   07/07/21 2320 07/08/21 0432 07/08/21 0500 07/08/21 0803  BP:  (!) 82/45    Pulse: (!) 57 (!) 51    Resp:  20    Temp:  97.9 F (36.6 C)    TempSrc:  Oral    SpO2: 97% 100%  100%  Weight:   73.3 kg   Height:        Last BM Date: 07/07/21  Intake/Output   Yesterday:  09/03 0701 - 09/04 0700 In: 178.9 [P.O.:120; IV Piggyback:58.9] Out: 400 [Stool:400] This shift:  No intake/output data recorded.  Bowel function:  Flatus: YES  BM:  YES  Drain: (No drain)   Physical Exam:  General: Pt awake/alert in moderate acute distress.  Tired and uncomfortable.  Does awaken to follow some commands Eyes: PERRL, normal EOM.  Sclera clear.  No icterus Neuro: CN II-XII intact w/o focal sensory/motor deficits. Lymph: No head/neck/groin lymphadenopathy Psych:  No delerium/psychosis/paranoia.  Oriented x 2 HENT: Normocephalic, Mucus membranes moist.  No thrush Neck: Supple, No tracheal deviation.  No obvious thyromegaly Chest: No pain to chest wall compression.  Good respiratory excursion.  No audible wheezing CV:  Pulses intact.  Regular rhythm.  No major extremity edema MS: Normal AROM mjr  joints.  No obvious deformity  Abdomen: Soft.  Mildy distended.  Mildly tender at incisions only.  No evidence of peritonitis.  No incarcerated hernias. Right-sided ileostomy pink with  no edema.  Gas and succus in bag.  Midline incision with some granulation.  No dehiscence or purulence.  Ext:   No deformity.  No mjr edema.  No cyanosis Skin: No petechiae / purpurea.  No major sores.  Warm and dry    Results:   Cultures: Recent Results (from the past 720 hour(s))  Resp Panel by RT-PCR (Flu A&B, Covid) Nasopharyngeal Swab     Status: None   Collection Time: 06/05/2021  6:37 AM   Specimen: Nasopharyngeal Swab; Nasopharyngeal(NP) swabs in vial transport medium  Result Value Ref Range Status   SARS Coronavirus 2 by RT PCR NEGATIVE NEGATIVE Final    Comment: (NOTE) SARS-CoV-2 target nucleic acids are NOT DETECTED.  The SARS-CoV-2 RNA is generally detectable in upper respiratory specimens during the acute phase of infection. The lowest concentration of SARS-CoV-2 viral copies this assay can detect is 138 copies/mL. A negative result does not preclude SARS-Cov-2 infection and should not be used as the sole basis for treatment or other patient management decisions. A negative result may occur with  improper specimen collection/handling, submission of specimen other than nasopharyngeal swab, presence of viral mutation(s) within the areas targeted by this assay, and inadequate number of viral copies(<138 copies/mL). A negative result must be combined with clinical observations, patient history, and epidemiological information. The expected result is Negative.  Fact Sheet for Patients:  EntrepreneurPulse.com.au  Fact Sheet for Healthcare Providers:  IncredibleEmployment.be  This test is no t yet approved or cleared by the [place] FDA and  has been authorized for detection and/or diagnosis of SARS-CoV-2 by FDA under an Emergency Use  Authorization (EUA). This EUA will remain  in effect (meaning this test can be used) for the duration of the COVID-19 declaration under Section 564(b)(1) of the Act, 21 U.S.C.section 360bbb-3(b)(1), unless the authorization is terminated  or revoked sooner.       Influenza A by PCR NEGATIVE NEGATIVE Final   Influenza B by PCR NEGATIVE NEGATIVE Final    Comment: (NOTE) The Xpert Xpress SARS-CoV-2/FLU/RSV plus assay is intended as an aid in the diagnosis of influenza from Nasopharyngeal swab specimens and should not be used as a sole basis for treatment. Nasal washings and aspirates are unacceptable for Xpert Xpress SARS-CoV-2/FLU/RSV testing.  Fact Sheet for Patients: EntrepreneurPulse.com.au  Fact Sheet for Healthcare Providers: IncredibleEmployment.be  This test is not yet approved or cleared by the [place] FDA and has been authorized for detection and/or diagnosis of SARS-CoV-2 by FDA under an Emergency Use Authorization (EUA). This EUA will remain in effect (meaning this test can be used) for the duration of the COVID-19 declaration under Section 564(b)(1) of the Act, 21 U.S.C. section 360bbb-3(b)(1), unless the authorization is terminated or revoked.  Performed at [facility], [place] [address], [place], [place] [address]   MRSA Next Gen by PCR, Nasal     Status: Abnormal   Collection Time: 06/23/2021 10:52 AM   Specimen: Nasal Mucosa; Nasal Swab  Result Value Ref Range Status   MRSA by PCR Next Gen DETECTED (A) NOT DETECTED Final    Comment: RESULT CALLED TO, READ BACK BY AND VERIFIED WITH: HEAVNER,A. RN @1405  ON 08.19.2022 BY COHEN,K (NOTE) The GeneXpert MRSA Assay (FDA approved for NASAL specimens only), is one component of a comprehensive MRSA colonization surveillance program. It is not intended to diagnose MRSA infection nor to guide or monitor treatment for MRSA infections. Test performance is not FDA  approved in patients less than 2  years old. Performed at [facility], [place] [address], [place], [place] [address]   Urine Culture     Status: None   Collection Time: 07/04/21  8:07 AM   Specimen: Urine, Catheterized  Result Value Ref Range Status   Specimen Description   Final    URINE, CATHETERIZED Performed at [facility] [address], [place], [place] [address]    Special Requests   Final    NONE Performed at [facility], [place] [address], [place], [place] [address]    Culture   Final    NO GROWTH Performed at [facility], [place] [address] [place], [place] [address]    Report Status 07/05/2021 FINAL  Final    Labs: Results for orders placed or performed during the hospital encounter of 07/02/2021 (from the past 48 hour(s))  Glucose, capillary     Status: None   Collection Time: 07/06/21  8:34 AM  Result Value Ref Range   Glucose-Capillary 85 70 - 99 mg/dL    Comment: Glucose reference range applies only to samples taken after fasting for at least 8 hours.   Comment 1 Document in Chart   Glucose, capillary     Status: Abnormal   Collection Time: 07/06/21 11:18 AM  Result Value Ref Range   Glucose-Capillary 142 (H) 70 - 99 mg/dL    Comment: Glucose reference range applies only to samples taken after fasting for at least 8 hours.  Glucose, capillary     Status: Abnormal   Collection Time: 07/06/21  3:42 PM  Result Value Ref Range   Glucose-Capillary 126 (H) 70 - 99 mg/dL    Comment: Glucose reference range applies only to samples taken after fasting for at least 8 hours.  Glucose, capillary     Status: None   Collection Time: 07/06/21  8:08 PM  Result Value Ref Range   Glucose-Capillary 92 70 - 99 mg/dL    Comment: Glucose reference range applies only to samples taken after fasting for at least 8 hours.  CBC     Status: Abnormal   Collection Time: 07/08/21  4:40 AM  Result Value Ref Range   WBC 17.9 (H)  4.0 - 10.5 K/uL   RBC 3.19 (L) 4.22 - 5.81 MIL/uL   Hemoglobin 9.0 (L) 13.0 - 17.0 g/dL   HCT 30.8 (L) 39.0 - 52.0 %   MCV 96.6 80.0 - 100.0 fL   MCH 28.2 26.0 - 34.0 pg   MCHC 29.2 (L) 30.0 - 36.0 g/dL   RDW 19.4 (H) 11.5 - 15.5 %   Platelets 365 150 - 400 K/uL   nRBC 0.2 0.0 - 0.2 %    Comment: Performed at [facility], [place] [address], [place],  [address]  Glucose, capillary     Status: Abnormal   Collection Time: 07/08/21  4:42 AM  Result Value Ref Range   Glucose-Capillary 103 (H) 70 - 99 mg/dL    Comment: Glucose reference range applies only to samples taken after fasting for at least 8 hours.  Comprehensive metabolic panel     Status: Abnormal   Collection Time: 07/08/21  4:47 AM  Result Value Ref Range   Sodium 129 (L) 135 - 145 mmol/L   Potassium 4.6 3.5 - 5.1 mmol/L   Chloride 90 (L) 98 - 111 mmol/L   CO2 30 22 - 32 mmol/L   Glucose, Bld 111 (H) 70 - 99 mg/dL    Comment:  Glucose reference range applies only to samples taken after fasting for at least 8 hours.   BUN 21 8 - 23 mg/dL   Creatinine, Ser 1.45 (H) 0.61 - 1.24 mg/dL   Calcium 8.6 (L) 8.9 - 10.3 mg/dL   Total Protein 5.4 (L) 6.5 - 8.1 g/dL   Albumin 2.4 (L) 3.5 - 5.0 g/dL   AST 13 (L) 15 - 41 U/L   ALT 14 0 - 44 U/L   Alkaline Phosphatase 92 38 - 126 U/L   Total Bilirubin 0.5 0.3 - 1.2 mg/dL   GFR, Estimated 50 (L) >60 mL/min    Comment: (NOTE) Calculated using the CKD-EPI Creatinine Equation (2021)    Anion gap 9 5 - 15    Comment: Performed at [facility], [place] [address], [place], [place] [address]  Lactic acid, plasma     Status: None   Collection Time: 07/08/21  5:03 AM  Result Value Ref Range   Lactic Acid, Venous 0.8 0.5 - 1.9 mmol/L    Comment: Performed at [facility], [place] [address] [place], [place] [address]  Type and screen [place]     Status: None   Collection Time: 07/08/21  6:58 AM  Result Value Ref  Range   ABO/RH(D) A POS    Antibody Screen NEG    Sample Expiration      07/11/2021,2359 Performed at [facility], [place] [address] [place], [place] [address]     Imaging / Studies: DG Chest 1 View  Result Date: 07/08/2021 CLINICAL DATA:  Dyspnea EXAM: CHEST  1 VIEW COMPARISON:  07/04/2021 FINDINGS: Hazy appearance of the bilateral chest with layering pleural effusions. Normal heart size and unremarkable mediastinal contours for rotation. Remote right clavicle fracture with nonunion IMPRESSION: Pleural effusions and lower lobe atelectasis based on abdominal CT 3 days ago. No significant change from radiograph 07/04/2021. Electronically Signed   By: Monte Fantasia M.D.   On: 07/08/2021 05:26    Medications / Allergies: per chart  Antibiotics: Anti-infectives (From admission, onward)    Start     Dose/Rate Route Frequency Ordered Stop   06/18/2021 1400  piperacillin-tazobactam (ZOSYN) IVPB 3.375 g        3.375 g 12.5 mL/hr over 240 Minutes Intravenous Every 8 hours 06/06/2021 1357 06/27/21 1016   06/30/2021 0745  cefoTEtan (CEFOTAN) 2 g in sodium chloride 0.9 % 100 mL IVPB        2 g 200 mL/hr over 30 Minutes Intravenous On call to O.R. 06/08/2021 0740 06/30/2021 0831   06/15/2021 0745  sodium chloride 0.9 % with cefoTEtan (CEFOTAN) ADS Med       Note to Pharmacy: Charmayne Sheer   : cabinet override      06/27/2021 0745 06/16/2021 0854   06/23/2021 0700  piperacillin-tazobactam (ZOSYN) IVPB 3.375 g        3.375 g 100 mL/hr over 30 Minutes Intravenous  Once 06/10/2021 0653 06/13/2021 0733         Note: Portions of this report may have been transcribed using voice recognition software. Every effort was made to ensure accuracy; however, inadvertent computerized transcription errors may be present.   Any transcriptional errors that result from this process are unintentional.    Daniel Hector, MD, FACS, MASCRS Esophageal, Gastrointestinal & Colorectal Surgery Robotic and  Minimally Invasive Surgery  [facility], [place]  [place]. [address] [place] [address] [place],  [address] [phone]  [phone] Fax [phone] Main  CONTACT INFORMATION:  Weekday (9AM-5PM): Call [facility] main office at [phone]  Weeknight (5PM-9AM) or Weekend/Holiday: Check [email or web address] (password " [email or web address]") for General Surgery [facility] coverage  (Please, do not use SecureChat as it is not reliable communication to operating surgeons for immediate patient care)      07/08/2021  8:06 AM

## 2021-07-08 NOTE — Significant Event (Signed)
Patient found to be lethargic shortly after 1800.  Came to bedside.  He would arouse to sternal rub.  ABG reveals pH 7.15 with PCO2 in the 80s.  Likely CO2 narcosis exacerbated by encephalopathy due to sepsis.  Notably UA dirty.  Discussed with wife that given his multiorgan failure that resuscitative efforts are likely futile and he is unlikely to survive this hospitalization.  He was placed on AVAPS in an effort to blow off CO2 and improve his encephalopathy.  Wife Caren Griffins) called me back and stated she had spoken with his 2 living children and they all agreed that given his multiorgan failure that they would not want him to go through additional life support given his grave prognosis.  Further direction, CODE STATUS changed to DNR.  Attempted to discuss with patient based encephalopathic and cannot participate.

## 2021-07-08 NOTE — Progress Notes (Signed)
Pharmacy Antibiotic Note  Daniel King is a 75 y.o. male admitted on 06/21/2021 with sepsis.  Pharmacy has been consulted for vancomycin  dosing.  Of note, Pt Scr has inc over night prior to start of abx up to 1.45. Pt also being started on pressors.   Plan: Vancomycin 1500 mg IV x1 then 1000 mg IV q24h  Cefepime 2 gr IV q12h for current renal function  Monitor Scr closely due to uptrend  Monitor clinical course, renal function, cultures as available   Height: 6' (182.9 cm) Weight: 73.3 kg (161 lb 11.2 oz) IBW/kg (Calculated) : 77.6  Temp (24hrs), Avg:97.7 F (36.5 C), Min:97.5 F (36.4 C), Max:97.9 F (36.6 C)  Recent Labs  Lab 07/02/21 1907 07/03/21 0243 07/04/21 0244 07/05/21 0306 07/06/21 0250 07/08/21 0440 07/08/21 0447 07/08/21 0503  WBC  --  9.6 14.6* 16.5* 11.1* 17.9*  --   --   CREATININE 0.68 0.75 0.76 0.76  --   --  1.45*  --   LATICACIDVEN  --   --   --   --   --   --   --  0.8    Estimated Creatinine Clearance: 45.6 mL/min (A) (by C-G formula based on SCr of 1.45 mg/dL (H)).    No Known Allergies  Antimicrobials this admission:  8/19 Zosyn >> 8/24 9/4 cefepime >>  9/4 vancomycin >>   Dose adjustments this admission:    Microbiology results:  8/19 MRSA PCR: detected 8/19 COVID neg, influenza neg 8/31 UCx: NGF  9/4 BCx:   Thank you for allowing pharmacy to be a part of this patient's care.  Royetta Asal, PharmD, BCPS 07/08/2021 8:55 AM

## 2021-07-08 NOTE — Consult Note (Signed)
NAME:  Daniel King, MRN:  [id number], DOB:  Mar 09, 1946, LOS: 20 ADMISSION DATE:  06/21/2021, CONSULTATION DATE:  07/08/21 REFERRING MD: TRH, CHIEF COMPLAINT: Hypotension   History of Present Illness:  75 year old man on day 33 of hospitalization following bowel perforation status post resection and ileostomy who is critically ill for many days status post extubation and transferred to the floor 2 days prior who returns with hypotension, encephalopathy, hypothermia, renal failure.  Seem to have been progressing.  Moving towards discharge.  He was seen out of the stepdown unit 2 days prior.  Lab seems stable.  Review of vital signs over the last 40 to 72 hours show a gradual decline in heart rate to sinus bradycardia in the 50s large of last 24 hours.  Slow decline in blood pressure from normotensive to borderline hypotensive with SBP's in the 90s to 100s but maps greater than 65.  BPs have been more like 130s over 80s 3 to 4 days prior.  Review of most recent chest erasers small to moderate bilateral pleural effusions.  Stable.  Notably had been on CT scan end of 06/2021.  On exam he is cool.  Upper extremity shows swelling.  He is hypothermic.  Creatinine is doubled overnight.  Worsening leukocytosis    Pertinent  Medical History  COPD, RV dysfunction, new diagnosis colon cancer after bowel perforation and resection earlier this hospitalization 06/2021  Significant Hospital Events: Including procedures, antibiotic start and stop dates in addition to other pertinent events   8/19 admit with abd pain, CT with concern for perforation to OR for ex-lap with abdominal colectomy, colostomy. Returned to ICU on vent.  MRSA PCR positive COVID PCR negative Flu PCR negative Left IJ 8/19  Foley 8/19 > 8/22 ETT 8/19  >>8/22 06/24/21 - NAEON. UOP a bit better, still relatively oliguric. Cr stable. Alert, on SBT. Anticipate extubation. 06/25/21 : Failed SBT yesterday.  Lasix given.  Yesterday afebrile.   White count down to 11 point 9K.  Remains on ventilator 40% FiO2.  On fentanyl infusion, Precedex infusion, Levophed infusion off.  Only on vasopressin infusion.  Getting magnesium sulfate and potassium.  Remains on Zosyn.  Surgery noted that his oliguria is improving and creatinine normalized and to anticipate third space losses given colectomy -extubated 8/23 remains extubated. Hypercapnic. Did not do much BiPAP due to NG tube. On amio gtt and rate controlled . This AM - agitated and confused and hallucinating despite precedex gtt. Wheezing. Chart review shows severe Protein cal malnutn pre admit alb 2.,2. EKG now with NSR - QTc 511 mosec. On precedex, amio gtt, comparize prn.  Amiodarone discontinued due to prolonged QTC echocardiogram ordered.LVEF 60%.  Normal function.  Mild left ventricular hypertrophy RV function mildly reduced 8/24: Still confused.  Still on Precedex drip.  NG tube removed per surgery.  Modified barium swallow ordered. 8/25 Precedex stopped.  No longer confused.  Slowly advancing diet.  Barium swallow did demonstrate mild dysphagia.  Recommendations made by SLP.  Fairly hypertensive, Norvasc increased, as needed hydralazine. .  No longer needs critical care we will ask medicine to continue to provide support.  Internal medicine contacted and asked to assume primary consultant duties for medical needs, pulmonary following for specific pulmonary problems.  Given his advanced lung disease and malignancy we did ask palliative care to see the patient in consult to assist with goals of care 07/02/21: Intermittently wearing BiPAP - PCCM signed off 9/2 transferred out of SD unit 9/4 reconsult for hypotension  Interim History / Subjective:  As above  Objective   Blood pressure (!) 102/31, pulse 81, temperature 97.9 F (36.6 C), temperature source Oral, resp. rate 15, height 6' (1.829 m), weight 73.3 kg, SpO2 100 %.        Intake/Output Summary (Last 24 hours) at 07/08/2021 1112 Last  data filed at 07/08/2021 0600 Gross per 24 hour  Intake 178.89 ml  Output 400 ml  Net -221.11 ml   Filed Weights   07/06/21 0500 07/07/21 0500 07/08/21 0500  Weight: 69.7 kg 74 kg 73.3 kg    Examination: General: Lethargic, easily arousable, chronically and acutely ill-appearing Eyes: EOMI, no icterus Neck: Supple, no JVP appreciated Cardiovascular: Bradycardic, regular rhythm Pulmonary: Distant, coarse, junky Abdomen: Nondistended, nonpainful Extremities: Cool, upper extremity edema present   Resolved Hospital Problem list     Assessment & Plan:  Shock: Presumed septic shock.  Encephalopathy, renal failure.  Hypothermic, worsening leukocytosis.  Unclear source.  Possible pneumonia given secretions although he is on minimal oxygen and breathing appears okay.  Will obtain blood cultures, urine culture, UA.  Broad-spectrum antibiotics to start.  He is cool on cautious fluid resuscitation.  Norepinephrine to maintain blood pressure.  Notably RV dysfunction on recent echo with normal left-sided EF. --Status post 1.5 L IV fluid bolus --Norepinephrine map goal greater than 65 --Vancomycin/cefepime --Follow-up UA, blood cultures  Acute renal failure: Creatinine doubled, urine output marginal.  Bladder scan with very little urine.  Suspect related to hypotension on the floor in the preceding day not well documented.  Possible prebladder obstruction but will have to be bilateral so unlikely.  Notably, has been on scheduled low-dose ibuprofen. --Renal ultrasound --Fluids --Vasopressor support --D/C ibuprofen  Volume overload: Suspect third spacing in the setting of poor nutrition after significant bowel surgery.   -- Assess blood pressure response to fluid resuscitation, consider additional component of shock physiology in addition to sepsis presumed at this time, possible cardiogenic  COPD: Breathing stable on 2 L nasal cannula with high oxygen saturation. --Continue nebulized  therapies    Best Practice (right click and "Reselect all SmartList Selections" daily)   Diet/type: NPO w/ oral meds DVT prophylaxis: prophylactic heparin  GI prophylaxis: N/A Lines: N/A Foley:  N/A Code Status:  full code Last date of multidisciplinary goals of care discussion [9/4 discussed with wife and patient at bedside discussed grave prognosis with multiorgan failure and new diagnosis cancer.  Patient says he would not be full code.  I counseled him that I think that surviving a code event is unlikely and certainly his chance of surviving hospitalization if he were to return to life support was very low.  He expressed understanding and desires full code.  Discussed with wife who says she would not do that for him but she will honor his wishes.]  Labs   CBC: Recent Labs  Lab 07/02/21 0301 07/03/21 0243 07/04/21 0244 07/05/21 0306 07/06/21 0250 07/08/21 0440  WBC 8.7 9.6 14.6* 16.5* 11.1* 17.9*  NEUTROABS 6.4 7.1 12.5*  --   --   --   HGB 8.9* 9.0* 8.8* 8.7* 8.6* 9.0*  HCT 29.5* 29.4* 28.4* 28.3* 28.0* 30.8*  MCV 91.9 91.0 89.9 91.3 91.8 96.6  PLT 377 375 392 390 368 [id number]    Basic Metabolic Panel: Recent Labs  Lab 07/02/21 0301 07/02/21 1907 07/03/21 0243 07/04/21 0244 07/05/21 0306 07/08/21 0447  NA 138 140 138 139 139 129*  K 4.2 4.4 4.4 4.3 4.0 4.6  CL 97* 99  98 98 98 90*  CO2 35* 33* 32 32 34* 30  GLUCOSE 93 122* 96 91 90 111*  BUN '10 11 12 14 14 21  '$ CREATININE 0.65 0.68 0.75 0.76 0.76 1.45*  CALCIUM 8.9 9.1 8.9 8.9 9.1 8.6*  MG 1.6*  --  2.0 1.9  --   --   PHOS 3.7  --  3.8 3.7  --   --    GFR: Estimated Creatinine Clearance: 45.6 mL/min (A) (by C-G formula based on SCr of 1.45 mg/dL (H)). Recent Labs  Lab 07/04/21 0244 07/05/21 0306 07/06/21 0250 07/08/21 0440 07/08/21 0503  WBC 14.6* 16.5* 11.1* 17.9*  --   LATICACIDVEN  --   --   --   --  0.8    Liver Function Tests: Recent Labs  Lab 07/02/21 0301 07/03/21 0243 07/04/21 0244  07/08/21 0447  AST 11* 11* 12* 13*  ALT '21 18 16 14  '$ ALKPHOS 62 60 69 92  BILITOT 0.9 0.8 0.8 0.5  PROT 5.3* 5.4* 5.4* 5.4*  ALBUMIN 2.5* 2.5* 2.6* 2.4*   No results for input(s): LIPASE, AMYLASE in the last 168 hours. Recent Labs  Lab 07/03/21 1254  AMMONIA 25    ABG    Component Value Date/Time   PHART 7.375 07/02/2021 1035   PCO2ART 59.0 (H) 07/02/2021 1035   PO2ART 63.9 (L) 07/02/2021 1035   HCO3 33.7 (H) 07/02/2021 1035   ACIDBASEDEF 0.4 06/17/2021 1130   O2SAT 91.0 07/02/2021 1035     Coagulation Profile: No results for input(s): INR, PROTIME in the last 168 hours.  Cardiac Enzymes: No results for input(s): CKTOTAL, CKMB, CKMBINDEX, TROPONINI in the last 168 hours.  HbA1C: Hgb A1c MFr Bld  Date/Time Value Ref Range Status  06/27/2021 05:01 PM 5.6 4.8 - 5.6 % Final    Comment:    (NOTE) Pre diabetes:          5.7%-6.4%  Diabetes:              >6.4%  Glycemic control for   <7.0% adults with diabetes     CBG: Recent Labs  Lab 07/06/21 0834 07/06/21 1118 07/06/21 1542 07/06/21 2008 07/08/21 0442  GLUCAP 85 142* 126* 92 103*    Review of Systems:   Unable to obtain due to intermittent encephalopathy  Past Medical History:  He,  has a past medical history of COPD (chronic obstructive pulmonary disease) ([place]).   Surgical History:   Past Surgical History:  Procedure Laterality Date   LAPAROTOMY N/A 07/04/2021   Procedure: EXPLORATORY LAPAROTOMY ABDOMINAL COLECTOMY WITH END ILEOSTOMY;  Surgeon: Michael Boston, MD;  Location: [facility];  Service: General;  Laterality: N/A;     Social History:   reports that he has been smoking cigarettes. He has a 15.00 pack-year smoking history. He has never used smokeless tobacco. He reports that he does not currently use drugs after having used the following drugs: Marijuana. He reports that he does not drink alcohol.   Family History:  His family history is not on file.   Allergies No Known Allergies   Home  Medications  Prior to Admission medications   Medication Sig Start Date End Date Taking? Authorizing Provider  acetaminophen (TYLENOL) 500 MG tablet Take 500 mg by mouth every 6 (six) hours as needed for mild pain, fever or headache.   Yes [provider]  albuterol (VENTOLIN HFA) 108 (90 Base) MCG/ACT inhaler Inhale 1-2 puffs into the lungs every 6 (six) hours as needed  for wheezing or shortness of breath.   Yes [provider]  alendronate (FOSAMAX) 70 MG tablet Take 70 mg by mouth once a week. Take with a full glass of water on an empty stomach.   Yes [provider]  amLODipine (NORVASC) 5 MG tablet Take 5 mg by mouth 2 (two) times daily.   Yes [provider]  aspirin EC 81 MG tablet Take 81 mg by mouth daily. Swallow whole.   Yes [provider]  atorvastatin (LIPITOR) 40 MG tablet Take 40 mg by mouth daily.   Yes [provider]  Cholecalciferol (VITAMIN D3) 50 MCG (2000 UT) capsule Take 2,000 Units by mouth daily.   Yes [provider]  clopidogrel (PLAVIX) 75 MG tablet Take 75 mg by mouth daily.   Yes [provider]  docusate sodium (COLACE) 100 MG capsule Take 100 mg by mouth 2 (two) times daily as needed for mild constipation.   Yes [provider]  ferrous sulfate 325 (65 FE) MG tablet Take 325 mg by mouth 3 (three) times a week. Monday, Wednesday, Friday   Yes [provider]  lisinopril (ZESTRIL) 20 MG tablet Take 20 mg by mouth in the morning and at bedtime.   Yes [provider]  metoprolol tartrate (LOPRESSOR) 25 MG tablet Take 12.5 mg by mouth 2 (two) times daily.   Yes [provider]  Tiotropium Bromide-Olodaterol (STIOLTO RESPIMAT) 2.5-2.5 MCG/ACT AERS Inhale 1 puff into the lungs daily.   Yes [provider]  vitamin C (ASCORBIC ACID) 500 MG tablet Take 500 mg by mouth daily.   Yes [provider]  XTANDI 40 MG tablet Take 160 mg by mouth daily. 03/06/21   Yes [provider]     Critical care time:     CRITICAL CARE Performed by: Lanier Clam   Total critical care time: 50 minutes  Critical care time was exclusive of separately billable procedures and treating other patients.  Critical care was necessary to treat or prevent imminent or life-threatening deterioration.  Critical care was time spent personally by me on the following activities: development of treatment plan with patient and/or surrogate as well as nursing, discussions with consultants, evaluation of patient's response to treatment, examination of patient, obtaining history from patient or surrogate, ordering and performing treatments and interventions, ordering and review of laboratory studies, ordering and review of radiographic studies, pulse oximetry and re-evaluation of patient's condition.

## 2021-07-09 DIAGNOSIS — J9601 Acute respiratory failure with hypoxia: Secondary | ICD-10-CM | POA: Diagnosis not present

## 2021-07-09 LAB — CBC
HCT: 29.9 % — ABNORMAL LOW (ref 39.0–52.0)
Hemoglobin: 9 g/dL — ABNORMAL LOW (ref 13.0–17.0)
MCH: 28.6 pg (ref 26.0–34.0)
MCHC: 30.1 g/dL (ref 30.0–36.0)
MCV: 94.9 fL (ref 80.0–100.0)
Platelets: 347 10*3/uL (ref 150–400)
RBC: 3.15 MIL/uL — ABNORMAL LOW (ref 4.22–5.81)
RDW: 19.4 % — ABNORMAL HIGH (ref 11.5–15.5)
WBC: 23.5 10*3/uL — ABNORMAL HIGH (ref 4.0–10.5)
nRBC: 0.2 % (ref 0.0–0.2)

## 2021-07-09 LAB — BASIC METABOLIC PANEL
Anion gap: 11 (ref 5–15)
BUN: 28 mg/dL — ABNORMAL HIGH (ref 8–23)
CO2: 28 mmol/L (ref 22–32)
Calcium: 8.6 mg/dL — ABNORMAL LOW (ref 8.9–10.3)
Chloride: 95 mmol/L — ABNORMAL LOW (ref 98–111)
Creatinine, Ser: 1.93 mg/dL — ABNORMAL HIGH (ref 0.61–1.24)
GFR, Estimated: 36 mL/min — ABNORMAL LOW (ref >60)
Glucose, Bld: 81 mg/dL (ref 70–99)
Potassium: 5.1 mmol/L (ref 3.5–5.1)
Sodium: 134 mmol/L — ABNORMAL LOW (ref 135–145)

## 2021-07-09 MED ORDER — PANTOPRAZOLE SODIUM 40 MG IV SOLR
40.0000 mg | Freq: Every day | INTRAVENOUS | Status: DC
  Administered 2021-07-09 – 2021-07-10 (×2): 40 mg via INTRAVENOUS
  Filled 2021-07-09 (×2): qty 40

## 2021-07-09 MED ORDER — LACTATED RINGERS IV BOLUS
1000.0000 mL | Freq: Once | INTRAVENOUS | Status: AC
  Administered 2021-07-09: 1000 mL via INTRAVENOUS

## 2021-07-09 NOTE — Progress Notes (Signed)
Daniel King [id number] July 28, 1946  CARE TEAM:  PCP: Henreitta Cea, MD  Outpatient Care Team: Patient Care Team: Henreitta Cea, MD as PCP - General (Family Medicine) Michael Boston, MD as Consulting Physician (General Surgery)  Inpatient Treatment Team: Treatment Team: Attending Provider: Lanier Clam, MD; Consulting Physician: Edison Pace, Md, MD; Consulting Physician: Silas Flood Bonna Gains, MD; Consulting Physician: Tawni Millers, MD; Rounding Team: Pccm, Md, MD; Registered Nurse: Linward Headland, RN; Charge Nurse: Julio Alm, RN; Rounding Team: Garner Gavel, MD; Registered Nurse: Cyndie Chime, RN; Registered Nurse: Francesco Runner, RN   Problem List:   Principal Problem:   Acute respiratory failure with hypoxia 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  Cecum and ascending colon with ischemia and patches of phlegmon and gangrene.  Microperforation.  No major feculent contamination.  Rocky hard rectosigmoid mass without any major inflammation.  Suspicious for malignancy.  Abdominal colectomy with end ileostomy done.  Prolene suture at rectal stump.  Oozy tissues since fully anticoagulated Plavix but hemostasis insured.  SURGICAL PATHOLOGY   * THIS IS AN  ADDENDUM REPORT *  CASE: [phone]  PATIENT: Daniel King  Surgical Pathology Report  *Addendum *   Reason for Addendum #1:  DNA Mismatch Repair IHC Results   Clinical History: Perforated bowel ([facility])      FINAL MICROSCOPIC DIAGNOSIS:   A. COLON, RECTO-SIGMOID STRICTURE AND OMENTUM, RESECTION:  - Invasive colorectal adenocarcinoma, 4 cm.  - Focal changes consistent with serosal involvement, see comment.  - Margins not involved.  - Metastatic carcinoma in one of thirty-six lymph nodes (1/36).  - Two satellite tumor nodules.  - Four separate tubular adenomas.  - Benign appendix.  - See oncology table and comment.   B. PERITONEAL MASS, EXCISION:  - Fibrotic and partially calcified nodule consistent with infarcted  appendices epiploica.  - No evidence of malignancy.   ONCOLOGY TABLE:  COLON AND RECTUM, CARCINOMA:  Resection  Procedure: Abdominal colectomy.  Tumor Site: Sigmoid.  Tumor Size: 4 x 4 cm.  Macroscopic Tumor Perforation: Not identified.  Histologic Type: Colorectal adenocarcinoma.  Histologic Grade: G2, moderately differentiated.  Multiple Primary Sites: Not applicable.  Tumor Extension: Into pericolonic connective tissue and focal serosal  involvement, see comment.  Lymphovascular Invasion: Present.  Perineural Invasion: Not identified.  Treatment Effect: No known presurgical therapy.  Margins: All surgical margins negative for carcinoma.  Regional Lymph Nodes:       Number of Lymph Nodes with Tumor: 1       Number of Lymph Nodes Examined: 36  Tumor Deposits: 2  Pathologic Stage Classification (pTNM, AJCC 8th Edition): pT4a, pN1a  Ancillary Studies: MMR and MSI testing have been ordered and will be  reported separately.  Representative Tumor Block: A4-A7  Comments: The carcinoma extends into the subserosal connective tissue  and the  adjacent serosa shows extensive fibroinflammatory reaction and  the findings are most consistent with focal serosal  involvement.  (v4.2.0.1)   Ravin Bendall DESCRIPTION:  Specimen A: Subtotal colectomy, to include right colon with cecum and  appendix, portion of terminal ileum, and clinically includes  rectosigmoid, with separate portion of omentum, received fresh.  Specimen integrity: The specimen is unopened.  No obvious defects.  (See  description under heading additional findings).  Specimen length: There are 6 cm of terminal ileum, and colon is 137 cm  from cecum to distal margin.  The right colon is dilated up to 12 cm and  filled with green-brown fluid/soft material.  f intactness: Minimal included mesorectum.  Tumor location: Sigmoid  Tumor size: 4 cm in length and 4 cm in width tan-pink firm sessile mass.  Percent of bowel circumference involved: 100%, stenosing the lumen to  less than 1 cm.  Tumor distance to margins:                       Proximal: 130 cm                       Distal: 8 cm                       Mesenteric (sigmoid and transverse): 1.7 cm  Macroscopic extent of tumor invasion: The mass involves full-thickness  of the muscularis, and involves underlying fat.  The mass also abuts  serosa with possible serosal involvement.  Total presumed lymph nodes: Found are 37 possible lymph nodes which  range from 0.3 to 1 cm.  Extramural satellite tumor nodules: There is a 1.3 cm possible satellite  tumor nodule 0.8 cm from the mass.  Mucosal polyp(s): There are 4 tan-pink polyps scattered throughout the  colon, ranging from 0.5 to 0.8 cm.  Additional findings: The external surface of the right colon has diffuse  areas of gray-green to green-brown discoloration.  No obvious defects  are identified within the colon.  The appendix is 4.3 cm in length, up  to 0.9 cm in diameter, has a tan-pink to pink-red serosa with few  scattered adhesions, and cut surfaces are unremarkable.  Omentum: The included portion of omentum has unremarkable cut surfaces.  Block summary:  Block 1 = proximal margin   Block 2 = distal margin  Block 3 = tumor involving fat  Blocks 4, 5 = tumor and possible serosal involvement  Block 6 = tumor and adjacent mucosa  Block 7 = tissue for molecular testing  Block 8 = possible extramural satellite tumor nodule  Block 9 = 4 polyps  Block 10 = appendix  Block 11 = 4 possible nodes  Block 12 = 4 possible nodes  Block 13 = 4 possible nodes  Block 14 = 4 possible nodes  Block 15 = 4 possible nodes  Block 16 = 4 possible nodes  Block 17 = 4 possible nodes  Block 18 = 4 possible nodes  Block 19 = 5 possible nodes   Specimen B: Received fresh is a 0.8 cm firm pale yellow well-defined  nodule which has a smooth external surface on sectioning is cystic,  containing yellow-brown soft material.  There is a possible calcified  rim.  The specimen is sectioned and entirely submitted in 1 block.  SW 06/24/2021   Final Diagnosis performed by Claudette Laws, MD.   Electronically signed  06/25/2021  Technical and / or Professional  components performed at [facility], [place] [address], [place], [place] [address].   Immunohistochemistry Technical component (if applicable) was performed  at [facility] [address], [place],  [place], [place] [address].   IMMUNOHISTOCHEMISTRY DISCLAIMER (if applicable):  Some of these immunohistochemical stains may have been developed and the  performance characteristics determine by [facility]. Some  may not have been cleared or approved by the U.S. Food and Drug  Administration. The FDA has determined that such clearance or approval  is not necessary. This test is used for clinical purposes. It should not  be regarded as investigational or for research. This laboratory is  certified under the [place]  (CLIA-88) as qualified to perform high complexity clinical laboratory  testing.  The controls stained appropriately.     ADDENDUM:     Mismatch Repair Protein (IHC)   SUMMARY INTERPRETATION: NORMAL   There is preserved expression of the major MMR proteins. There is a very  low probability that microsatellite instability (MSI) is present.  However, certain clinically significant MMR protein mutations may result  in preservation of nuclear expression. It is recommended that the  preservation of protein expression be correlated with molecular based  MSI testing.   IHC EXPRESSION RESULTS   TEST           RESULT  MLH1:          Preserved nuclear expression  MSH2:          Preserved nuclear expression  MSH6:          Preserved nuclear expression  PMS2:          Preserved nuclear expression   References:  1. Guidelines on Genetic Evaluation and Management of Lynch Syndrome: A  Consensus Statement by the [place] Multi-Society Task Force on Colorectal  Cancer Gae Dry. Sherlie Ban , MD, and other . Am Nicki [place] 2014;  [phone]; doi: 10.1038/ajg.2014.186; published online 25 May 2013  2. Outcomes of screening endometrial cancer patients for Lynch syndrome  by patient-administered checklist. Olena Heckle MS, and others. Gynecol Oncol  2013;131(3):619-623.  3. Muir-Torre syndrome (MTS): An update and approach to diagnosis and  management. Shelly Flatten, MD and others. J Am Acad Dermatol  [phone]   Addendum #1 performed by Jaquita Folds, MD.   Electronically signed  06/26/2021  Technical and / or Professional components performed at [facility], [place] [address] [place], [place] [address].   Immunohistochemistry Technical component (if applicable) was performed  at [facility]. [address], [place],  [place], [place] [address].   IMMUNOHISTOCHEMISTRY DISCLAIMER (if applicable):  Some of these immunohistochemical stains may have been developed and the  performance characteristics determine by [facility]. Some  may not have been cleared or approved by the  U.S. Food and Drug  Administration. The FDA has determined that such clearance or approval  is not necessary. This test is used for clinical purposes. It should not  be regarded as investigational or for research. This laboratory is  certified under the [place]  (CLIA-88) as qualified to perform high complexity clinical laboratory  testing.  The controls stained appropriately.   Assessment  Worsening confusion suspicious for recurrent encephalopathy in the setting of oxygen pendant COPD requiring emergent surgery for stage III obstructing rectal cancer and colon perforation necrosis  [facility] Stay = 17 days)  Plan:  Decline in leukocytosis occurring  concerning, but no evidence of any bowel dysfunction that raises intra-abdominal pathology at this time.  Should he not improve, can reconsider CT scan with oral contrast but given that he had one only 4 days ago that was underwhelming, I am skeptical that he has a new abscess.  There is no anastomosis that could leak.  He has no peritonitis.  Surgery will continue to follow closely.  Try to wean oxygen back down since he is 100% on 4 L.  He most likely has hypoxic respiratory drive since he is on chronic oxygen at home.  Consider ABG.  Follow mental status.  Looks tired exhausted but not massively confused.  Hyponatremia per critical care/medicine.  Increasing creatinine most likely a sign of dehydration.  Challenge in this patient with an end ileostomy and oxygen pendant COPD.  Consider wound VAC for now to help with midline wound granulate.  May need to go to packing once he transfers to skilled facility.  Suspect that is not going to happen for a few days.  -VTE prophylaxis- SCDs, etc  -mobilize as tolerated to help recovery  Disposition:  Disposition:  The patient is from: Home  Anticipate discharge to:  [facility] (SNF)  Anticipated Date of Discharge is:  September  12,2022  Barriers to discharge:  Pending Clinical improvement (more likely than not)  Patient currently is NOT MEDICALLY STABLE for discharge from the hospital from a surgery standpoint.      25 minutes spent in review, evaluation, examination, counseling, and coordination of care.   I have reviewed this patient's available data, including medical history, events of note, physical examination and test results as part of my evaluation.  A significant portion of that time was spent in counseling.  Care during the described time interval was provided by me.  07/09/2021    Subjective: (Chief complaint)  Patient on mask.  Rather tired.  No nausea or vomiting.  No worsening abdominal pain.  Ileostomy functioning.  Objective:  Vital signs:  Vitals:   07/09/21 0428 07/09/21 0430 07/09/21 0500 07/09/21 0530  BP:  (!) 112/39 (!) 114/47 (!) 116/43  Pulse: 74 75 72 76  Resp: (!) 23 (!) 22 19 (!) 25  Temp:      TempSrc:      SpO2: 97% 97% 96% 96%  Weight:      Height:        Last BM Date: 07/09/21  Intake/Output   Yesterday:  09/04 0701 - 09/05 0700 In: 2145.2 [I.V.:653.8; IV Piggyback:1491.4] Out: 125 [Urine:100; Stool:25] This shift:  No intake/output data recorded.  Bowel function:  Flatus: YES  BM:  YES  Drain: (No drain)   Physical Exam:  General: Pt resting in moderate acute distress.  Tired.  Does awaken to follow some commands Eyes: PERRL, normal EOM.  Sclera clear.  No icterus Neuro: CN II-XII intact w/o focal sensory/motor deficits. Lymph: No head/neck/groin lymphadenopathy Psych:  No delerium/psychosis/paranoia.  Oriented x 2 HENT: Normocephalic, Mucus membranes moist.  No thrush Neck: Supple, No tracheal deviation.  No obvious thyromegaly Chest: No pain to chest wall compression.  Good respiratory excursion.  No audible wheezing CV:  Pulses intact.  Regular rhythm.  No major extremity edema MS: Normal AROM mjr joints.  No obvious deformity  Abdomen:  Soft.  Mildy distended.  Mildly tender at incisions only.  No evidence of peritonitis.  No incarcerated hernias. Right-sided ileostomy pink with no edema.  Gas and succus in bag.  Midline incision with  some granulation.  No dehiscence or purulence.  Ext:   No deformity.  No mjr edema.  No cyanosis Skin: No petechiae / purpurea.  No major sores.  Warm and dry    Results:   Cultures: Recent Results (from the past 720 hour(s))  Resp Panel by RT-PCR (Flu A&B, Covid) Nasopharyngeal Swab     Status: None   Collection Time: 06/25/2021  6:37 AM   Specimen: Nasopharyngeal Swab; Nasopharyngeal(NP) swabs in vial transport medium  Result Value Ref Range Status   SARS Coronavirus 2 by RT PCR NEGATIVE NEGATIVE Final    Comment: (NOTE) SARS-CoV-2 target nucleic acids are NOT DETECTED.  The SARS-CoV-2 RNA is generally detectable in upper respiratory specimens during the acute phase of infection. The lowest concentration of SARS-CoV-2 viral copies this assay can detect is 138 copies/mL. A negative result does not preclude SARS-Cov-2 infection and should not be used as the sole basis for treatment or other patient management decisions. A negative result may occur with  improper specimen collection/handling, submission of specimen other than nasopharyngeal swab, presence of viral mutation(s) within the areas targeted by this assay, and inadequate number of viral copies(<138 copies/mL). A negative result must be combined with clinical observations, patient history, and epidemiological information. The expected result is Negative.  Fact Sheet for Patients:  EntrepreneurPulse.com.au  Fact Sheet for Healthcare Providers:  IncredibleEmployment.be  This test is no t yet approved or cleared by the [place] FDA and  has been authorized for detection and/or diagnosis of SARS-CoV-2 by FDA under an Emergency Use Authorization (EUA). This EUA will remain  in effect  (meaning this test can be used) for the duration of the COVID-19 declaration under Section 564(b)(1) of the Act, 21 U.S.C.section 360bbb-3(b)(1), unless the authorization is terminated  or revoked sooner.       Influenza A by PCR NEGATIVE NEGATIVE Final   Influenza B by PCR NEGATIVE NEGATIVE Final    Comment: (NOTE) The Xpert Xpress SARS-CoV-2/FLU/RSV plus assay is intended as an aid in the diagnosis of influenza from Nasopharyngeal swab specimens and should not be used as a sole basis for treatment. Nasal washings and aspirates are unacceptable for Xpert Xpress SARS-CoV-2/FLU/RSV testing.  Fact Sheet for Patients: EntrepreneurPulse.com.au  Fact Sheet for Healthcare Providers: IncredibleEmployment.be  This test is not yet approved or cleared by the [place] FDA and has been authorized for detection and/or diagnosis of SARS-CoV-2 by FDA under an Emergency Use Authorization (EUA). This EUA will remain in effect (meaning this test can be used) for the duration of the COVID-19 declaration under Section 564(b)(1) of the Act, 21 U.S.C. section 360bbb-3(b)(1), unless the authorization is terminated or revoked.  Performed at [facility], [place] [address] [place], [place] [address]   MRSA Next Gen by PCR, Nasal     Status: Abnormal   Collection Time: 06/07/2021 10:52 AM   Specimen: Nasal Mucosa; Nasal Swab  Result Value Ref Range Status   MRSA by PCR Next Gen DETECTED (A) NOT DETECTED Final    Comment: RESULT CALLED TO, READ BACK BY AND VERIFIED WITH: HEAVNER,A. RN _0  ON 08.19.2022 BY COHEN,K (NOTE) The GeneXpert MRSA Assay (FDA approved for NASAL specimens only), is one component of a comprehensive MRSA colonization surveillance program. It is not intended to diagnose MRSA infection nor to guide or monitor treatment for MRSA infections. Test performance is not FDA approved in patients less than 23 years old. Performed  at [facility], [place] [address]  [place], [place] [address]   Urine Culture     Status: None   Collection Time: 07/04/21  8:07 AM   Specimen: Urine, Catheterized  Result Value Ref Range Status   Specimen Description   Final    URINE, CATHETERIZED Performed at [place] [address], [place], [place] [address]    Special Requests   Final    NONE Performed at [facility], [place] [address], [place], [place] [address]    Culture   Final    NO GROWTH Performed at [facility], [place] [address] [place], [place] [address]    Report Status 07/05/2021 FINAL  Final  Culture, blood (routine x 2)     Status: None (Preliminary result)   Collection Time: 07/08/21  8:38 AM   Specimen: BLOOD  Result Value Ref Range Status   Specimen Description   Final    BLOOD RIGHT ANTECUBITAL Performed at [place] [address] [place], [place] [address]    Special Requests   Final    BOTTLES DRAWN AEROBIC ONLY Blood Culture adequate volume Performed at [place] [address], [place], [place] [address]    Culture   Final    NO GROWTH < 24 HOURS Performed at [place] [address], [place], [place] [address]    Report Status PENDING  Incomplete  Culture, blood (routine x 2)     Status: None (Preliminary result)   Collection Time: 07/08/21  8:38 AM   Specimen: BLOOD  Result Value Ref Range Status   Specimen Description   Final    BLOOD BLOOD RIGHT HAND Performed at [place] [address], [place], [place] [address]    Special Requests   Final    BOTTLES DRAWN AEROBIC ONLY Blood Culture results may not be optimal due to an inadequate volume of blood received in culture bottles Performed at [facility] [place] [address], [place], [place] [address]    Culture   Final    NO GROWTH < 24 HOURS Performed at [place] [address] [place], [place] [address]    Report Status PENDING  Incomplete    Labs: Results for orders placed or performed during the hospital encounter of 06/15/2021 (from the past 48 hour(s))  CBC     Status: Abnormal   Collection Time: 07/08/21  4:40 AM  Result Value Ref Range   WBC 17.9 (H) 4.0 - 10.5 K/uL   RBC 3.19 (L) 4.22 - 5.81 MIL/uL   Hemoglobin 9.0 (L) 13.0 - 17.0 g/dL   HCT 30.8 (L) 39.0 - 52.0 %   MCV 96.6 80.0 - 100.0 fL   MCH 28.2 26.0 - 34.0 pg   MCHC 29.2 (L) 30.0 - 36.0 g/dL   RDW 19.4 (H) 11.5 - 15.5 %   Platelets 365 150 - 400 K/uL   nRBC 0.2 0.0 - 0.2 %    Comment: Performed at [facility], [place] [address], [place], [place] [address]  Glucose, capillary     Status: Abnormal   Collection Time: 07/08/21  4:42 AM  Result Value Ref Range   Glucose-Capillary 103 (H) 70 - 99 mg/dL    Comment: Glucose reference range applies only to samples taken after fasting for at least 8 hours.  Comprehensive metabolic panel     Status: Abnormal   Collection Time: 07/08/21  4:47 AM  Result Value  Ref Range   Sodium 129 (L) 135 - 145 mmol/L   Potassium 4.6 3.5 - 5.1 mmol/L   Chloride 90 (L) 98 - 111 mmol/L   CO2 30 22 - 32 mmol/L   Glucose, Bld 111 (H) 70 - 99 mg/dL    Comment: Glucose reference range applies only to samples taken after fasting for at least 8 hours.   BUN 21 8 - 23 mg/dL   Creatinine, Ser 1.45 (H) 0.61 - 1.24 mg/dL   Calcium 8.6 (L) 8.9 - 10.3 mg/dL   Total Protein 5.4 (L) 6.5 - 8.1 g/dL   Albumin 2.4 (L) 3.5 - 5.0 g/dL   AST 13 (L) 15 - 41 U/L   ALT 14 0 - 44 U/L   Alkaline Phosphatase 92 38 - 126 U/L   Total Bilirubin 0.5 0.3 - 1.2 mg/dL   GFR, Estimated 50 (L) >60 mL/min    Comment: (NOTE) Calculated using the CKD-EPI Creatinine Equation (2021)    Anion gap 9 5 - 15    Comment: Performed at [facility], [place] [address], [place], [place] [address]  Lactic acid, plasma     Status: None   Collection Time: 07/08/21   5:03 AM  Result Value Ref Range   Lactic Acid, Venous 0.8 0.5 - 1.9 mmol/L    Comment: Performed at [facility], [place] [address] [place], [place] [address]  Type and screen [place]     Status: None   Collection Time: 07/08/21  6:58 AM  Result Value Ref Range   ABO/RH(D) A POS    Antibody Screen NEG    Sample Expiration      07/11/2021,2359 Performed at [facility], [place] [address] [place], [place] [address]   Culture, blood (routine x 2)     Status: None (Preliminary result)   Collection Time: 07/08/21  8:38 AM   Specimen: BLOOD  Result Value Ref Range   Specimen Description      BLOOD RIGHT ANTECUBITAL Performed at [facility] [address], [place], [place] [address]    Special Requests      BOTTLES DRAWN AEROBIC ONLY Blood Culture adequate volume Performed at [facility] [address] [place], [place] [address]    Culture      NO GROWTH < 24 HOURS Performed at [place] [address] [place], [place] [address]    Report Status PENDING   Culture, blood (routine x 2)     Status: None (Preliminary result)   Collection Time: 07/08/21  8:38 AM   Specimen: BLOOD  Result Value Ref Range   Specimen Description      BLOOD BLOOD RIGHT HAND Performed at [facility] [address] [place], [place] [address]    Special Requests      BOTTLES DRAWN AEROBIC ONLY Blood Culture results may not be optimal due to an inadequate volume of blood received in culture bottles Performed at [place] [address], [place], [place] [address]    Culture      NO GROWTH < 24 HOURS Performed at [facility] [address] [place],  [address]    Report Status PENDING   Urinalysis, Routine w reflex microscopic Urine, In & Out Cath     Status: Abnormal   Collection Time: 07/08/21 11:23 AM  Result Value Ref Range    Color, Urine YELLOW (A) YELLOW  APPearance CLEAR (A) CLEAR   Specific Gravity, Urine 1.025 1.005 - 1.030   pH 5.5 5.0 - 8.0   Glucose, UA NEGATIVE NEGATIVE mg/dL   Hgb urine dipstick LARGE (A) NEGATIVE   Bilirubin Urine NEGATIVE NEGATIVE   Ketones, ur NEGATIVE NEGATIVE mg/dL   Protein, ur 30 (A) NEGATIVE mg/dL   Nitrite NEGATIVE NEGATIVE   Leukocytes,Ua MODERATE (A) NEGATIVE   RBC / HPF >50 (H) 0 - 5 RBC/hpf   WBC, UA >50 (H) 0 - 5 WBC/hpf   Bacteria, UA NONE SEEN NONE SEEN   Squamous Epithelial / LPF 6-10 0 - 5   Mucus PRESENT    Budding Yeast PRESENT    Hyaline Casts, UA PRESENT     Comment: Performed at [facility], [place] [address], [place], [place] [address]  Glucose, capillary     Status: None   Collection Time: 07/08/21  6:16 PM  Result Value Ref Range   Glucose-Capillary 79 70 - 99 mg/dL    Comment: Glucose reference range applies only to samples taken after fasting for at least 8 hours.  Blood gas, arterial     Status: Abnormal   Collection Time: 07/08/21  6:29 PM  Result Value Ref Range   FIO2 30.00    pH, Arterial 7.152 (LL) 7.350 - 7.450    Comment: CRITICAL RESULT CALLED TO, READ BACK BY AND VERIFIED WITH: OSIPIENKL,Z. 07/08/21 _0  BY SEEL,M     pCO2 arterial 86.2 (HH) 32.0 - 48.0 mmHg    Comment: CRITICAL RESULT CALLED TO, READ BACK BY AND VERIFIED WITH: OSIPIENKL,Z. 07/08/21 _1  BY SEEL,M     pO2, Arterial 77.5 (L) 83.0 - 108.0 mmHg   Bicarbonate 28.9 (H) 20.0 - 28.0 mmol/L   Acid-base deficit 0.7 0.0 - 2.0 mmol/L   O2 Saturation 92.9 %   Patient temperature 98.6    Collection site LEFT RADIAL    Drawn by [id number]    Allens test (pass/fail) PASS PASS    Comment: Performed at [facility], [place] [address], [place], [place] [address]  Basic metabolic panel     Status: Abnormal   Collection Time: 07/08/21  7:00 PM  Result Value Ref Range   Sodium 131 (L) 135 - 145 mmol/L   Potassium 5.2 (H) 3.5 - 5.1 mmol/L   Chloride  92 (L) 98 - 111 mmol/L   CO2 29 22 - 32 mmol/L   Glucose, Bld 82 70 - 99 mg/dL    Comment: Glucose reference range applies only to samples taken after fasting for at least 8 hours.   BUN 26 (H) 8 - 23 mg/dL   Creatinine, Ser 1.80 (H) 0.61 - 1.24 mg/dL   Calcium 8.6 (L) 8.9 - 10.3 mg/dL   GFR, Estimated 39 (L) >60 mL/min    Comment: (NOTE) Calculated using the CKD-EPI Creatinine Equation (2021)    Anion gap 10 5 - 15    Comment: Performed at [facility], [place] [address], [name], [place] [address]  CBC     Status: Abnormal   Collection Time: 07/09/21  2:39 AM  Result Value Ref Range   WBC 23.5 (H) 4.0 - 10.5 K/uL   RBC 3.15 (L) 4.22 - 5.81 MIL/uL   Hemoglobin 9.0 (L) 13.0 - 17.0 g/dL   HCT 29.9 (L) 39.0 - 52.0 %   MCV 94.9 80.0 - 100.0 fL   MCH 28.6 26.0 - 34.0 pg   MCHC 30.1 30.0 - 36.0 g/dL  RDW 19.4 (H) 11.5 - 15.5 %   Platelets 347 150 - 400 K/uL   nRBC 0.2 0.0 - 0.2 %    Comment: Performed at [facility] [place] [address], [place], [place] [address]  Basic metabolic panel     Status: Abnormal   Collection Time: 07/09/21  2:39 AM  Result Value Ref Range   Sodium 134 (L) 135 - 145 mmol/L   Potassium 5.1 3.5 - 5.1 mmol/L   Chloride 95 (L) 98 - 111 mmol/L   CO2 28 22 - 32 mmol/L   Glucose, Bld 81 70 - 99 mg/dL    Comment: Glucose reference range applies only to samples taken after fasting for at least 8 hours.   BUN 28 (H) 8 - 23 mg/dL   Creatinine, Ser 1.93 (H) 0.61 - 1.24 mg/dL   Calcium 8.6 (L) 8.9 - 10.3 mg/dL   GFR, Estimated 36 (L) >60 mL/min    Comment: (NOTE) Calculated using the CKD-EPI Creatinine Equation (2021)    Anion gap 11 5 - 15    Comment: Performed at [facility], [place] [address], [place], [place] [address]    Imaging / Studies: DG Chest 1 View  Result Date: 07/08/2021 CLINICAL DATA:  Dyspnea EXAM: CHEST  1 VIEW COMPARISON:  07/04/2021 FINDINGS: Hazy appearance of the bilateral chest with  layering pleural effusions. Normal heart size and unremarkable mediastinal contours for rotation. Remote right clavicle fracture with nonunion IMPRESSION: Pleural effusions and lower lobe atelectasis based on abdominal CT 3 days ago. No significant change from radiograph 07/04/2021. Electronically Signed   By: Monte Fantasia M.D.   On: 07/08/2021 05:26   US RENAL  Result Date: 07/08/2021 CLINICAL DATA:  Renal failure, COPD, smoker EXAM: RENAL / URINARY TRACT ULTRASOUND COMPLETE COMPARISON:  CT abdomen and pelvis 07/05/2021 FINDINGS: Right Kidney: Renal measurements: 10.0 x 4.2 x 4.6 cm = volume: 100 mL. Normal cortical thickness and echogenicity. Small simple cyst at upper pole 19 x 13 x 15 mm. Additional simple cyst at inferior pole 18 x 16 x 19 mm. No solid mass or hydronephrosis. No shadowing calcification. Left Kidney: Renal measurements: 8.9 x 4.7 x 4.2 cm = volume: 92 mL. Cortical thinning. Borderline increased cortical echogenicity. Echogenic focus at mid kidney 3.7 cm diameter, corresponding to large pelvic calculus on prior CT. Exophytic cyst at upper pole 2.9 x 2.9 x 2.5 cm, containing scattered internal echoes which appear to be primarily artifacts. No hydronephrosis or additional mass. Bladder: Bladder decompressed. Other: Scattered ascites IMPRESSION: BILATERAL renal cysts. 3.7 cm diameter calculus at LEFT renal pelvis without hydronephrosis. No hydronephrosis identified. Scattered ascites. Electronically Signed   By: Lavonia Dana M.D.   On: 07/08/2021 12:52    Medications / Allergies: per chart  Antibiotics: Anti-infectives (From admission, onward)    Start     Dose/Rate Route Frequency Ordered Stop   07/09/21 1000  vancomycin (VANCOCIN) IVPB 1000 mg/200 mL premix        1,000 mg 200 mL/hr over 60 Minutes Intravenous Every 24 hours 07/08/21 0859     07/08/21 1000  ceFEPIme (MAXIPIME) 2 g in dextrose 5 % 50 mL IVPB  Status:  Discontinued        2 g 100 mL/hr over 30 Minutes Intravenous  Every 12 hours 07/08/21 0816 07/08/21 0824   07/08/21 1000  ceFEPIme (MAXIPIME) 2 g in sodium chloride 0.9 % 100 mL IVPB        2 g 200 mL/hr  over 30 Minutes Intravenous Every 12 hours 07/08/21 0824     07/08/21 1000  vancomycin (VANCOREADY) IVPB 1500 mg/300 mL        1,500 mg 150 mL/hr over 120 Minutes Intravenous  Once 07/08/21 0859 07/08/21 1318   06/18/2021 1400  piperacillin-tazobactam (ZOSYN) IVPB 3.375 g        3.375 g 12.5 mL/hr over 240 Minutes Intravenous Every 8 hours 06/05/2021 1357 06/27/21 1016   06/16/2021 0745  cefoTEtan (CEFOTAN) 2 g in sodium chloride 0.9 % 100 mL IVPB        2 g 200 mL/hr over 30 Minutes Intravenous On call to O.R. 06/28/2021 0740 06/14/2021 0831   07/03/2021 0745  sodium chloride 0.9 % with cefoTEtan (CEFOTAN) ADS Med       Note to Pharmacy: Charmayne Sheer   : cabinet override      06/17/2021 0745 06/07/2021 0854   06/14/2021 0700  piperacillin-tazobactam (ZOSYN) IVPB 3.375 g        3.375 g 100 mL/hr over 30 Minutes Intravenous  Once 06/18/2021 0653 06/07/2021 0733         Note: Portions of this report may have been transcribed using voice recognition software. Every effort was made to ensure accuracy; however, inadvertent computerized transcription errors may be present.   Any transcriptional errors that result from this process are unintentional.    [name], MD, FACS, MASCRS Esophageal, Gastrointestinal & Colorectal Surgery Robotic and Minimally Invasive Surgery  [facility], [place]  [place] [address], [place], [place] [address] [phone] Fax [phone] Main  CONTACT INFORMATION:  Weekday (9AM-5PM): Call [facility] main office at [phone]  Weeknight (5PM-9AM) or Weekend/Holiday: Check [email or web address] (password " [email or web address]") for General Surgery [facility] coverage  (Please, do not use SecureChat as it is not reliable communication to operating surgeons for immediate patient care)       07/09/2021  7:23 AM

## 2021-07-09 NOTE — Progress Notes (Signed)
NAME:  Daniel King, MRN:  [id number], DOB:  1946-04-25, LOS: 42 ADMISSION DATE:  07/03/2021, CONSULTATION DATE:  07/09/21 REFERRING MD: TRH, CHIEF COMPLAINT: Hypotension   History of Present Illness:  75 year old man on day 71 of hospitalization following bowel perforation status post resection and ileostomy who is critically ill for many days status post extubation and transferred to the floor 2 days prior who returns with hypotension, encephalopathy, hypothermia, renal failure.  Seem to have been progressing.  Moving towards discharge.  He was seen out of the stepdown unit 2 days prior.  Lab seems stable.  Review of vital signs over the last 40 to 72 hours show a gradual decline in heart rate to sinus bradycardia in the 50s large of last 24 hours.  Slow decline in blood pressure from normotensive to borderline hypotensive with SBP's in the 90s to 100s but maps greater than 65.  BPs have been more like 130s over 80s 3 to 4 days prior.  Review of most recent chest erasers small to moderate bilateral pleural effusions.  Stable.  Notably had been on CT scan end of 06/2021.  On exam he is cool.  Upper extremity shows swelling.  He is hypothermic.  Creatinine is doubled overnight.  Worsening leukocytosis    Pertinent  Medical History  COPD, RV dysfunction, new diagnosis colon cancer after bowel perforation and resection earlier this hospitalization 06/2021  Significant Hospital Events: Including procedures, antibiotic start and stop dates in addition to other pertinent events   8/19 admit with abd pain, CT with concern for perforation to OR for ex-lap with abdominal colectomy, colostomy. Returned to ICU on vent.  MRSA PCR positive COVID PCR negative Flu PCR negative Left IJ 8/19  Foley 8/19 > 8/22 ETT 8/19  >>8/22 06/24/21 - NAEON. UOP a bit better, still relatively oliguric. Cr stable. Alert, on SBT. Anticipate extubation. 06/25/21 : Failed SBT yesterday.  Lasix given.  Yesterday afebrile.   White count down to 11 point 9K.  Remains on ventilator 40% FiO2.  On fentanyl infusion, Precedex infusion, Levophed infusion off.  Only on vasopressin infusion.  Getting magnesium sulfate and potassium.  Remains on Zosyn.  Surgery noted that his oliguria is improving and creatinine normalized and to anticipate third space losses given colectomy -extubated 8/23 remains extubated. Hypercapnic. Did not do much BiPAP due to NG tube. On amio gtt and rate controlled . This AM - agitated and confused and hallucinating despite precedex gtt. Wheezing. Chart review shows severe Protein cal malnutn pre admit alb 2.,2. EKG now with NSR - QTc 511 mosec. On precedex, amio gtt, comparize prn.  Amiodarone discontinued due to prolonged QTC echocardiogram ordered.LVEF 60%.  Normal function.  Mild left ventricular hypertrophy RV function mildly reduced 8/24: Still confused.  Still on Precedex drip.  NG tube removed per surgery.  Modified barium swallow ordered. 8/25 Precedex stopped.  No longer confused.  Slowly advancing diet.  Barium swallow did demonstrate mild dysphagia.  Recommendations made by SLP.  Fairly hypertensive, Norvasc increased, as needed hydralazine. .  No longer needs critical care we will ask medicine to continue to provide support.  Internal medicine contacted and asked to assume primary consultant duties for medical needs, pulmonary following for specific pulmonary problems.  Given his advanced lung disease and malignancy we did ask palliative care to see the patient in consult to assist with goals of care 07/02/21: Intermittently wearing BiPAP - PCCM signed off 9/2 transferred out of SD unit 9/4 reconsult for hypotension, encephalopathic,  hypothermic, leukocytosis concern for septic shock, UA dirty, worsening CO2 retention, placed on AVAPS, made DNR  Interim History / Subjective:  CO2 retention, on AVAPS, improvement in encephalopathy mildly but overall no change, very weak, do not anticipate much  recovery  Objective   Blood pressure (!) 120/52, pulse 76, temperature 98.8 F (37.1 C), temperature source Axillary, resp. rate (!) 21, height 6' (1.829 m), weight 69.3 kg, SpO2 99 %.    FiO2 (%):  [35 %] 35 % Set Rate:  [20 bmp] 20 bmp Vt Set:  [600 mL] 600 mL   Intake/Output Summary (Last 24 hours) at 07/09/2021 1055 Last data filed at 07/09/2021 0518 Gross per 24 hour  Intake 2140.15 ml  Output 125 ml  Net 2015.15 ml    Filed Weights   07/07/21 0500 07/08/21 0500 07/08/21 0800  Weight: 74 kg 73.3 kg 69.3 kg    Examination: General: Lethargic, arousable on AVAPS, chronically and acutely ill-appearing Eyes: EOMI, no icterus Neck: Supple, no JVP appreciated Cardiovascular: RR, regular rhythm Pulmonary: Distant, coarse, junky Abdomen: Nondistended, nonpainful Extremities: Cool, upper extremity edema present   Resolved Hospital Problem list     Assessment & Plan:  Shock: Presumed septic shock.  Encephalopathy, renal failure.  Hypothermic, worsening leukocytosis.  Unclear source.  Possible pneumonia given secretions although he is on minimal oxygen and breathing appears okay.  Will obtain blood cultures, urine culture, UA.  Broad-spectrum antibiotics to start.  He is cool on cautious fluid resuscitation.  Norepinephrine to maintain blood pressure.  Notably RV dysfunction on recent echo with normal left-sided EF. --additional fluid bolus today --Norepinephrine map goal greater than 65 --d/c vanc - continue cefepime for dirty UA --Follow-up blood cultures (no urine culture and now been on abx x 24 hours)  Toxic metabolic encephalopathy: Presumed due to severe sepsis.  Worsened with CO2 retention PM 9/4.  Worsened with renal. --Cefepime for sepsis, consider switching to not cephalosporin as this can cause encephalopathy however encephalopathy preceded antibiotics  Acute renal failure: Creatinine rising, urine output marginal.  Bladder scan with very little urine.  Suspect related  to hypotension on the floor in the preceding day not well documented.  Possible prebladder obstruction but will have to be bilateral so unlikely.  Notably, has been on scheduled low-dose ibuprofen. --Renal ultrasound ok --fluid challenge --Vasopressor support  Volume overload: Suspect third spacing in the setting of poor nutrition after significant bowel surgery.    COPD: retaining CO2 in setting of encephalopathy due to sepsis.  CO2 narcosis in the afternoon 9/4.  Mild improvement in encephalopathy with improvement in CO2, on AVAPS.  DNR. --Continue nebulized therapies --Continue AVAPS    Best Practice (right click and "Reselect all SmartList Selections" daily)   Diet/type: NPO w/ oral meds DVT prophylaxis: prophylactic heparin  GI prophylaxis: N/A Lines: N/A Foley:  N/A Code Status:  full code Last date of multidisciplinary goals of care discussion [9/4 discussed with wife clinical decline.  She discussed with his living children.  They agreed that given his prognosis that he would not achieve things that he would enjoy if he went back on life support.  Elected to make him DNR.]  Labs   CBC: Recent Labs  Lab 07/03/21 0243 07/04/21 0244 07/05/21 0306 07/06/21 0250 07/08/21 0440 07/09/21 0239  WBC 9.6 14.6* 16.5* 11.1* 17.9* 23.5*  NEUTROABS 7.1 12.5*  --   --   --   --   HGB 9.0* 8.8* 8.7* 8.6* 9.0* 9.0*  HCT 29.4* 28.4*  28.3* 28.0* 30.8* 29.9*  MCV 91.0 89.9 91.3 91.8 96.6 94.9  PLT 375 392 390 368 365 347     Basic Metabolic Panel: Recent Labs  Lab 07/03/21 0243 07/04/21 0244 07/05/21 0306 07/08/21 0447 07/08/21 1900 07/09/21 0239  NA 138 139 139 129* 131* 134*  K 4.4 4.3 4.0 4.6 5.2* 5.1  CL 98 98 98 90* 92* 95*  CO2 32 32 34* '30 29 28  '$ GLUCOSE 96 91 90 111* 82 81  BUN '12 14 14 21 '$ 26* 28*  CREATININE 0.75 0.76 0.76 1.45* 1.80* 1.93*  CALCIUM 8.9 8.9 9.1 8.6* 8.6* 8.6*  MG 2.0 1.9  --   --   --   --   PHOS 3.8 3.7  --   --   --   --      GFR: Estimated Creatinine Clearance: 32.4 mL/min (A) (by C-G formula based on SCr of 1.93 mg/dL (H)). Recent Labs  Lab 07/05/21 0306 07/06/21 0250 07/08/21 0440 07/08/21 0503 07/09/21 0239  WBC 16.5* 11.1* 17.9*  --  23.5*  LATICACIDVEN  --   --   --  0.8  --      Liver Function Tests: Recent Labs  Lab 07/03/21 0243 07/04/21 0244 07/08/21 0447  AST 11* 12* 13*  ALT '18 16 14  '$ ALKPHOS 60 69 92  BILITOT 0.8 0.8 0.5  PROT 5.4* 5.4* 5.4*  ALBUMIN 2.5* 2.6* 2.4*    No results for input(s): LIPASE, AMYLASE in the last 168 hours. Recent Labs  Lab 07/03/21 1254  AMMONIA 25     ABG    Component Value Date/Time   PHART 7.152 (LL) 07/08/2021 1829   PCO2ART 86.2 (HH) 07/08/2021 1829   PO2ART 77.5 (L) 07/08/2021 1829   HCO3 28.9 (H) 07/08/2021 1829   ACIDBASEDEF 0.7 07/08/2021 1829   O2SAT 92.9 07/08/2021 1829      Coagulation Profile: No results for input(s): INR, PROTIME in the last 168 hours.  Cardiac Enzymes: No results for input(s): CKTOTAL, CKMB, CKMBINDEX, TROPONINI in the last 168 hours.  HbA1C: Hgb A1c MFr Bld  Date/Time Value Ref Range Status  06/14/2021 05:01 PM 5.6 4.8 - 5.6 % Final    Comment:    (NOTE) Pre diabetes:          5.7%-6.4%  Diabetes:              >6.4%  Glycemic control for   <7.0% adults with diabetes     CBG: Recent Labs  Lab 07/06/21 1118 07/06/21 1542 07/06/21 2008 07/08/21 0442 07/08/21 1816  GLUCAP 142* 126* 92 103* 79     Review of Systems:   Unable to obtain due to intermittent encephalopathy  Past Medical History:  He,  has a past medical history of COPD (chronic obstructive pulmonary disease) ([place]).   Surgical History:   Past Surgical History:  Procedure Laterality Date   LAPAROTOMY N/A 06/28/2021   Procedure: EXPLORATORY LAPAROTOMY ABDOMINAL COLECTOMY WITH END ILEOSTOMY;  Surgeon: Michael Boston, MD;  Location: [facility] ORS;  Service: General;  Laterality: N/A;     Social History:   reports that he  has been smoking cigarettes. He has a 15.00 pack-year smoking history. He has never used smokeless tobacco. He reports that he does not currently use drugs after having used the following drugs: Marijuana. He reports that he does not drink alcohol.   Family History:  His family history is not on file.   Allergies Allergies  Allergen Reactions   Nsaids  Other (See Comments)    Elevated Cr      Home Medications  Prior to Admission medications   Medication Sig Start Date End Date Taking? Authorizing Provider  acetaminophen (TYLENOL) 500 MG tablet Take 500 mg by mouth every 6 (six) hours as needed for mild pain, fever or headache.   Yes [provider]  albuterol (VENTOLIN HFA) 108 (90 Base) MCG/ACT inhaler Inhale 1-2 puffs into the lungs every 6 (six) hours as needed for wheezing or shortness of breath.   Yes [provider]  alendronate (FOSAMAX) 70 MG tablet Take 70 mg by mouth once a week. Take with a full glass of water on an empty stomach.   Yes [provider]  amLODipine (NORVASC) 5 MG tablet Take 5 mg by mouth 2 (two) times daily.   Yes [provider]  aspirin EC 81 MG tablet Take 81 mg by mouth daily. Swallow whole.   Yes [provider]  atorvastatin (LIPITOR) 40 MG tablet Take 40 mg by mouth daily.   Yes [provider]  Cholecalciferol (VITAMIN D3) 50 MCG (2000 UT) capsule Take 2,000 Units by mouth daily.   Yes [provider]  clopidogrel (PLAVIX) 75 MG tablet Take 75 mg by mouth daily.   Yes [provider]  docusate sodium (COLACE) 100 MG capsule Take 100 mg by mouth 2 (two) times daily as needed for mild constipation.   Yes [provider]  ferrous sulfate 325 (65 FE) MG tablet Take 325 mg by mouth 3 (three) times a week. Monday, Wednesday, Friday   Yes [provider]  lisinopril (ZESTRIL) 20 MG tablet Take 20 mg by mouth in the morning and at bedtime.   Yes [provider]   metoprolol tartrate (LOPRESSOR) 25 MG tablet Take 12.5 mg by mouth 2 (two) times daily.   Yes [provider]  Tiotropium Bromide-Olodaterol (STIOLTO RESPIMAT) 2.5-2.5 MCG/ACT AERS Inhale 1 puff into the lungs daily.   Yes [provider]  vitamin C (ASCORBIC ACID) 500 MG tablet Take 500 mg by mouth daily.   Yes [provider]  XTANDI 40 MG tablet Take 160 mg by mouth daily. 03/06/21  Yes [provider]     Critical care time:     CRITICAL CARE Performed by: Lanier Clam   Total critical care time: 40 minutes  Critical care time was exclusive of separately billable procedures and treating other patients.  Critical care was necessary to treat or prevent imminent or life-threatening deterioration.  Critical care was time spent personally by me on the following activities: development of treatment plan with patient and/or surrogate as well as nursing, discussions with consultants, evaluation of patient's response to treatment, examination of patient, obtaining history from patient or surrogate, ordering and performing treatments and interventions, ordering and review of laboratory studies, ordering and review of radiographic studies, pulse oximetry and re-evaluation of patient's condition.

## 2021-07-09 NOTE — Progress Notes (Signed)
Briefly spoke to patients wife Daniel King about patient current status. He is currently stable, still wearing continuous BIPAP, still only responsive to pain, and continues to require a continuous drip for his blood pressure. She asked me how long he can stay at this stage, and I told her I don't really know since we have not tried to stop the BIPAP and his blood pressures have not tolerated discontinuation of medication. I informed her if we did this he could potentially decline quickly or he may remain unresponsive but stable. Her husbands comfort is her main priority at this time and I have informed her that he does not appear to be uncomfortable, nor are his vital signs showing signs of this. She is concerned because another team member will replace Dr. Silas Flood tomorrow and try to change the current goals of care that are in place. I told her that a palliative care consult has been placed, and that they should round on him tomorrow and they can speak about the plan for comfort moving forward. I have briefly explained to her the typical course of a patient who becomes comfort care, including discontinuation of BIPAP, the medication for his blood pressure, and adding an infusion of continuous pain medication. She seems to understand all of this and has told me once again that both children are on board with this plan. I have assured her that I would contact her if his status changes tonight, or if it appears that he becomes uncomfortable. Remains only a DNR for now.

## 2021-07-09 NOTE — Progress Notes (Signed)
Patient has been bladder scanned q8 hours during shift. Patient has had little UOP and bladder scan volume has been significantly less than 492m each time. This is not a new finding for this patient but I have notified e-link of finding so they are aware.

## 2021-07-09 NOTE — Progress Notes (Signed)
PT Cancellation Note  Patient Details Name: Daniel King MRN: [id number] DOB: 07-Aug-1946   Cancelled Treatment:    Reason Eval/Treat Not Completed: Medical issues which prohibited therapy, notes reviewed. On biPAP, continue to follow for any improvement for mobility.   Safiyyah Vasconez [place] Pager [phone] Office [phone]   07/09/2021, 7:03 AM

## 2021-07-10 DIAGNOSIS — K559 Vascular disorder of intestine, unspecified: Secondary | ICD-10-CM

## 2021-07-10 DIAGNOSIS — R627 Adult failure to thrive: Secondary | ICD-10-CM | POA: Diagnosis present

## 2021-07-10 DIAGNOSIS — J9601 Acute respiratory failure with hypoxia: Secondary | ICD-10-CM | POA: Diagnosis not present

## 2021-07-10 DIAGNOSIS — Z978 Presence of other specified devices: Secondary | ICD-10-CM

## 2021-07-10 DIAGNOSIS — Z515 Encounter for palliative care: Secondary | ICD-10-CM

## 2021-07-10 DIAGNOSIS — J9611 Chronic respiratory failure with hypoxia: Secondary | ICD-10-CM | POA: Diagnosis present

## 2021-07-10 DIAGNOSIS — J449 Chronic obstructive pulmonary disease, unspecified: Secondary | ICD-10-CM | POA: Diagnosis not present

## 2021-07-10 DIAGNOSIS — Z932 Ileostomy status: Secondary | ICD-10-CM

## 2021-07-10 DIAGNOSIS — R5381 Other malaise: Secondary | ICD-10-CM | POA: Diagnosis present

## 2021-07-10 DIAGNOSIS — N200 Calculus of kidney: Secondary | ICD-10-CM

## 2021-07-10 DIAGNOSIS — K631 Perforation of intestine (nontraumatic): Secondary | ICD-10-CM | POA: Diagnosis not present

## 2021-07-10 DIAGNOSIS — K6389 Other specified diseases of intestine: Secondary | ICD-10-CM

## 2021-07-10 LAB — BASIC METABOLIC PANEL
Anion gap: 12 (ref 5–15)
BUN: 45 mg/dL — ABNORMAL HIGH (ref 8–23)
CO2: 23 mmol/L (ref 22–32)
Calcium: 7.8 mg/dL — ABNORMAL LOW (ref 8.9–10.3)
Chloride: 94 mmol/L — ABNORMAL LOW (ref 98–111)
Creatinine, Ser: 2.56 mg/dL — ABNORMAL HIGH (ref 0.61–1.24)
GFR, Estimated: 25 mL/min — ABNORMAL LOW (ref >60)
Glucose, Bld: 76 mg/dL (ref 70–99)
Potassium: 5.7 mmol/L — ABNORMAL HIGH (ref 3.5–5.1)
Sodium: 129 mmol/L — ABNORMAL LOW (ref 135–145)

## 2021-07-10 LAB — CBC
HCT: 27.4 % — ABNORMAL LOW (ref 39.0–52.0)
Hemoglobin: 8.5 g/dL — ABNORMAL LOW (ref 13.0–17.0)
MCH: 28.3 pg (ref 26.0–34.0)
MCHC: 31 g/dL (ref 30.0–36.0)
MCV: 91.3 fL (ref 80.0–100.0)
Platelets: 274 10*3/uL (ref 150–400)
RBC: 3 MIL/uL — ABNORMAL LOW (ref 4.22–5.81)
RDW: 20.3 % — ABNORMAL HIGH (ref 11.5–15.5)
WBC: 21.9 10*3/uL — ABNORMAL HIGH (ref 4.0–10.5)
nRBC: 0.2 % (ref 0.0–0.2)

## 2021-07-10 LAB — MAGNESIUM: Magnesium: 1.9 mg/dL (ref 1.7–2.4)

## 2021-07-10 MED ORDER — SODIUM CHLORIDE 0.9 % IV SOLN
0.5000 mg/h | INTRAVENOUS | Status: DC
  Administered 2021-07-10: 0.5 mg/h via INTRAVENOUS
  Filled 2021-07-10: qty 5

## 2021-07-10 MED ORDER — HALOPERIDOL LACTATE 2 MG/ML PO CONC
0.5000 mg | ORAL | Status: DC | PRN
  Filled 2021-07-10: qty 0.3

## 2021-07-10 MED ORDER — HYDROMORPHONE BOLUS VIA INFUSION
1.0000 mg | INTRAVENOUS | Status: DC | PRN
  Filled 2021-07-10: qty 1

## 2021-07-10 MED ORDER — ACETAMINOPHEN 650 MG RE SUPP: 650.0000 mg | Freq: Four times a day (QID) | RECTAL | Status: DC | PRN

## 2021-07-10 MED ORDER — HALOPERIDOL LACTATE 5 MG/ML IJ SOLN: 1.0000 mg | INTRAMUSCULAR | Status: DC | PRN

## 2021-07-10 MED ORDER — HALOPERIDOL 1 MG PO TABS: 0.5000 mg | ORAL_TABLET | ORAL | Status: DC | PRN

## 2021-07-10 MED ORDER — GLYCOPYRROLATE 1 MG PO TABS: 1.0000 mg | ORAL_TABLET | ORAL | Status: DC | PRN

## 2021-07-10 MED ORDER — BIOTENE DRY MOUTH MT LIQD: 15.0000 mL | OROMUCOSAL | Status: DC | PRN

## 2021-07-10 MED ORDER — ACETAMINOPHEN 325 MG PO TABS: 650.0000 mg | ORAL_TABLET | Freq: Four times a day (QID) | ORAL | Status: DC | PRN

## 2021-07-10 MED ORDER — GLYCOPYRROLATE 0.2 MG/ML IJ SOLN: 0.2000 mg | INTRAMUSCULAR | Status: DC | PRN

## 2021-07-10 MED ORDER — POLYVINYL ALCOHOL 1.4 % OP SOLN: 1.0000 [drp] | Freq: Four times a day (QID) | OPHTHALMIC | Status: DC | PRN

## 2021-07-11 ENCOUNTER — Encounter: Payer: Self-pay | Admitting: Adult Health

## 2021-07-13 LAB — CULTURE, BLOOD (ROUTINE X 2)
Culture: NO GROWTH
Culture: NO GROWTH
Special Requests: ADEQUATE

## 2021-07-18 ENCOUNTER — Ambulatory Visit: Payer: Medicare Other | Admitting: Internal Medicine

## 2021-07-20 ENCOUNTER — Ambulatory Visit: Payer: Non-veteran care | Admitting: Hematology

## 2021-08-04 NOTE — Progress Notes (Signed)
Noted plans for transition to comfort care. General surgery will be available as needed but will not follow acutely. Please let us know if there is anything we can do for patient or family.   Norm Parcel, [facility] Surgery 08/01/2021, 4:04 PM Please see Amion for pager number during day hours 7:00am-4:30pm

## 2021-08-04 NOTE — Discharge Summary (Signed)
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[place])  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 07/01/2021  COPD (chronic obstructive pulmonary disease) (HCC)  Protein-calorie malnutrition, severe (HCC)  Acute respiratory failure with hypoxia (HCC)  Rectosigmoid cancer pT4a, pN1a  with obstruction s/o colectomy/ileostomy 06/21/2021  Failure to thrive in adult  Chronic respiratory failure with hypoxia (HCC)  Physical deconditioning  Pressure injury of skin  Physical deconditioning  Chronic respiratory failure with hypoxia and hypercapnia (HCC)  Anemia   Active Problems Principal Problem:   Rectosigmoid cancer pT4a, pN1a  with obstruction s/o colectomy/ileostomy 07/02/2021 Active Problems:   Perforated colon s/p colectomy/ileostomy 06/13/2021   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 06/08/2021   Protein-calorie malnutrition, severe (HCC)   Acute respiratory failure with hypoxia (HCC)   Comfort measures only status   DNR (do not resuscitate)   Terminal patient care   Chronic respiratory failure with hypoxia and hypercapnia (HCC)   Pressure injury of skin   Physical deconditioning   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HCC)   COPD (chronic obstructive pulmonary disease) ([place])   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[place])   Failure to thrive in adult   Chronic respiratory failure with hypoxia (HCC)   Physical deconditioning   Anemia   History of stroke   Supplemental oxygen dependent   Renal calculus, left   Comprehensive Problem List Patient Active Problem List   Diagnosis Date Noted   Comfort measures only status August 06, 2021    Priority: High    Class: Diagnosis of    Terminal patient care 06-Aug-2021    Priority: High   DNR (do not resuscitate) 07/08/2021    Priority: High    Class: Diagnosis of   Rectosigmoid cancer pT4a, pN1a  with obstruction s/o colectomy/ileostomy 06/21/2021 06/28/2021    Priority: High   Acute respiratory failure with hypoxia [facility])     Priority: High   Perforated colon s/p colectomy/ileostomy 06/08/2021 06/13/2021    Priority: High   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 07/02/2021 06/28/2021    Priority: High   Protein-calorie malnutrition, severe ([place]) 06/19/2021    Priority: High    Class: Present on Admission   Protein calorie malnutrition ([place]) 05/10/2021    Priority: High   Failure to thrive in adult 08-06-21    Priority: Medium    Class: Present on Admission   Chronic respiratory failure with hypoxia ([place]) Aug 06, 2021    Priority: Medium    Class: Chronic   Physical deconditioning 08-06-21    Priority: Medium    Class: Chronic   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[place]) 07/08/2021    Priority: Medium   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[place]) 06/08/2021    Priority: Medium   COPD (chronic obstructive pulmonary disease) ([place]) 06/18/2021    Priority: Medium   Anemia 06/21/2021    Priority: Medium    Class: Chronic   Physical deconditioning 05/10/2021    Priority: Medium   Chronic respiratory failure with hypoxia and hypercapnia ([place]) 04/17/2021    Priority: Medium   Pressure injury of skin 04/17/2021    Priority: Medium   Renal calculus, left 07/08/2021    Priority: Low   History of stroke 06/20/2021    Priority: Low  Supplemental oxygen dependent 06/23/2021    Priority: Low   COPD exacerbation ([place]) 04/17/2021   Pain in left knee 08/17/2019      SUMMARY Daniel King was 75 y.o. patient with    has a past medical history of AAA (abdominal aortic aneurysm) ([place]), Bladder cancer ([place]), COPD (chronic obstructive pulmonary disease) ([place]), and Hypertension.   has a past surgical  history that includes Abdominal aortic aneurysm repair; Prostatectomy; and laparotomy (N/A, 06/06/2021).   Admitted on 06/26/2021 with  75 year old man on day 16 of hospitalization following bowel perforation status post resection and ileostomy who is critically ill for many days status post extubation and transferred to the floor 2 days prior who returns with hypotension, encephalopathy, hypothermia, renal failure.   Seem to have been progressing.  Moving towards discharge.  He was seen out of the stepdown unit 2 days prior.  Lab seems stable.  Review of vital signs over the last 40 to 72 hours show a gradual decline in heart rate to sinus bradycardia in the 50s large of last 24 hours.  Slow decline in blood pressure from normotensive to borderline hypotensive with SBP's in the 90s to 100s but maps greater than 65.  BPs have been more like 130s over 80s 3 to 4 days prior.   Review of most recent chest erasers small to moderate bilateral pleural effusions.  Stable.  Notably had been on CT scan end of 06/2021.  On exam he is cool.  Upper extremity shows swelling.  He is hypothermic.  Creatinine is doubled overnight.  Worsening leukocytosis      EVENTS 8/19 admit with abd pain, CT with concern for perforation to OR for ex-lap with abdominal colectomy, colostomy. Returned to ICU on vent.  MRSA PCR positive COVID PCR negative Flu PCR negative Left IJ 8/19  Foley 8/19 > 8/22 ETT 8/19  >>8/22 06/24/21 - NAEON. UOP a bit better, still relatively oliguric. Cr stable. Alert, on SBT. Anticipate extubation. 06/25/21 : Failed SBT yesterday.  Lasix given.  Yesterday afebrile.  White count down to 11 point 9K.  Remains on ventilator 40% FiO2.  On fentanyl infusion, Precedex infusion, Levophed infusion off.  Only on vasopressin infusion.  Getting magnesium sulfate and potassium.  Remains on Zosyn.  Surgery noted that his oliguria is improving and creatinine normalized and to anticipate third space losses given  colectomy -extubated 8/23 remains extubated. Hypercapnic. Did not do much BiPAP due to NG tube. On amio gtt and rate controlled . This AM - agitated and confused and hallucinating despite precedex gtt. Wheezing. Chart review shows severe Protein cal mal[place] pre admit alb 2.,2. EKG now with NSR - QTc 511 mosec. On precedex, amio gtt, comparize prn.  Amiodarone discontinued due to prolonged QTC echocardiogram ordered.LVEF 60%.  Normal function.  Mild left ventricular hypertrophy RV function mildly reduced 8/24: Still confused.  Still on Precedex drip.  NG tube removed per surgery.  Modified barium swallow ordered. 8/25 Precedex stopped.  No longer confused.  Slowly advancing diet.  Barium swallow did demonstrate mild dysphagia.  Recommendations made by SLP.  Fairly hypertensive, Norvasc increased, as needed hydralazine. .  No longer needs critical care we will ask medicine to continue to provide support.  Internal medicine contacted and asked to assume primary consultant duties for medical needs, pulmonary following for specific pulmonary problems.  Given his advanced lung disease and malignancy we did ask palliative care to see the patient in consult to assist with goals of care  07/02/21: Intermittently wearing BiPAP - PCCM signed off 9/2 transferred out of SD unit 9/4 reconsult for hypotension 10. 9/6 Remains minimally responsive on continuous BiPAP. Per RT, if BiPAP is removed, O2 sats immediately plummet. Remains off of NE since 0300 this morning. Per PM RN, patient's wife would like to potentially transition to comfort care today. Palliative consulted. 11. Patient expired under comfort care status 07-21-2021      SIGNED Dr. Brand Males, M.D., F.C.C.P Pulmonary and Critical Care Medicine Staff Physician [place] [facility] Pager: [phone], If no answer or between  15:00h - 7:00h: call [phone]  07/13/2021 6:03 PM

## 2021-08-04 NOTE — Progress Notes (Signed)
Progress Note  18 Days Post-Op  Subjective: Patient with significant decline over the weekend. Now minimally responsive. VAC was replaced over the weekend. No family at bedside this AM, palliative consult pending.   Objective: Vital signs in last 24 hours: Temp:  [97.4 F (36.3 C)-99.2 F (37.3 C)] 97.4 F (36.3 C) (09/06 0800) Pulse Rate:  [72-92] 80 (09/06 0700) Resp:  [14-31] 30 (09/06 0700) BP: (78-138)/(36-103) 115/38 (09/06 0700) SpO2:  [91 %-100 %] 96 % (09/06 0718) FiO2 (%):  [35 %] 35 % (09/06 0718) Weight:  [75.2 kg] 75.2 kg (09/06 0500) Last BM Date: 07/09/21  Intake/Output from previous day: 09/05 0701 - 09/06 0700 In: 859.3 [I.V.:660.6; IV Piggyback:198.7] Out: 75 [Stool:75] Intake/Output this shift: No intake/output data recorded.  PE: Gen: not responsive, on BiPAP CV: RRR Pulm: BiPAP Abd: Soft, non-distended. Midline wound with VAC present; stoma viable but a little prolapsed Ext: SCDs in place   Lab Results:  Recent Labs    07/09/21 0239 22-Jul-2021 0734  WBC 23.5* 21.9*  HGB 9.0* 8.5*  HCT 29.9* 27.4*  PLT 347 274   BMET Recent Labs    07/09/21 0239 07/22/21 0734  NA 134* 129*  K 5.1 5.7*  CL 95* 94*  CO2 28 23  GLUCOSE 81 76  BUN 28* 45*  CREATININE 1.93* 2.56*  CALCIUM 8.6* 7.8*   PT/INR No results for input(s): LABPROT, INR in the last 72 hours. CMP     Component Value Date/Time   NA 129 (L) Jul 22, 2021 0734   K 5.7 (H) 2021/07/22 0734   CL 94 (L) July 22, 2021 0734   CO2 23 2021/07/22 0734   GLUCOSE 76 07/22/21 0734   BUN 45 (H) 07/22/21 0734   CREATININE 2.56 (H) 2021-07-22 0734   CALCIUM 7.8 (L) 2021-07-22 0734   PROT 5.4 (L) 07/08/2021 0447   ALBUMIN 2.4 (L) 07/08/2021 0447   AST 13 (L) 07/08/2021 0447   ALT 14 07/08/2021 0447   ALKPHOS 92 07/08/2021 0447   BILITOT 0.5 07/08/2021 0447   GFRNONAA 25 (L) 07/22/21 0734   Lipase  No results found for: LIPASE     Studies/Results: US RENAL  Result Date:  07/08/2021 CLINICAL DATA:  Renal failure, COPD, smoker EXAM: RENAL / URINARY TRACT ULTRASOUND COMPLETE COMPARISON:  CT abdomen and pelvis 07/05/2021 FINDINGS: Right Kidney: Renal measurements: 10.0 x 4.2 x 4.6 cm = volume: 100 mL. Normal cortical thickness and echogenicity. Small simple cyst at upper pole 19 x 13 x 15 mm. Additional simple cyst at inferior pole 18 x 16 x 19 mm. No solid mass or hydronephrosis. No shadowing calcification. Left Kidney: Renal measurements: 8.9 x 4.7 x 4.2 cm = volume: 92 mL. Cortical thinning. Borderline increased cortical echogenicity. Echogenic focus at mid kidney 3.7 cm diameter, corresponding to large pelvic calculus on prior CT. Exophytic cyst at upper pole 2.9 x 2.9 x 2.5 cm, containing scattered internal echoes which appear to be primarily artifacts. No hydronephrosis or additional mass. Bladder: Bladder decompressed. Other: Scattered ascites IMPRESSION: BILATERAL renal cysts. 3.7 cm diameter calculus at LEFT renal pelvis without hydronephrosis. No hydronephrosis identified. Scattered ascites. Electronically Signed   By: Lavonia Dana M.D.   On: 07/08/2021 12:52    Anti-infectives: Anti-infectives (From admission, onward)    Start     Dose/Rate Route Frequency Ordered Stop   07/09/21 1000  vancomycin (VANCOCIN) IVPB 1000 mg/200 mL premix  Status:  Discontinued        1,000 mg 200 mL/hr over  60 Minutes Intravenous Every 24 hours 07/08/21 0859 07/09/21 1042   07/08/21 1000  ceFEPIme (MAXIPIME) 2 g in dextrose 5 % 50 mL IVPB  Status:  Discontinued        2 g 100 mL/hr over 30 Minutes Intravenous Every 12 hours 07/08/21 0816 07/08/21 0824   07/08/21 1000  ceFEPIme (MAXIPIME) 2 g in sodium chloride 0.9 % 100 mL IVPB        2 g 200 mL/hr over 30 Minutes Intravenous Every 12 hours 07/08/21 0824     07/08/21 1000  vancomycin (VANCOREADY) IVPB 1500 mg/300 mL        1,500 mg 150 mL/hr over 120 Minutes Intravenous  Once 07/08/21 0859 07/08/21 1318   06/05/2021 1400   piperacillin-tazobactam (ZOSYN) IVPB 3.375 g        3.375 g 12.5 mL/hr over 240 Minutes Intravenous Every 8 hours 06/26/2021 1357 06/27/21 1016   06/07/2021 0745  cefoTEtan (CEFOTAN) 2 g in sodium chloride 0.9 % 100 mL IVPB        2 g 200 mL/hr over 30 Minutes Intravenous On call to O.R. 06/14/2021 0740 06/07/2021 0831   06/06/2021 0745  sodium chloride 0.9 % with cefoTEtan (CEFOTAN) ADS Med       Note to Pharmacy: Charmayne Sheer   : cabinet override      07/03/2021 0745 06/29/2021 0854   06/27/2021 0700  piperacillin-tazobactam (ZOSYN) IVPB 3.375 g        3.375 g 100 mL/hr over 30 Minutes Intravenous  Once 06/28/2021 0653 06/29/2021 0733        Assessment/Plan POD18 Ex-lap with TAC and end ileostomy Dr. Johney Maine 06/21/2021 - pt was tolerating diet and having ileostomy output - now minimally responsive - VAC replaced over weekend - continue to mobilize as tolerated - PT/OT recommending SNF at this time - pathology: T4N1 obstructing rectosigmoid cancer.  Dr. Johney Maine has initiated arrangements for GI tumor board and outpatient oncology follow-up. Patient and family are aware  - CT A/P 9/1 with some ascites and anasarca, no evidence of abscess or post-operative complication Urinary retention - foley placed 8/31, removed 9/2 and now has external collecting system in place Jerking movements - EEG negative for seizure activity and MRI brain negative for acute process, No further episodes    FEN: sips with meds  VTE: SQ heparin, plavix ID: no current abx; WBC 23 without clear identified source - if not transitioning to comfort care would consider repeat CT AP    Dispo: Patient with significant decline over the weekend. Palliative consult pending but it seems likely that family will elect to transition to comfort care. General surgery will remain available    Chronic hypoxic and hypercarbic respiratory failure in setting of advanced COPD and current tobacco abuse - per CCM/TRH, appreciate assistance  Acute metabolic  encephalopathy - patient now only responsive to painful stimuli  A. Fib with RVR and prolonged QTc HTN ABL anemia on likely anemia of chronic disease GERD Hx of bladder cancer stage IV HLD Hx of CVA Oct 2021 Severe protein calorie malnutrition  Dysphagia  FTT  LOS: 18 days    Norm Parcel, [facility] 2021/07/18, 8:57 AM Please see Amion for pager number during day hours 7:00am-4:30pm

## 2021-08-04 NOTE — Progress Notes (Signed)
Occupational Therapy Discharge Patient Details Name: Daniel King MRN: [id number] DOB: Jun 07, 1946 Today's Date: 2021-08-05 Time:  -     Patient discharged from OT services secondary to medical decline - Per chart review appears to be going comfort care.   Please see latest therapy progress note for current level of functioning and progress toward goals.    Delbert Phenix OT OT pager: [place] 2021-08-05, 7:17 AM

## 2021-08-04 NOTE — Progress Notes (Signed)
Nutrition Follow-up  DOCUMENTATION CODES:   Non-severe (moderate) malnutrition in context of chronic illness  INTERVENTION:  - will monitor for GOC.    NUTRITION DIAGNOSIS:   Moderate Malnutrition related to chronic illness (COPD) as evidenced by mild fat depletion, mild muscle depletion, moderate muscle depletion. -ongoing  GOAL:   Patient will meet greater than or equal to 90% of their needs -unable to meet  MONITOR:   Diet advancement, Labs, Weight trends, Skin  ASSESSMENT:   75 year-old male with medical history of COPD. He presented to the ED due to worsening abdominal pain. CT showed diffuse dilated colon with pneumatosis consistent with colon obstruction with ischemic perforation.  Patient last assessed by a RD on 8/31. Patient has been NPO since 9/4 AM and is requiring BiPAP support.   Palliative Care consult pending for GOC. Notes from other disciplines indicate that high likelihood of transition to comfort care.   Weight has been fluctuating near daily since admission on 8/19. Non-pitting edema to BUE and mild pitting edema to BLE documented in the edema section of flow sheet. He is note to be +1.02 L since admission.   Labs reviewed; Na: 129 mmol/l, K: 5.7 mmol/l, Cl: 94 mmol/l, BUN: 45 mg/dl, creatinine: 2.56 mg/dl, Ca: 7.8 mg/dl, GFR: 25 ml/min.  Medications reviewed; 1 mg folvite/day, 1 tablet multivitamin with minerals/day, 40 mg IV protonix/day, 100 mg oral thiamine/day.     Diet Order:   Diet Order             Diet NPO time specified Except for: Sips with Meds  Diet effective now                   EDUCATION NEEDS:   No education needs have been identified at this time  Skin:  Skin Assessment: Skin Integrity Issues: Skin Integrity Issues:: DTI, Unstageable DTI: coccyx (8/22) Unstageable: full thickness to sacrum (newly documented 9/4) Incisions: abdomen (8/19)  Last BM:  9/6 (type 7 x1)  Height:   Ht Readings from Last 1 Encounters:   07/08/21 6' (1.829 m)    Weight:   Wt Readings from Last 1 Encounters:  07-13-21 75.2 kg     Estimated Nutritional Needs:  Kcal:  1995-2190 kcal Protein:  105-120 grams Fluid:  >/= 2 L/day     Jarome Matin, MS, RD, LDN, CNSC Inpatient Clinical Dietitian RD pager # available in [place]  After hours/weekend pager # available in [facility]

## 2021-08-04 NOTE — Progress Notes (Signed)
Daily Progress Note   Patient Name: Daniel King       Date: 2021/07/22 DOB: 1946/07/13  Age: 75 y.o. MRN#: [id number] Attending Physician: Daniel Males, MD Primary Care Physician: Daniel Cea, MD Admit Date: 06/08/2021  Reason for Consultation/Follow-up: Establishing goals of care  Subjective: Chart reviewed including personal review of pertinent labs and imaging.  Discussed with bedside care team including bedside RN as well as Daniel King from [facility].  I saw and examined Daniel King today.  He is wearing BiPAP and not responsive.  I called and spoke with patient's wife, Daniel King.    We discussed clinical course as well as wishes moving forward in regard to  care plan this hospitalization discussed.    Daniel King then clearly stated that she and Daniel King children understand that he is dying.  She spent the day with him yesterday and tells me that he woke up twice through the entire day.  One time was just opening his eyes and the other time he opened his eyes and slightly nodded.  She is emotionally overwhelmed as she has been diligently at his bedside every day for the past 18 days of his admission.  She tells me that she made peace as best she could with the fact he is dying and said goodbye to them last night.  She is not planning on coming back to the hospital today as she has already told him once goodbye and thinks it will be too difficult to do so again.  We discussed difference between a aggressive medical intervention path and a palliative, comfort focused care path.  Daniel King had very appropriate questions regarding medications for symptoms and how staff will be assessing if he is comfortable if he is not able to wake up and tell us.  I provided anticipatory guidance on both  symptom management and signs and symptoms of distress in dying people.  She tells me that he has a son in town and a daughter in [place].  They have been discussing a plan to transition to comfort and his children are not planning to visit prior to his death.   Questions and concerns addressed.    Plan for comfort discussed with bedside RN and Daniel King from [facility].   PMT will continue to support holistically.  Length of Stay: 18  Current Medications: Scheduled Meds:  . Chlorhexidine Gluconate Cloth  6 each Topical Daily  . lip balm  1 application Topical BID    Continuous Infusions: . sodium chloride 10 mL/hr at Aug 05, 2021 1300  . HYDROmorphone      PRN Meds: acetaminophen **OR** acetaminophen, antiseptic oral rinse, glycopyrrolate **OR** glycopyrrolate **OR** glycopyrrolate, haloperidol **OR** haloperidol **OR** haloperidol lactate, HYDROmorphone, ondansetron (ZOFRAN) IV, polyvinyl alcohol  Physical Exam      General: Somnolent, no distress.  On BiPAP.   HEENT: No bruits, no goiter, no JVD Heart: Regular rate and rhythm. No murmur appreciated. Lungs: Decreased air movement, scattered coarse Abdomen: Soft, nontender, nondistended, positive bowel sounds.   Ext: + edema Skin: Warm and dry Neuro: Does not arouse with gentle verbal or tactile stimulation      Vital Signs: BP (!) 135/55   Pulse 92   Temp 97.7 F (36.5 C) (Axillary)   Resp (!) 30   Ht 6' (1.829 m)   Wt 75.2 kg   SpO2 (!) 89%   BMI 22.48 kg/m  SpO2: SpO2: (!) 89 % O2 Device: O2 Device: Bi-PAP O2 Flow Rate: O2 Flow Rate (L/min): 2 L/min  Intake/output summary:  Intake/Output Summary (Last 24 hours) at 2021/08/05 1445 Last data filed at 2021-08-05 1300 Gross per 24 hour  Intake 761.22 ml  Output 75 ml  Net 686.22 ml   LBM: Last BM Date: 2021-08-05 Baseline Weight: Weight: 68 kg Most recent weight: Weight: 75.2 kg       Palliative Assessment/Data:    Flowsheet Rows    Flowsheet Row Most Recent  Value  Intake Tab   Referral Department Hospitalist  Unit at Time of Referral ICU  Palliative Care Primary Diagnosis Cancer  Date Notified 06/28/21  Palliative Care Type Return patient Palliative Care  Reason for referral Clarify Goals of Care  Date of Admission 06/04/2021  Date first seen by Palliative Care 06/29/21  # of days Palliative referral response time 1 Day(s)  # of days IP prior to Palliative referral 6  Clinical Assessment   Palliative Performance Scale Score 40%  Psychosocial & Spiritual Assessment   Palliative Care Outcomes   Patient/Family meeting held? Yes  Who was at the meeting? Patient       Patient Active Problem List   Diagnosis Date Noted  . Renal calculus, left 07/08/2021  . Ileostomy in place [facility]) 07/08/2021  . Rectosigmoid cancer pT4a, pN1a  with obstruction s/o colectomy/ileostomy 06/23/2021 06/28/2021  . Acute respiratory failure with hypoxia ([place])   . Perforated colon s/p colectomy/ileostomy 07/03/2021 06/14/2021  . 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[place]) 06/29/2021  . 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 History of stroke 06/10/2021  . Supplemental oxygen dependent 06/09/2021  . COPD (chronic obstructive pulmonary disease) ([place]) 06/26/2021  . Malnutrition of moderate degree 06/17/2021    Palliative Care Assessment & Plan   Patient Profile: 75 year old male with a long complicated medical course including colon obstruction with ischemic perforation related to obstructing rectosigmoid cancer.  His course has worsened through the weekend he is currently BiPAP dependent but unresponsive.  Wife has expressed desire to transition to comfort care.  Recommendations/Plan: Plan for full comfort moving forward Pain/shortness of breath: Dilaudid infusion with as needed boluses as needed for breakthrough symptoms.  Once Dilaudid has a chance to reach steady state, will work to wean off of BiPAP. Anxiety: Ativan  as needed Agitation: Haldol as needed Excess secretions: Robinul as needed Discussed with wife  limited prognosis and likely life expectancy of hours to a very limited number of days once transition is made to full comfort.  She expressed understanding and is not planning on coming back to the hospital. Anticipate hospital death.  Goals of Care and Additional Recommendations: Limitations on Scope of Treatment: Full Comfort Care  Code Status:    Code Status Orders  (From admission, onward)           Start     Ordered   Aug 09, 2021 1432  Do not attempt resuscitation (DNR)  Continuous       Question Answer Comment  In the event of cardiac or respiratory ARREST Do not call a "code blue"   In the event of cardiac or respiratory ARREST Do not perform Intubation, CPR, defibrillation or ACLS   In the event of cardiac or respiratory ARREST Use medication by any route, position, wound care, and other measures to relive pain and suffering. May use oxygen, suction and manual treatment of airway obstruction as needed for comfort.      08/09/21 1434           Code Status History     Date Active Date Inactive Code Status Order ID Comments User Context   07/08/2021 1911 09-Aug-2021 1434 DNR [id number]  Lanier Clam, MD Inpatient   06/15/2021 1357 07/08/2021 1911 Full Code [id number]  Michael Boston, MD Inpatient       Prognosis:  Hours - Days  Discharge Planning: Anticipated Hospital Death  Care plan was discussed with wife, RN, bedside care team, PCCM  Thank you for allowing the Palliative Medicine Team to assist in the care of this patient.   Time In: 1400 Time Out: 1450 Total Time 50 Prolonged Time Billed No      Greater than 50%  of this time was spent counseling and coordinating care related to the above assessment and plan.  Micheline Rough, MD  Please contact Palliative Medicine Team phone at [phone] for questions and concerns.

## 2021-08-04 NOTE — Progress Notes (Signed)
This RN wasted 48m of Hydromorphone (Dilaudid) 0.'5mg'$ /mL infusion with MFrances Furbish RN. It was placed in the stericycle.

## 2021-08-04 NOTE — Progress Notes (Signed)
Physical Therapy Discharge Patient Details Name: Daniel King MRN: [id number] DOB: 28-Nov-1945 Today's Date: 2021-07-30 Time:  -     Patient discharged from PT services secondary to medical decline - Appears to be going forward for comfort care.-will need to re-order PT to resume therapy services.  Please see latest therapy progress note for current level of functioning and progress toward goals.    Progress and discharge plan discussed with patient and/or caregiver: Patient unable to participate in discharge planning and no caregivers available  GP     Claretha Cooper 2021/07/30, 7:13 AM [place] Pager [phone] Office [phone]

## 2021-08-04 NOTE — Progress Notes (Signed)
SLP DC Note  Patient Details Name: Aleks Lilja MRN: [id number] DOB: 16-Mar-1946   Cancelled treatment:       Reason Eval/Treat Not Completed: Medical issues which prohibited therapy;Other (comment) (Pt transitioning to comfort care per chart review, please reorder if indicated/desire. Thank you.)  [name], MS [facility] SLP Acute Rehab Services Office [phone] Pager [phone]  Macario Golds 08/03/21, 7:58 AM

## 2021-08-04 NOTE — Progress Notes (Signed)
Patient passed away peacefully at [place] with this RN along with another RN at bedside. His wife Daniel King was notified and told this RN she will call tomorrow with the name of the funeral home the patient will be transferred to. [facility] was called.

## 2021-08-04 NOTE — Progress Notes (Addendum)
NAME:  Daniel King, MRN:  [id number] DOB:  1946/03/20, LOS: 71 ADMISSION DATE:  06/21/2021 CONSULTATION DATE:  07-30-2021 REFERRING MD: TRH  CHIEF COMPLAINT: Hypotension   History of Present Illness:  75 year old man with PMHx significant for COPD, RV dysfunction, new diagnosis colon cancer after bowel perforation and resection earlier this hospitalization (06/2021) admitted to [facility] 8/19. He is now hospital day 17 and has been critically ill for many days. S/p extubation with transfer from ICU to the floor 9/3. Patient returned to ICU 9/5 with hypotension, encephalopathy, hypothermia and renal failure.  Patient was initially progressing and moving towards discharge with stable clinical status and labs until 2 days prior to consult (9/3) when patient began to show signs of clinical decline. Review of vital signs over the 48 to 72 hours prior to ICU readmission show a gradual decline in heart rate to sinus bradycardia in the 50s, slow decline in blood pressure from normotensive to borderline hypotensive with SBPs in the 90s to 100s (MAPs greater than 65) and hypothermia with temperature measured as low as 92.80F. Review of most recent CXR demonstrated small to moderate bilateral pleural effusions, which were stable and present on CT scan end of 06/2021.  PCCM reconsulted in the setting of need for ICU transfer, new hypotension/hypothermia and new renal failure.  Pertinent Medical History:  COPD, RV dysfunction, new diagnosis colon cancer after bowel perforation and resection earlier this hospitalization 06/2021  Significant Hospital Events: Including procedures, antibiotic start and stop dates in addition to other pertinent events   8/19 Admit with abd pain, CT with concern for perforation to OR for ex-lap with abdominal colectomy, colostomy. Returned to ICU on vent. MRSA PCR positive. COVID PCR negative. Flu PCR negative. Left IJ placed 8/19. Foley 8/19 > 8/22. ETT 8/19  >>8/22 06/24/21 - NAEON. UOP a  bit better, still relatively oliguric. Cr stable. Alert, on SBT. Anticipate extubation 06/25/21 Failed SBT yesterday.  Lasix given.  Yesterday afebrile.  White count down to 11 point 9K.  Remains on ventilator 40% FiO2.  On fentanyl infusion, Precedex infusion, Levophed infusion off.  Only on vasopressin infusion.  Getting magnesium sulfate and potassium.  Remains on Zosyn.  Surgery noted that his oliguria is improving and creatinine normalized and to anticipate third space losses given colectomy -extubated 8/23 remains extubated. Hypercapnic. Did not do much BiPAP due to NG tube. On amio gtt and rate controlled . This AM - agitated and confused and hallucinating despite precedex gtt. Wheezing. Chart review shows severe Protein cal malnutn pre admit alb 2.,2. EKG now with NSR - QTc 511 mosec. On precedex, amio gtt, comparize prn.  Amiodarone discontinued due to prolonged QTC echocardiogram ordered.LVEF 60%.  Normal function.  Mild left ventricular hypertrophy RV function mildly reduced 8/24 Still confused.  Still on Precedex drip.  NG tube removed per surgery.  Modified barium swallow ordered. 8/25 Precedex stopped.  No longer confused.  Slowly advancing diet.  Barium swallow did demonstrate mild dysphagia.  Recommendations made by SLP.  Fairly hypertensive, Norvasc increased, as needed hydralazine. .  No longer needs critical care we will ask medicine to continue to provide support.  Internal medicine contacted and asked to assume primary consultant duties for medical needs, pulmonary following for specific pulmonary problems.  Given his advanced lung disease and malignancy we did ask palliative care to see the patient in consult to assist with goals of care 07/02/21: Intermittently wearing BiPAP - PCCM signed off 9/2 transferred out of [facility] unit 9/4  reconsult for hypotension, encephalopathic, hypothermic, leukocytosis concern for septic shock, UA dirty, worsening CO2 retention, placed on AVAPS, made DNR 9/6  Remains minimally responsive on continuous BiPAP. Per RT, if BiPAP is removed, O2 sats immediately plummet. Remains off of NE since 0300 this morning. Per PM RN, patient's wife would like to potentially transition to comfort care today. Palliative consulted.  Interim History / Subjective:  CO2 retention, on AVAPS, improvement in encephalopathy mildly but overall no change, very weak, do not anticipate much recovery  Objective   Blood pressure (!) 105/49, pulse 84, temperature (!) 97.4 F (36.3 C), temperature source Axillary, resp. rate (!) 22, height 6' (1.829 m), weight 75.2 kg, SpO2 93 %.    FiO2 (%):  [35 %] 35 %   Intake/Output Summary (Last 24 hours) at July 15, 2021 0933 Last data filed at Jul 15, 2021 0700 Gross per 24 hour  Intake 859.34 ml  Output 75 ml  Net 784.34 ml    Filed Weights   07/08/21 0500 07/08/21 0800 07/15/21 0500  Weight: 73.3 kg 69.3 kg 75.2 kg   Physical Examination: General: Chronically ill-appearing elderly man in NAD. HEENT: [place]/AT, anicteric sclera, PERRL, moist mucous membranes. Neuro: Lethargic. Seems to attempt to open eyes with verbal/tactile stimulation. Not following commands. No witnessed spontaneous movement of extremities. CV: RRR, no m/g/r. PULM: Breathing even and minimally labored on continuous BiPAP. Lung fields diminished/distant, coarse upper airway sounds. GI: Soft, nontender, nondistended. Normoactive bowel sounds. Extremities: Bilateral symmetric 2+ UE edema noted, 1+ BLE noted. Skin: Cool/dry, no rashes. Multiple areas of ecchymosis to bilateral forearms.  Resolved Hospital Problem List:    Assessment & Plan:  Undifferentiated shock, presumed septic Presumed septic shock c/b encephalopathy, renal failure.  Hypothermic, worsening leukocytosis.  Unclear source. Possible pneumonia given secretions, requiring continuous BiPAP. Blood cultures, urine culture, UA obtained.  Broad-spectrum antibiotics initiated. Ongoing gentle fluid resuscitation.   Norepinephrine to maintain blood pressure.  Notable RV dysfunction on recent Echo with normal left-sided EF. - Continue fluid resuscitation as tolerated - Goal MAP remains > 65 - Continue NE for goal MAP - Continue cefepime - F/u finalized Cx data  Toxic metabolic encephalopathy Presumed due to severe sepsis.  Worsened with CO2 retention PM 9/4.  Worsened with renal failure. - Continue cefepime for presumed sepsis - Could consider transition to non-cephalosporin class agent to rule this out as a contributing factor to encephalopathy; however, confusion preceded antibiotic start  Acute renal failure, likely in the setting of sepsis/MSOF Creatinine rising, urine output marginal.  Bladder scan with very little urine.  Suspect related to hypotension on the floor in the preceding day not well documented.  Possible prebladder obstruction but will have to be bilateral so unlikely.  Notably, has been on scheduled low-dose ibuprofen. Renal US unremarkable. - Trend BMP - Replete electrolytes as indicated - Monitor I&Os - Avoid nephrotoxic agents as able, including NSAIDs - Ensure adequate renal perfusion  Volume overload Suspect third spacing in the setting of poor nutrition after significant bowel surgery.  - Monitor I&Os, net volume status - Cannot diurese at present due to new onset of renal failure - BiPAP for respiratory support  COPD, present on admission retaining CO2 in setting of encephalopathy due to sepsis.  CO2 narcosis in the afternoon 9/4.  Mild improvement in encephalopathy with improvement in CO2, on AVAPS.  DNR. - Continue nebulized therapies - AVAPS  Bowel perforation in the setting of new diagnosis of colon cancer, POA S/p resection 06/2021 with prolonged hospital course. -  Surgery following  Goals of care discussion Comfort care Patient's prognosis for recovery remains very poor, given ongoing evidence of MSOF with little improvement despite multiple interventions. Per  RN note 9/5PM and 9/6AM, patient's wife understands his severely ill clinical status and seems to wish to focus on her husband's comfort at this point. Palliative care has been consulted. - F/u Palliative care recommendation - Discussed with Dr. Domingo Cocking (Palliative Care) and wife, she would like to proceed with transition to comfort care today, 9/6  Best Practice: (right click and "Reselect all SmartList Selections" daily)   Diet/type: NPO w/ oral meds DVT prophylaxis: prophylactic heparin  GI prophylaxis: N/A Lines: N/A Foley:  N/A Code Status: Comfort care  Critical care time: 30 minutes   Lestine Mount, PA-C [facility] 2021/08/08 10:11 AM  Please see [email or web address] for pager details.  From 7A-7P if no response, please call [phone] After hours, please call ELink [phone]

## 2021-08-04 NOTE — Progress Notes (Signed)
Patients wife called again this morning at 0645 to ask for an update on her husband. She also reinforced the fact that she wants him to be comfortable and that she  does not wish to come up here today as yesterday was really hard on her. I told her that we would not be stopping any of the current care we are providing until she tells [place] to, and will have the nurse ask the provider to give the wife a call at their earliest convenience. It sounds like she wishes to still make him comfort care today.

## 2021-08-04 DEATH — deceased
